# Patient Record
Sex: Female | Born: 1965 | Race: White | Hispanic: No | Marital: Married | Smoking: Former smoker
Health system: Southern US, Community
[De-identification: ages and names within clinical notes are randomized; demographics above are authoritative.]

## PROBLEM LIST (undated history)

## (undated) DIAGNOSIS — I10 Essential (primary) hypertension: Secondary | ICD-10-CM

## (undated) DIAGNOSIS — F32A Depression, unspecified: Secondary | ICD-10-CM

## (undated) DIAGNOSIS — E119 Type 2 diabetes mellitus without complications: Secondary | ICD-10-CM

## (undated) DIAGNOSIS — K219 Gastro-esophageal reflux disease without esophagitis: Secondary | ICD-10-CM

## (undated) DIAGNOSIS — N951 Menopausal and female climacteric states: Secondary | ICD-10-CM

## (undated) DIAGNOSIS — K859 Acute pancreatitis without necrosis or infection, unspecified: Secondary | ICD-10-CM

## (undated) DIAGNOSIS — M1990 Unspecified osteoarthritis, unspecified site: Secondary | ICD-10-CM

## (undated) DIAGNOSIS — F419 Anxiety disorder, unspecified: Secondary | ICD-10-CM

## (undated) DIAGNOSIS — R350 Frequency of micturition: Secondary | ICD-10-CM

## (undated) DIAGNOSIS — N3946 Mixed incontinence: Secondary | ICD-10-CM

## (undated) HISTORY — PX: APPENDECTOMY: SHX54

## (undated) HISTORY — PX: NECK SURGERY: SHX720

## (undated) HISTORY — PX: CHOLECYSTECTOMY: SHX55

## (undated) HISTORY — DX: Anxiety disorder, unspecified: F41.9

## (undated) HISTORY — PX: ABDOMINAL HYSTERECTOMY: SHX81

## (undated) HISTORY — DX: Menopausal and female climacteric states: N95.1

## (undated) HISTORY — DX: Frequency of micturition: R35.0

## (undated) HISTORY — DX: Mixed incontinence: N39.46

## (undated) HISTORY — PX: OTHER SURGICAL HISTORY: SHX169

---

## 1997-11-17 ENCOUNTER — Emergency Department (HOSPITAL_COMMUNITY): Admission: EM | Admit: 1997-11-17 | Discharge: 1997-11-17 | Payer: Self-pay

## 1997-11-20 ENCOUNTER — Encounter: Admission: RE | Admit: 1997-11-20 | Discharge: 1998-02-08 | Payer: Self-pay | Admitting: *Deleted

## 1997-12-15 ENCOUNTER — Encounter: Admission: RE | Admit: 1997-12-15 | Discharge: 1998-01-05 | Payer: Self-pay | Admitting: *Deleted

## 2001-04-16 ENCOUNTER — Emergency Department (HOSPITAL_COMMUNITY): Admission: EM | Admit: 2001-04-16 | Discharge: 2001-04-16 | Payer: Self-pay | Admitting: *Deleted

## 2001-04-16 ENCOUNTER — Encounter: Payer: Self-pay | Admitting: *Deleted

## 2001-04-19 ENCOUNTER — Inpatient Hospital Stay (HOSPITAL_COMMUNITY): Admission: RE | Admit: 2001-04-19 | Discharge: 2001-04-21 | Payer: Self-pay | Admitting: Pediatrics

## 2001-04-19 ENCOUNTER — Encounter: Payer: Self-pay | Admitting: General Surgery

## 2001-04-19 ENCOUNTER — Ambulatory Visit (HOSPITAL_COMMUNITY): Admission: RE | Admit: 2001-04-19 | Discharge: 2001-04-19 | Payer: Self-pay | Admitting: General Surgery

## 2001-05-17 ENCOUNTER — Observation Stay (HOSPITAL_COMMUNITY): Admission: RE | Admit: 2001-05-17 | Discharge: 2001-05-18 | Payer: Self-pay | Admitting: General Surgery

## 2001-07-01 ENCOUNTER — Other Ambulatory Visit: Admission: RE | Admit: 2001-07-01 | Discharge: 2001-07-01 | Payer: Self-pay | Admitting: General Surgery

## 2005-03-23 ENCOUNTER — Emergency Department (HOSPITAL_COMMUNITY): Admission: EM | Admit: 2005-03-23 | Discharge: 2005-03-23 | Payer: Self-pay | Admitting: Emergency Medicine

## 2005-03-24 ENCOUNTER — Ambulatory Visit: Payer: Self-pay | Admitting: Orthopedic Surgery

## 2005-04-07 ENCOUNTER — Ambulatory Visit: Payer: Self-pay | Admitting: Orthopedic Surgery

## 2005-05-08 ENCOUNTER — Ambulatory Visit: Payer: Self-pay | Admitting: Orthopedic Surgery

## 2005-06-11 ENCOUNTER — Ambulatory Visit: Payer: Self-pay | Admitting: Orthopedic Surgery

## 2007-03-12 ENCOUNTER — Ambulatory Visit (HOSPITAL_COMMUNITY): Admission: RE | Admit: 2007-03-12 | Discharge: 2007-03-12 | Payer: Self-pay | Admitting: Family Medicine

## 2007-03-17 ENCOUNTER — Ambulatory Visit (HOSPITAL_COMMUNITY): Admission: RE | Admit: 2007-03-17 | Discharge: 2007-03-17 | Payer: Self-pay | Admitting: Internal Medicine

## 2007-07-13 ENCOUNTER — Encounter (INDEPENDENT_AMBULATORY_CARE_PROVIDER_SITE_OTHER): Payer: Self-pay | Admitting: Interventional Radiology

## 2007-07-13 ENCOUNTER — Encounter: Admission: RE | Admit: 2007-07-13 | Discharge: 2007-07-13 | Payer: Self-pay | Admitting: Endocrinology

## 2007-07-13 ENCOUNTER — Other Ambulatory Visit: Admission: RE | Admit: 2007-07-13 | Discharge: 2007-07-13 | Payer: Self-pay | Admitting: Interventional Radiology

## 2008-05-05 ENCOUNTER — Emergency Department (HOSPITAL_COMMUNITY): Admission: EM | Admit: 2008-05-05 | Discharge: 2008-05-05 | Payer: Self-pay | Admitting: Emergency Medicine

## 2009-06-23 ENCOUNTER — Emergency Department (HOSPITAL_COMMUNITY): Admission: EM | Admit: 2009-06-23 | Discharge: 2009-06-23 | Payer: Self-pay | Admitting: Emergency Medicine

## 2010-04-15 LAB — URINALYSIS, ROUTINE W REFLEX MICROSCOPIC
Glucose, UA: NEGATIVE mg/dL
Protein, ur: NEGATIVE mg/dL
Specific Gravity, Urine: 1.01 (ref 1.005–1.030)
Urobilinogen, UA: 0.2 mg/dL (ref 0.0–1.0)

## 2010-04-15 LAB — CBC
HCT: 37.2 % (ref 36.0–46.0)
Hemoglobin: 13.2 g/dL (ref 12.0–15.0)
MCHC: 35.6 g/dL (ref 30.0–36.0)
MCV: 86 fL (ref 78.0–100.0)
Platelets: 216 10*3/uL (ref 150–400)
RDW: 14 % (ref 11.5–15.5)

## 2010-04-15 LAB — DIFFERENTIAL
Basophils Relative: 0 % (ref 0–1)
Lymphocytes Relative: 16 % (ref 12–46)
Monocytes Relative: 4 % (ref 3–12)
Neutro Abs: 8.5 10*3/uL — ABNORMAL HIGH (ref 1.7–7.7)
Neutrophils Relative %: 78 % — ABNORMAL HIGH (ref 43–77)

## 2010-04-15 LAB — COMPREHENSIVE METABOLIC PANEL
Alkaline Phosphatase: 80 U/L (ref 39–117)
BUN: 14 mg/dL (ref 6–23)
Calcium: 9.7 mg/dL (ref 8.4–10.5)
Creatinine, Ser: 0.99 mg/dL (ref 0.4–1.2)
Glucose, Bld: 139 mg/dL — ABNORMAL HIGH (ref 70–99)
Potassium: 3.8 mEq/L (ref 3.5–5.1)
Total Protein: 7.1 g/dL (ref 6.0–8.3)

## 2010-06-14 NOTE — Discharge Summary (Signed)
[facility]  Patient:    Kelli Thompson, Kelli Thompson Visit Number: [id number] MRN: [id number]          Service Type: OBV Location: 3A A327 01 Attending Physician:  Barbaraann Barthel Dictated by:   Barbaraann Barthel, M.D. Admit Date:  05/17/2001 Discharge Date: 05/18/2001   CC:         [name], M.D.   Discharge Summary  DIAGNOSIS:  Acute appendicitis.  PROCEDURE:  April 19, 2001 appendectomy.  SUMMARY:  This is a 45 year old white female who had a history of abdominal pain. She was seen in the emergency room with pain localized in the epigastrium. She had a history of cholelithiasis. She was presumed to have cholelithiasis; however, on clinical examination, her pain was located in her right lower quadrant. A CT scan revealed the presence of acute appendicitis. She had incidental cholelithiasis which would be taken care of during another hospitalization.  She was taken to surgery on April 21, 2001 at which time an acutely inflamed appendix was encountered. It was nonperforated. An appendectomy was performed unremarkably with the stapling technique.  HOSPITAL COURSE:  The patient did very well postoperatively. Her diet and activity was as tolerated. On the second postoperative day, her abdomen was soft and she was passing flatus, and she had two small BMs. Her wound was clean, she had no leg pain, or dysuria, or shortness of breath.  DISPOSITION:  She was discharged on the second postoperative day on April 21, 2001.  LABORATORY AND ACCESSORY DATA:  The patient had a mucocele of the appendix. No malignant epithelial element was identified.  She had on April 20, 2001, a white count of 6.7 with an H&H of 11.6 and 31.9. Her MET-7 was all grossly within normal limits.  DISCHARGE INSTRUCTIONS:  WORK:  She was excused from work.  DIET:  She is permitted a full liquid and soft diet.  ACTIVITY:  She is told to increase her activity as tolerated. No  sexual activity, no driving, no lifting.  WOUND CARE:  She is told to clean her wound with alcohol 3 times a day.  DISCHARGE MEDICATIONS:  She is given a prescription for Cipro 500 p.o. b.i.d. She is to resume her preoperative blood pressure medications, and take Darvocet-N 100 one tab every 4 hours as needed for pain. She is told to withhold aspirin products for 7-10 days, and take Colace 100 mg p.o. b.i.d. as needed.  FOLLOWUP:  We will follow her perioperatively after which she is to return to the medical physician of her choice as she has no assigned physician. Dictated by:   Barbaraann Barthel, M.D. Attending Physician:  Barbaraann Barthel DD:  06/08/01 TD:  06/09/01 Job: [id number]

## 2010-06-14 NOTE — Op Note (Signed)
[facility]  Patient:    EDMUND, RICK Visit Number: [id number] MRN: [id number]          Service Type: DSU Location: DAY Attending Physician:  Barbaraann Barthel Dictated by:   Barbaraann Barthel, M.D. Proc. Date: 05/17/01 Admit Date:  05/17/2001   CC:         Christin Bach, M.D.  [name], M.D.   Operative Report  SURGEON:  Barbaraann Barthel, M.D.  ASSISTANT:  Christin Bach, M.D.  PREOPERATIVE DIAGNOSIS:  Cholecystitis, cholelithiasis.  POSTOPERATIVE DIAGNOSIS:  Cholecystitis, cholelithiasis.  PROCEDURE:  Laparoscopic cholecystectomy.  INDICATIONS:  This is a 45 year old white female who came to the emergency room over a month ago with abdominal pain.  She was thought at that time to have cholecystitis having had known cholelithiasis in the past.  However, clinically she had right lower quadrant pain that was more pronounced than her right upper quadrant pain and in effect had appendicitis with mucocele noted in her appendix.  She at that time underwent an appendectomy uneventfully and now she has returned for laparoscopic cholecystectomy as he has had on and off symptoms of dyspepsia suggestive of gallbladder disease and is known to have gallstones that on sonography have increased in size.  Her liver function studies and amylase were all within normal limits.  GROSS OPERATIVE FINDINGS:  The patient had a very adherent and scarred gallbladder with two stones within it.  Right upper quadrant otherwise appeared to be normal.  She was noted to have some adhesions where her previous appendectomy took place and she was noted to have a fibroid uterus on general laparoscopic examination.  We had discussed surgery preoperatively, discussing complications, not limited to but including bleeding, infection, damage to organs, damage to bile ducts and perforation of organs and transitory diarrhea.  Informed consent was obtained.  DESCRIPTION  OF PROCEDURE:  The patient was placed in the supine position. After the adequate administration of general anesthesia via endotracheal intubation, her entire abdomen was prepped with Betadine solution and draped in the usual manner.  A periumbilical incision was carried out on the superior aspect of the umbilicus.  The patient was placed in Trendelenburg and the fascia was elevated with a sharp towel clip.  It should be stated that prior to this aseptically inserted was a Foley catheter.  A Veress needle was inserted and confirmed in position with a saline drop test, and then I used the VisiPort technique and placed an 11 mm [employer] cannula within the umbilicus incision.  Then under direct vision, three other cannulas were placed, an 11 mm cannula in the epigastrium and two 5 mm cannulas in the right upper quadrant laterally.  The gallbladder was then grasped and adhesions were tediously taken down.  The cystic duct was clearly identified and triply silver clipped on the side of the common bile duct and singly silver clipped on the side of the gallbladder.  Cystic artery was likewise triply silver clipped and divided.  Then the gallbladder was removed using the hook cautery device.  The gallbladder bed was cauterized with a minimal amount of oozing. I elected to leave a Jackson-Pratt drain within the gallbladder bed and after checking for hemostasis and irrigation, the abdomen was then desufflated and the fascia was anesthetized with 1% Xylocaine without epinephrine and the fascia, where the 11 mm cannulas were placed, was closed with O Polysorb suture.  All skin incisions were closed with a surgical clipping device. Sterile Op-Site dressings were applied.  Prior to closure, all sponge, needle and instrument counts were found to be correct.  Estimated blood loss was minimal.  No drains were placed.  The patient received 1300 cc of crystalloids intraoperatively. Dictated by:   Barbaraann Barthel, M.D. Attending Physician:  Barbaraann Barthel DD:  05/17/01 TD:  05/17/01 Job: [id number]

## 2010-06-14 NOTE — Consult Note (Signed)
[facility]  Patient:    Kelli Thompson, Kelli Thompson Visit Number: [id number] MRN: [id number]          Service Type: MED Location: 3A A304 01 Attending Physician:  Barbaraann Barthel Dictated by:   Barbaraann Barthel, M.D. Proc. Date: 04/19/01 Admit Date:  04/19/2001 Discharge Date: 04/21/2001   CC:         [name], M.D.   Consultation Report  Dear Dr. Ernestina Penna:  I saw Kelli Thompson in my office on April 19, 2001 at which time I did a complete history and physical.  In essence, the patient has been complaining of abdominal pain for the last three days.  She was seen in the emergency room Friday when her pain was primarily epigastric in nature.  On clinical examination the pain was a referred pain and she primarily had pain in the right lower quadrant.  There was some confusion as the patient was known to have gallstones by a sonogram performed in February 2002 and she does, in fact, have cholelithiasis on repeat sonogram done at this admission.  More of importance, however, is the fact that the CT scan shows that she has acute appendicitis which I suspected clinically in my office.  We will have admitted her directly and will begin hydration and antibiotic coverage and plan for laparotomy and appendectomy as soon as possible.  Thank you kindly for referring her my way. Dictated by:   Barbaraann Barthel, M.D. Attending Physician:  Barbaraann Barthel DD:  04/19/01 TD:  04/20/01 Job: [id number]

## 2010-06-14 NOTE — Op Note (Signed)
[facility]  Patient:    Kelli Thompson, Kelli Thompson Visit Number: [id number] MRN: [id number]          Service Type: MED Location: 3A A304 01 Attending Physician:  Barbaraann Barthel Dictated by:   Barbaraann Barthel, M.D. Proc. Date: 04/19/01 Admit Date:  04/19/2001   CC:         [name], M.D.   Operative Report  PREOPERATIVE DIAGNOSIS:  Acute appendicitis.  POSTOPERATIVE DIAGNOSIS:  Acute appendicitis.  PROCEDURE:  Appendectomy.  INDICATIONS:  This is a 45 year old white female who had a history of abdominal pain.  She was seen in the emergency room when pain localized in the epigastrium.  She had a history of cholelithiasis.  She was presumed to have cholelithiasis.  On follow up clinically, she appeared to have right lower quadrant pain to me primarily.  A CT scan revealed the presence of acute appendicitis.  She had incidental cholelithiasis which will be taken care of at another occasion.  Prior to surgery, we had discussed surgery in detail with the patient, discussing complications not limited to but including bleeding, infection, possibility of appendiceal leak, possibility a drain may be placed and the procedure was discussed in detail with both her and her husband and informed consent was obtained.  GROSS OPERATIVE FINDINGS:  The patient had acutely inflamed appendix with areas that appear to be dimples along the grossly inflamed appendix.  This was nonperforated.  There was edema around this area.  No other abnormalities were noted in the right lower quadrant or the terminal ileum.  DESCRIPTION OF PROCEDURE:  The patient was placed in the supine position. After the adequate administration of general anesthesia via endotracheal intubation, the entire abdomen was prepped with Betadine solution and draped in the usual manner.  Prior to this, aseptically a Foley catheter was placed. Low transverse incision was carried out over the area of  maximal tenderness by palpation.  Skin and subcutaneous tissue and the rectus muscle sheath was divided.  The rectus muscle was retracted medially.  The posterior sheath and peritoneum were grasped between two clamps and the abdominal cavity was entered and explored with the above findings.  The mesoappendix was ligated with 3-0 GI silk.  The appendix was delivered into the wound and amputated with a TA-30 stapler device.  I elected to suture the fat to buttress the appendiceal stump.  The appendiceal stump was not inverted.  The right lower quadrant was then irrigated with normal saline solution.  The surgeon and assistant changed gloves and the peritoneum was then closed with a 0 Polysorb sutures and interrupted 0 Polysorb sutures were used to close the fascia.  Subcu was irrigated.  The skin was approximated with stapling device.  Prior to closure, all sponge, needle and instrument counts were found to be correct.  Estimated blood loss minimal.  The patient received two liters of crystalloid intraoperatively.  No drains were placed and there were no complications. Dictated by:   Barbaraann Barthel, M.D. Attending Physician:  Barbaraann Barthel DD:  04/19/01 TD:  04/21/01 Job: [id number]

## 2010-07-10 ENCOUNTER — Other Ambulatory Visit (HOSPITAL_COMMUNITY): Payer: Self-pay | Admitting: "Endocrinology

## 2010-07-10 DIAGNOSIS — E049 Nontoxic goiter, unspecified: Secondary | ICD-10-CM

## 2010-07-12 ENCOUNTER — Ambulatory Visit (HOSPITAL_COMMUNITY)
Admission: RE | Admit: 2010-07-12 | Discharge: 2010-07-12 | Disposition: A | Discharge status: Home / Self Care | Payer: 59 | Source: Ambulatory Visit | Attending: "Endocrinology | Admitting: "Endocrinology

## 2010-07-12 DIAGNOSIS — E049 Nontoxic goiter, unspecified: Secondary | ICD-10-CM

## 2010-07-12 DIAGNOSIS — E042 Nontoxic multinodular goiter: Secondary | ICD-10-CM | POA: Insufficient documentation

## 2011-12-04 ENCOUNTER — Encounter (HOSPITAL_COMMUNITY): Payer: Self-pay

## 2011-12-04 ENCOUNTER — Emergency Department (HOSPITAL_COMMUNITY): Payer: 59

## 2011-12-04 ENCOUNTER — Emergency Department (HOSPITAL_COMMUNITY)
Admission: EM | Admit: 2011-12-04 | Discharge: 2011-12-04 | Disposition: A | Discharge status: Home / Self Care | Payer: 59 | Attending: Emergency Medicine | Admitting: Emergency Medicine

## 2011-12-04 DIAGNOSIS — S82892A Other fracture of left lower leg, initial encounter for closed fracture: Secondary | ICD-10-CM

## 2011-12-04 DIAGNOSIS — Z79899 Other long term (current) drug therapy: Secondary | ICD-10-CM | POA: Insufficient documentation

## 2011-12-04 DIAGNOSIS — Z87891 Personal history of nicotine dependence: Secondary | ICD-10-CM | POA: Insufficient documentation

## 2011-12-04 DIAGNOSIS — IMO0002 Reserved for concepts with insufficient information to code with codable children: Secondary | ICD-10-CM | POA: Insufficient documentation

## 2011-12-04 DIAGNOSIS — I10 Essential (primary) hypertension: Secondary | ICD-10-CM | POA: Insufficient documentation

## 2011-12-04 DIAGNOSIS — Y939 Activity, unspecified: Secondary | ICD-10-CM | POA: Insufficient documentation

## 2011-12-04 DIAGNOSIS — S82899A Other fracture of unspecified lower leg, initial encounter for closed fracture: Secondary | ICD-10-CM | POA: Insufficient documentation

## 2011-12-04 DIAGNOSIS — Y92009 Unspecified place in unspecified non-institutional (private) residence as the place of occurrence of the external cause: Secondary | ICD-10-CM | POA: Insufficient documentation

## 2011-12-04 HISTORY — DX: Essential (primary) hypertension: I10

## 2011-12-04 MED ORDER — NAPROXEN 500 MG PO TABS: 500.0000 mg | ORAL_TABLET | Freq: Two times a day (BID) | ORAL | Status: DC

## 2011-12-04 MED ORDER — HYDROCODONE-ACETAMINOPHEN 5-325 MG PO TABS
2.0000 | ORAL_TABLET | Freq: Once | ORAL | Status: AC
  Administered 2011-12-04: 2 via ORAL
  Filled 2011-12-04: qty 2

## 2011-12-04 MED ORDER — HYDROCODONE-ACETAMINOPHEN 5-325 MG PO TABS: ORAL_TABLET | ORAL | Status: DC

## 2011-12-04 NOTE — ED Notes (Signed)
Rolled my left ankle this morning per pt. Hurts to walk per pt. I have been on it all day at work per pt.

## 2011-12-04 NOTE — ED Provider Notes (Signed)
History     CSN: [id number]  Arrival date & time 12/04/11  2202   First MD Initiated Contact with Patient 12/04/11 2213      Chief Complaint  Patient presents with  . Ankle Pain    (Consider location/radiation/quality/duration/timing/severity/associated sxs/prior treatment) HPI Comments: Patient c/o pain adn swelling to her left ankle that occurred this morning after she stepped in a hole in the yard and "rolled my ankle".  States she went to work and stood up most of the day.  This evening, noticed increased pain and swelling.  She denies numbness, weakness of the leg, hip pain, head injury or LOC.    Patient is a 46 y.o. female presenting with ankle pain. The history is provided by the patient.  Ankle Pain  The incident occurred 12 to 24 hours ago. The incident occurred at home. The injury mechanism was a direct blow and a fall. The pain is present in the left ankle. The quality of the pain is described as aching. The pain is moderate. The pain has been constant since onset. Pertinent negatives include no numbness, no inability to bear weight, no loss of motion, no muscle weakness, no loss of sensation and no tingling. She reports no foreign bodies present. The symptoms are aggravated by bearing weight and palpation. She has tried nothing for the symptoms. The treatment provided no relief.    Past Medical History  Diagnosis Date  . Hypertension     Past Surgical History  Procedure Date  . Cesarean section   . Cholecystectomy   . Appendectomy   . Abdominal hysterectomy     No family history on file.  History  Substance Use Topics  . Smoking status: Former Games developer  . Smokeless tobacco: Not on file  . Alcohol Use: No    OB History    Grav Para Term Preterm Abortions TAB SAB Ect Mult Living                  Review of Systems  Constitutional: Negative for fever and chills.  Respiratory: Negative for chest tightness and shortness of breath.   Genitourinary: Negative  for dysuria and difficulty urinating.  Musculoskeletal: Positive for joint swelling and arthralgias.  Skin: Negative for color change and wound.  Neurological: Negative for dizziness, tingling, weakness and numbness.  All other systems reviewed and are negative.    Allergies  Review of patient's allergies indicates no known allergies.  Home Medications   Current Outpatient Rx  Name  Route  Sig  Dispense  Refill  . LISINOPRIL-HYDROCHLOROTHIAZIDE 20-12.5 MG PO TABS   Oral   Take 1 tablet by mouth 2 (two) times daily.           BP 127/85  Pulse 101  Temp 99.1 F (37.3 C)  Resp 18  Ht 5\' 5"  (1.651 m)  Wt 220 lb (99.791 kg)  BMI 36.61 kg/m2  SpO2 100%  Physical Exam  Nursing note and vitals reviewed. Constitutional: She is oriented to person, place, and time. She appears well-developed and well-nourished. No distress.  HENT:  Head: Normocephalic and atraumatic.  Cardiovascular: Normal rate, regular rhythm, normal heart sounds and intact distal pulses.   Pulmonary/Chest: Effort normal and breath sounds normal.  Musculoskeletal: She exhibits edema and tenderness.       Left ankle: She exhibits swelling. She exhibits normal range of motion, no ecchymosis, no deformity, no laceration and normal pulse. tenderness. Lateral malleolus and medial malleolus tenderness found. No head of 5th  metatarsal and no proximal fibula tenderness found. Achilles tendon normal.       Feet:       Left ankle is ttp at the medial and lateral aspect, mild STS is present.  ROM is preserved.  DP pulse is brisk, distal sensation intact.  Mild bruising also present.  No proximal tenderness  Neurological: She is alert and oriented to person, place, and time. She exhibits normal muscle tone. Coordination normal.  Skin: Skin is warm and dry.    ED Course  Procedures (including critical care time)  Labs Reviewed - No data to display No results found.  Dg Ankle Complete Left  12/04/2011  *RADIOLOGY  REPORT*  Clinical Data: Status post fall; left lateral ankle pain and swelling.  LEFT ANKLE COMPLETE - 3+ VIEW  Comparison: None.  Findings: An osseous fragment at the distal tip of the medial malleolus could reflect an acute fracture; suggest clinical correlation for associated symptoms.  The lateral malleolus appears intact.  A tiny osseous fragment adjacent to the distal tip of the fibula is thought to be degenerative in nature.  Soft tissue swelling is noted overlying the distal fibula.  The ankle mortise appears intact; the interosseous space is within normal limits.  Plantar and posterior calcaneal spurs are incidentally seen.  Visualized joint spaces are preserved.  IMPRESSION: Small osseous fragment at the distal tip of the medial malleolus could reflect an acute fracture; suggest clinical correlation for associated medial symptoms.  The lateral malleolus appears intact. Tiny osseous fragment adjacent to the distal tip of the fibula is thought to be degenerative in nature.   Original Report Authenticated By: Tonia Ghent, M.D.       MDM      ASO splint applied, crutches given.  Pain improved.  Remains NV intact.    Pt agrees to RICE therapy, close f/u with Dr. Romeo Apple.    Prescribed: norco #20 naprosyn  Jeni Duling L. [place], [place] 12/04/11 2334

## 2011-12-05 NOTE — ED Provider Notes (Signed)
Medical screening examination/treatment/procedure(s) were performed by non-physician practitioner and as supervising physician I was immediately available for consultation/collaboration.  Geoffery Lyons, MD 12/05/11 [phone]

## 2011-12-09 ENCOUNTER — Ambulatory Visit (INDEPENDENT_AMBULATORY_CARE_PROVIDER_SITE_OTHER): Payer: 59 | Admitting: Orthopedic Surgery

## 2011-12-09 ENCOUNTER — Encounter: Payer: Self-pay | Admitting: Orthopedic Surgery

## 2011-12-09 VITALS — Ht 65.0 in | Wt 218.0 lb

## 2011-12-09 DIAGNOSIS — S93409A Sprain of unspecified ligament of unspecified ankle, initial encounter: Secondary | ICD-10-CM

## 2011-12-09 NOTE — Patient Instructions (Addendum)
Walking boot weight bearing as tolerated.  OOW note : RTW 22nd   Continue naproxen and hydrocodone

## 2011-12-09 NOTE — Progress Notes (Signed)
Patient ID: Kelli Thompson, female   DOB: 1965/01/31, 46 y.o.   MRN: [id number] Chief Complaint  Patient presents with  . Ankle Pain    Left ankle pain, DOI  12-04-11.    Left ankle sprain  Emergency room visit, x-rays, naproxen 500, hydrocodone 5 mg, crutches.  Mechanism of injury rolled the ankle after her dog got loose.  Date of injury November 7  Toe pain  7/10 pain  Pain comes and goes  History of rolling the ankle  Associated signs and symptoms bruising or tingling and swelling  History of snoring and muscle pain otherwise review of systems negative  Left ankle there tender medial and lateral. Range of motion passively normal. Stability test drawer test grade 1 laxity. Strength normal. Skin normal. Pulses normal.  X-ray bone chips medially no fracture laterally  Impression ankle sprain  Short Cam Walker weightbearing as tolerated out of work until the 21st  Reevaluate the 21st  Continue naproxen and hydrocodone

## 2011-12-18 ENCOUNTER — Encounter: Payer: Self-pay | Admitting: Orthopedic Surgery

## 2011-12-18 ENCOUNTER — Ambulatory Visit (INDEPENDENT_AMBULATORY_CARE_PROVIDER_SITE_OTHER): Payer: 59 | Admitting: Orthopedic Surgery

## 2011-12-18 DIAGNOSIS — S93409A Sprain of unspecified ligament of unspecified ankle, initial encounter: Secondary | ICD-10-CM

## 2011-12-18 MED ORDER — HYDROCODONE-ACETAMINOPHEN 5-325 MG PO TABS: ORAL_TABLET | ORAL | Status: DC

## 2011-12-18 MED ORDER — NAPROXEN 500 MG PO TABS: 500.0000 mg | ORAL_TABLET | Freq: Two times a day (BID) | ORAL | Status: DC

## 2011-12-18 NOTE — Patient Instructions (Signed)
Wear ankle brace x 4 weeks

## 2011-12-18 NOTE — Progress Notes (Signed)
Patient ID: Kelli Thompson, female   DOB: September 13, 1965, 46 y.o.   MRN: [id number] Chief Complaint  Patient presents with  . Follow-up    ankle sprain  left/nov 7 /     1. Ankle sprain  naproxen (NAPROSYN) 500 MG tablet, HYDROcodone-acetaminophen (NORCO/VICODIN) 5-325 MG per tablet   Ros: negative MSK   Physical Exam(6) GENERAL: normal development   CDV: pulses are normal   Skin: normal  Psychiatric: awake, alert and oriented  Neuro: normal sensation  MSK exam of left ankle. The patient ambulated without a limp she can't open her 1 foot with minimal discomfort. She does have some laxity with anterior drawer maneuver. Some pain with eversion and inversion but has normal active range of motion and strength. Assessment: Resolving ankle sprain    Plan: Brace x4 weeks continue medication return to work Monday follow up as needed

## 2013-03-25 ENCOUNTER — Encounter (HOSPITAL_COMMUNITY): Payer: Self-pay | Admitting: Emergency Medicine

## 2013-03-25 ENCOUNTER — Emergency Department (HOSPITAL_COMMUNITY)
Admission: EM | Admit: 2013-03-25 | Discharge: 2013-03-25 | Disposition: A | Discharge status: Home / Self Care | Payer: Managed Care, Other (non HMO) | Attending: Emergency Medicine | Admitting: Emergency Medicine

## 2013-03-25 DIAGNOSIS — Z79899 Other long term (current) drug therapy: Secondary | ICD-10-CM | POA: Insufficient documentation

## 2013-03-25 DIAGNOSIS — R599 Enlarged lymph nodes, unspecified: Secondary | ICD-10-CM | POA: Insufficient documentation

## 2013-03-25 DIAGNOSIS — H6690 Otitis media, unspecified, unspecified ear: Secondary | ICD-10-CM | POA: Insufficient documentation

## 2013-03-25 DIAGNOSIS — H6692 Otitis media, unspecified, left ear: Secondary | ICD-10-CM

## 2013-03-25 DIAGNOSIS — I10 Essential (primary) hypertension: Secondary | ICD-10-CM | POA: Insufficient documentation

## 2013-03-25 DIAGNOSIS — Z87891 Personal history of nicotine dependence: Secondary | ICD-10-CM | POA: Insufficient documentation

## 2013-03-25 MED ORDER — AMOXICILLIN 500 MG PO CAPS: 500.0000 mg | ORAL_CAPSULE | Freq: Three times a day (TID) | ORAL | Status: DC

## 2013-03-25 MED ORDER — ANTIPYRINE-BENZOCAINE 5.4-1.4 % OT SOLN
3.0000 [drp] | Freq: Once | OTIC | Status: AC
  Administered 2013-03-25: 4 [drp] via OTIC
  Filled 2013-03-25: qty 10

## 2013-03-25 NOTE — ED Notes (Signed)
Pain lt ear, onset 3 days ago with swollen nodes behind ear.  No sore throat, , no nasal congestion. Says she used sweet oil in ear without relief

## 2013-03-25 NOTE — Discharge Instructions (Signed)
1. Medications: Amoxicillin, Auralgan, usual home medications 2. Treatment: rest, drink plenty of fluids, complete your course of antibiotics 3. Follow Up: Please followup with your primary doctor for discussion of your diagnoses and further evaluation after today's visit; please also discussed your hypertension.  Otitis Media, Adult Otitis media is redness, soreness, and swelling (inflammation) of the middle ear. Otitis media may be caused by allergies or, most commonly, by infection. Often it occurs as a complication of the common cold. SIGNS AND SYMPTOMS Symptoms of otitis media may include:  Earache.  Fever.  Ringing in your ear.  Headache.  Leakage of fluid from the ear. DIAGNOSIS To diagnose otitis media, your health care provider will examine your ear with an otoscope. This is an instrument that allows your health care provider to see into your ear in order to examine your eardrum. Your health care provider also will ask you questions about your symptoms. TREATMENT  Typically, otitis media resolves on its own within 3 5 days. Your health care provider may prescribe medicine to ease your symptoms of pain. If otitis media does not resolve within 5 days or is recurrent, your health care provider may prescribe antibiotic medicines if he or she suspects that a bacterial infection is the cause. HOME CARE INSTRUCTIONS   Take your medicine as directed until it is gone, even if you feel better after the first few days.  Only take over-the-counter or prescription medicines for pain, discomfort, or fever as directed by your health care provider.  Follow up with your health care provider as directed. SEEK MEDICAL CARE IF:  You have otitis media only in one ear or bleeding from your nose or both.  You notice a lump on your neck.  You are not getting better in 3 5 days.  You feel worse instead of better. SEEK IMMEDIATE MEDICAL CARE IF:   You have pain that is not controlled with  medicine.  You have swelling, redness, or pain around your ear or stiffness in your neck.  You notice that part of your face is paralyzed.  You notice that the bone behind your ear (mastoid) is tender when you touch it. MAKE SURE YOU:   Understand these instructions.  Will watch your condition.  Will get help right away if you are not doing well or get worse. Document Released: 10/19/2003 Document Revised: 11/03/2012 Document Reviewed: 08/10/2012 [facility] Patient Information 2014 [place], [place].

## 2013-03-25 NOTE — ED Provider Notes (Signed)
Medical screening examination/treatment/procedure(s) were performed by non-physician practitioner and as supervising physician I was immediately available for consultation/collaboration.   EKG Interpretation None       Nat Christen, MD 03/25/13 2159

## 2013-03-25 NOTE — ED Provider Notes (Signed)
CSN: [id number]     Arrival date & time 03/25/13  1818 History   First MD Initiated Contact with Patient 03/25/13 1928     Chief Complaint  Patient presents with  . Otalgia     (Consider location/radiation/quality/duration/timing/severity/associated sxs/prior Treatment) Patient is a 48 y.o. female presenting with ear pain. The history is provided by the patient, the spouse and medical records. No language interpreter was used.  Otalgia Associated symptoms: no abdominal pain, no cough, no diarrhea, no fever, no headaches, no rash and no vomiting     Kelli Thompson is a 48 y.o. female  with a hx of HTN presents to the Emergency Department complaining of gradual, persistent, progressively worsening L otalgia onset 2 days ago with associated swelling in the left ear. Patient reports using peroxide and sweet oil to cure the pain which did not help. Nothing makes it better. She denies fever, chills, headache, neck pain, chest pain, shortness of breath, abdominal pain, nausea, vomiting, chest congestion, nasal congestion.  She specifically denies sore throat but reports that this increases the pain in her left ear.    She reports several adjustments to her blood pressure medication in the last few months but she feels as if they are not working. She reports she plans to see her primary care next week for further evaluation of her high blood pressure.  Past Medical History  Diagnosis Date  . Hypertension    Past Surgical History  Procedure Laterality Date  . Cesarean section    . Cholecystectomy    . Appendectomy    . Abdominal hysterectomy     Family History  Problem Relation Age of Onset  . Arthritis    . Diabetes     History  Substance Use Topics  . Smoking status: Former Research scientist (life sciences)  . Smokeless tobacco: Not on file  . Alcohol Use: No   OB History   Grav Para Term Preterm Abortions TAB SAB Ect Mult Living                 Review of Systems  Constitutional: Negative for fever,  diaphoresis, appetite change, fatigue and unexpected weight change.  HENT: Positive for ear pain. Negative for mouth sores.   Respiratory: Negative for cough, chest tightness, shortness of breath and wheezing.   Cardiovascular: Negative for chest pain.  Gastrointestinal: Negative for nausea, vomiting, abdominal pain, diarrhea and constipation.  Musculoskeletal: Negative for back pain and neck stiffness.  Skin: Negative for rash.  Allergic/Immunologic: Negative for immunocompromised state.  Neurological: Negative for syncope, light-headedness and headaches.  Hematological: Positive for adenopathy. Does not bruise/bleed easily.  Psychiatric/Behavioral: Negative for sleep disturbance. The patient is not nervous/anxious.       Allergies  Review of patient's allergies indicates no known allergies.  Home Medications   Current Outpatient Rx  Name  Route  Sig  Dispense  Refill  . lisinopril-hydrochlorothiazide (PRINZIDE,ZESTORETIC) 20-25 MG per tablet   Oral   Take 1 tablet by mouth daily.         Marland Kitchen amoxicillin (AMOXIL) 500 MG capsule   Oral   Take 1 capsule (500 mg total) by mouth 3 (three) times daily.   21 capsule   0    BP 153/97  Pulse 70  Temp(Src) 98.4 F (36.9 C) (Oral)  Resp 18  Ht 5\' 5"  (1.651 m)  Wt 210 lb (95.255 kg)  BMI 34.95 kg/m2  SpO2 100% Physical Exam  Constitutional: She is oriented to person, place, and time.  She appears well-developed and well-nourished. No distress.  HENT:  Head: Normocephalic and atraumatic.  Right Ear: Tympanic membrane, external ear and ear canal normal.  Left Ear: External ear and ear canal normal. Tympanic membrane is injected and bulging.  Nose: Nose normal. No mucosal edema or rhinorrhea. No epistaxis. Right sinus exhibits no maxillary sinus tenderness and no frontal sinus tenderness. Left sinus exhibits no maxillary sinus tenderness and no frontal sinus tenderness.  Mouth/Throat: Uvula is midline, oropharynx is clear and moist  and mucous membranes are normal. Mucous membranes are not pale and not cyanotic. No uvula swelling. No oropharyngeal exudate, posterior oropharyngeal edema, posterior oropharyngeal erythema or tonsillar abscesses.  Left tympanic membrane injected and bulging No erythema, edema or exudate the oropharynx Moist mucous membranes  Eyes: Conjunctivae are normal. Pupils are equal, round, and reactive to light.  Neck: Normal range of motion and full passive range of motion without pain. Neck supple. No spinous process tenderness and no muscular tenderness present. No rigidity. Normal range of motion present. No Brudzinski's sign and no Kernig's sign noted.  Full Range of motion without pain No midline or paraspinal tenderness No nuchal rigidity  Cardiovascular: Normal rate, regular rhythm, normal heart sounds and intact distal pulses.   No murmur heard. Regular rate and rhythm  Pulmonary/Chest: Effort normal and breath sounds normal. No stridor. No respiratory distress.  Clear and equal breath sounds  Abdominal: Soft. Bowel sounds are normal. There is no tenderness.  Soft and nontender abdomen  Musculoskeletal: Normal range of motion.  Lymphadenopathy:       Head (right side): No submental, no submandibular, no tonsillar, no preauricular, no posterior auricular and no occipital adenopathy present.       Head (left side): Tonsillar and posterior auricular adenopathy present. No submental, no submandibular, no preauricular and no occipital adenopathy present.    She has no cervical adenopathy.       Right cervical: No superficial cervical, no deep cervical and no posterior cervical adenopathy present.      Left cervical: No superficial cervical, no deep cervical and no posterior cervical adenopathy present.       Right: No supraclavicular adenopathy present.       Left: No supraclavicular adenopathy present.  Left tonsillar and posterior regular lymphadenopathy - discrete, tender and mobile; no other  head or cervical adenopathy  Neurological: She is alert and oriented to person, place, and time. She exhibits normal muscle tone. Coordination normal.  Skin: Skin is warm and dry. No rash noted. She is not diaphoretic.  Psychiatric: She has a normal mood and affect.    ED Course  Procedures (including critical care time) Labs Review Labs Reviewed - No data to display Imaging Review No results found.  @NOMUSE @  MDM   Final diagnoses:  Acute left otitis media   Saraih W Greenlaw presents with otalgia and exam consistent with acute otitis media. No concern for acute mastoiditis, meningitis.  No antibiotic use in the last month.  Patient discharged home with Amoxicillin.    Patient noted to be hypertensive in the emergency department.  No signs of hypertensive urgency.  Discussed with patient the need for close follow-up and management by their primary care physician.   Advised patient to set up appointment next week for recheck of her blood pressure and her ear.  I have also discussed reasons to return immediately to the ER.  Parent expresses understanding and agrees with plan.  It has been determined that no acute conditions  requiring further emergency intervention are present at this time. The patient/guardian have been advised of the diagnosis and plan. We have discussed signs and symptoms that warrant return to the ED, such as changes or worsening in symptoms.   Vital signs are stable at discharge.   BP 153/97  Pulse 70  Temp(Src) 98.4 F (36.9 C) (Oral)  Resp 18  Ht 5\' 5"  (1.651 m)  Wt 210 lb (95.255 kg)  BMI 34.95 kg/m2  SpO2 100%  Patient/guardian has voiced understanding and agreed to follow-up with the PCP or specialist.        Abigail Butts, PA-C 03/25/13 2008

## 2013-03-25 NOTE — ED Notes (Signed)
Complain of pain in left ear

## 2013-04-15 ENCOUNTER — Emergency Department (HOSPITAL_COMMUNITY)
Admission: EM | Admit: 2013-04-15 | Discharge: 2013-04-16 | Disposition: A | Discharge status: Home / Self Care | Payer: Managed Care, Other (non HMO) | Attending: Emergency Medicine | Admitting: Emergency Medicine

## 2013-04-15 DIAGNOSIS — N390 Urinary tract infection, site not specified: Secondary | ICD-10-CM

## 2013-04-15 DIAGNOSIS — K859 Acute pancreatitis without necrosis or infection, unspecified: Secondary | ICD-10-CM | POA: Insufficient documentation

## 2013-04-15 DIAGNOSIS — Z79899 Other long term (current) drug therapy: Secondary | ICD-10-CM | POA: Insufficient documentation

## 2013-04-15 DIAGNOSIS — I10 Essential (primary) hypertension: Secondary | ICD-10-CM | POA: Insufficient documentation

## 2013-04-15 DIAGNOSIS — M546 Pain in thoracic spine: Secondary | ICD-10-CM | POA: Insufficient documentation

## 2013-04-15 DIAGNOSIS — M549 Dorsalgia, unspecified: Secondary | ICD-10-CM

## 2013-04-15 DIAGNOSIS — Z87891 Personal history of nicotine dependence: Secondary | ICD-10-CM | POA: Insufficient documentation

## 2013-04-15 NOTE — ED Notes (Signed)
PT STATES SHE MOPPED HER SHOP FLOOR ON MONDAY AND HER MID BACK STARTED HURTING ON TUESDAY AND HAS GOTTEN PROGRESSIVELY WORSE. PT STATES THE PAIN IS MAKING HER NAUSEATED.

## 2013-04-16 ENCOUNTER — Encounter (HOSPITAL_COMMUNITY): Payer: Self-pay | Admitting: Emergency Medicine

## 2013-04-16 LAB — URINALYSIS, ROUTINE W REFLEX MICROSCOPIC
BILIRUBIN URINE: NEGATIVE
GLUCOSE, UA: NEGATIVE mg/dL
HGB URINE DIPSTICK: NEGATIVE
Ketones, ur: NEGATIVE mg/dL
Nitrite: NEGATIVE
PH: 6.5 (ref 5.0–8.0)
Protein, ur: NEGATIVE mg/dL
SPECIFIC GRAVITY, URINE: 1.015 (ref 1.005–1.030)
UROBILINOGEN UA: 1 mg/dL (ref 0.0–1.0)

## 2013-04-16 LAB — COMPREHENSIVE METABOLIC PANEL
ALT: 17 U/L (ref 0–35)
AST: 18 U/L (ref 0–37)
Albumin: 3.9 g/dL (ref 3.5–5.2)
Alkaline Phosphatase: 85 U/L (ref 39–117)
BUN: 13 mg/dL (ref 6–23)
CO2: 30 mEq/L (ref 19–32)
CREATININE: 1.04 mg/dL (ref 0.50–1.10)
Calcium: 9.5 mg/dL (ref 8.4–10.5)
Chloride: 100 mEq/L (ref 96–112)
GFR calc Af Amer: 73 mL/min — ABNORMAL LOW (ref >90)
GFR, EST NON AFRICAN AMERICAN: 63 mL/min — AB (ref >90)
Glucose, Bld: 129 mg/dL — ABNORMAL HIGH (ref 70–99)
Potassium: 4.3 mEq/L (ref 3.7–5.3)
Sodium: 140 mEq/L (ref 137–147)
Total Bilirubin: 0.3 mg/dL (ref 0.3–1.2)
Total Protein: 7.2 g/dL (ref 6.0–8.3)

## 2013-04-16 LAB — CBC WITH DIFFERENTIAL/PLATELET
BASOS ABS: 0 10*3/uL (ref 0.0–0.1)
BASOS PCT: 0 % (ref 0–1)
EOS ABS: 0.3 10*3/uL (ref 0.0–0.7)
Eosinophils Relative: 3 % (ref 0–5)
HCT: 38.4 % (ref 36.0–46.0)
Hemoglobin: 13.3 g/dL (ref 12.0–15.0)
Lymphocytes Relative: 18 % (ref 12–46)
Lymphs Abs: 1.8 10*3/uL (ref 0.7–4.0)
MCH: 30.3 pg (ref 26.0–34.0)
MCHC: 34.6 g/dL (ref 30.0–36.0)
MCV: 87.5 fL (ref 78.0–100.0)
Monocytes Absolute: 0.7 10*3/uL (ref 0.1–1.0)
Monocytes Relative: 7 % (ref 3–12)
NEUTROS PCT: 71 % (ref 43–77)
Neutro Abs: 7.3 10*3/uL (ref 1.7–7.7)
Platelets: 208 10*3/uL (ref 150–400)
RBC: 4.39 MIL/uL (ref 3.87–5.11)
RDW: 13.1 % (ref 11.5–15.5)
WBC: 10.2 10*3/uL (ref 4.0–10.5)

## 2013-04-16 LAB — URINE MICROSCOPIC-ADD ON

## 2013-04-16 LAB — LIPASE, BLOOD: LIPASE: 82 U/L — AB (ref 11–59)

## 2013-04-16 MED ORDER — ONDANSETRON 8 MG PO TBDP
8.0000 mg | ORAL_TABLET | Freq: Once | ORAL | Status: AC
  Administered 2013-04-16: 8 mg via ORAL
  Filled 2013-04-16: qty 1

## 2013-04-16 MED ORDER — CEPHALEXIN 500 MG PO CAPS: 500.0000 mg | ORAL_CAPSULE | Freq: Four times a day (QID) | ORAL | Status: DC

## 2013-04-16 MED ORDER — OXYCODONE-ACETAMINOPHEN 5-325 MG PO TABS
2.0000 | ORAL_TABLET | Freq: Once | ORAL | Status: AC
  Administered 2013-04-16: 2 via ORAL
  Filled 2013-04-16: qty 2

## 2013-04-16 MED ORDER — OXYCODONE-ACETAMINOPHEN 5-325 MG PO TABS: 2.0000 | ORAL_TABLET | ORAL | Status: DC | PRN

## 2013-04-16 NOTE — Discharge Instructions (Signed)
°

## 2013-04-16 NOTE — ED Provider Notes (Signed)
CSN: [id number]     Arrival date & time 04/15/13  2342 History  This chart was scribed for Kelli Cable, MD by Elby Beck, ED Scribe. This patient was seen in room APA01/APA01 and the patient's care was started at 12:18 AM.   Chief Complaint  Patient presents with  . Back Pain    Patient is a 48 y.o. female presenting with back pain. The history is provided by the patient. No language interpreter was used.  Back Pain Location:  Thoracic spine Quality:  Unable to specify Radiates to: abdomen. Pain severity:  Moderate Onset quality:  Gradual Duration:  4 days Timing:  Intermittent Progression:  Worsening Chronicity:  New Context: physical stress (mopped a floor 4 days ago, at which time she believed she pulled a muscle in her back)   Context: not falling   Relieved by:  Nothing Worsened by:  Nothing tried Ineffective treatments:  Ibuprofen Associated symptoms: abdominal pain   Associated symptoms: no chest pain, no dysuria, no fever and no weakness     HPI Comments: Kelli Thompson is a 48 y.o. female who presents to the Emergency Department complaining of progressively worsening middle back pain that radiates to her abdomen over the past 4 days. She states that she mopped a floor 4 days ago, and that she believes she pulled a muscle at that time. She denies any falls or any other trauma to have onset her back pain. She reports associated nausea secondary to this pain. She reports an abdominal surgical history of appendectomy, cholecystectomy or hysterectomy. She denies any history of back surgery. She denies any history of GI ulcers. She states that she takes Ibuprofen occasionally- as she has for this pain without relief- but she does not use NSAIDs every day. She denies fever, vomiting, chest pain, SOB, cough, dysuria, urinary frequency, new weakness in her arms or legs,denies blood in stool or any other symptoms.   Past Medical History  Diagnosis Date  . Hypertension     Past Surgical History  Procedure Laterality Date  . Cesarean section    . Cholecystectomy    . Appendectomy    . Abdominal hysterectomy     Family History  Problem Relation Age of Onset  . Arthritis    . Diabetes     History  Substance Use Topics  . Smoking status: Former Research scientist (life sciences)  . Smokeless tobacco: Not on file  . Alcohol Use: No   OB History   Grav Para Term Preterm Abortions TAB SAB Ect Mult Living                 Review of Systems  Constitutional: Negative for fever.  Respiratory: Negative for cough and shortness of breath.   Cardiovascular: Negative for chest pain.  Gastrointestinal: Positive for nausea and abdominal pain. Negative for vomiting and blood in stool.  Genitourinary: Negative for dysuria and frequency.  Musculoskeletal: Positive for back pain.  Neurological: Negative for weakness.  All other systems reviewed and are negative.   Allergies  Review of patient's allergies indicates no known allergies.  Home Medications   Current Outpatient Rx  Name  Route  Sig  Dispense  Refill  . amoxicillin (AMOXIL) 500 MG capsule   Oral   Take 1 capsule (500 mg total) by mouth 3 (three) times daily.   21 capsule   0   . cephALEXin (KEFLEX) 500 MG capsule   Oral   Take 1 capsule (500 mg total) by mouth 4 (four)  times daily.   28 capsule   0   . lisinopril-hydrochlorothiazide (PRINZIDE,ZESTORETIC) 20-25 MG per tablet   Oral   Take 1 tablet by mouth daily.         Marland Kitchen oxyCODONE-acetaminophen (PERCOCET/ROXICET) 5-325 MG per tablet   Oral   Take 2 tablets by mouth every 4 (four) hours as needed for severe pain.   15 tablet   0    Triage Vitals: BP 124/80  Pulse 116  Temp(Src) 98.9 F (37.2 C) (Oral)  Resp 20  Ht 5\' 5"  (1.651 m)  Wt 210 lb (95.255 kg)  BMI 34.95 kg/m2  SpO2 95%  Physical Exam  Nursing note and vitals reviewed. CONSTITUTIONAL: Well developed/well nourished HEAD: Normocephalic/atraumatic EYES: EOMI/PERRL, no icterus ENMT:  Mucous membranes moist NECK: supple no meningeal signs SPINE:entire spine nontender, thoracic paraspinal tenderness CV: S1/S2 noted, no murmurs/rubs/gallops noted LUNGS: Lungs are clear to auscultation bilaterally, no apparent distress ABDOMEN: soft, mild epigastric tenderness, no rebound or guarding GU:no cva tenderness NEURO: Awake/alert, equal motor 5/5 strength noted with the following: hip flexion/knee flexion/extension, foot dorsi/plantar flexion, great toe extension intact bilaterally, no clonus bilaterally, plantar reflex appropriate (toes downgoing), no sensory deficit in any dermatome.  Equal patellar/achilles reflex noted (2+) in bilateral lower extremities. EXTREMITIES: pulses normal, full ROM SKIN: warm, color normal PSYCH: no abnormalities of mood noted   ED Course  Procedures   DIAGNOSTIC STUDIES: Oxygen Saturation is 95% on RA, adequate by my interpretation.    COORDINATION OF CARE: 12:24 AM- discussed plan to obtain diagnostic lab work. Will also order medications. Pt advised of plan for treatment and pt agrees.  2:31 AM Pt feels improved.  She is watching videos on phone BP 124/80  Pulse 74  Temp(Src) 98.9 F (37.2 C) (Oral)  Resp 20  Ht 5\' 5"  (1.651 m)  Wt 210 lb (95.255 kg)  BMI 34.95 kg/m2  SpO2 96% We discussed results - mild pancreatitis but denies ETOH use and s/p cholecystectomy years ago She is clinically nontoxic, not vomiting, I feel this can be managed/evaluated as outpatient   UTI noted.  She reports some recent flank pain, will treat with antibiotic  We discussed strict return precautions at time of discharge  Medications  oxyCODONE-acetaminophen (PERCOCET/ROXICET) 5-325 MG per tablet 2 tablet (2 tablets Oral Given 04/16/13 0032)  ondansetron (ZOFRAN-ODT) disintegrating tablet 8 mg (8 mg Oral Given 04/16/13 0033)   Labs Review Labs Reviewed  URINALYSIS, ROUTINE W REFLEX MICROSCOPIC - Abnormal; Notable for the following:    Leukocytes, UA  LARGE (*)    All other components within normal limits  COMPREHENSIVE METABOLIC PANEL - Abnormal; Notable for the following:    Glucose, Bld 129 (*)    GFR calc non Af Amer 63 (*)    GFR calc Af Amer 73 (*)    All other components within normal limits  LIPASE, BLOOD - Abnormal; Notable for the following:    Lipase 82 (*)    All other components within normal limits  URINE MICROSCOPIC-ADD ON - Abnormal; Notable for the following:    Squamous Epithelial / LPF MANY (*)    Bacteria, UA MANY (*)    All other components within normal limits  CBC WITH DIFFERENTIAL     EKG Interpretation   Date/Time:  Saturday April 16 2013 00:40:21 EDT Ventricular Rate:  86 PR Interval:  152 QRS Duration: 90 QT Interval:  396 QTC Calculation: 473 R Axis:   58 Text Interpretation:  Normal sinus rhythm Normal  ECG No previous ECGs  available Confirmed by Christy Gentles  MD, Marcy Sookdeo ([id number]) on 04/16/2013 12:50:55  AM      MDM   Final diagnoses:  Pancreatitis  UTI (lower urinary tract infection)  Back pain    Nursing notes including past medical history and social history reviewed and considered in documentation Labs/vital reviewed and considered    I personally performed the services described in this documentation, which was scribed in my presence. The recorded information has been reviewed and is accurate.     Kelli Cable, MD 04/16/13 [phone]

## 2013-04-17 ENCOUNTER — Inpatient Hospital Stay (HOSPITAL_COMMUNITY)
Admission: EM | Admit: 2013-04-17 | Discharge: 2013-04-19 | DRG: 439 | Disposition: A | Discharge status: Home / Self Care | Payer: Managed Care, Other (non HMO) | Attending: Internal Medicine | Admitting: Internal Medicine

## 2013-04-17 ENCOUNTER — Encounter (HOSPITAL_COMMUNITY): Payer: Self-pay | Admitting: Emergency Medicine

## 2013-04-17 ENCOUNTER — Emergency Department (HOSPITAL_COMMUNITY): Payer: Managed Care, Other (non HMO)

## 2013-04-17 DIAGNOSIS — Z8249 Family history of ischemic heart disease and other diseases of the circulatory system: Secondary | ICD-10-CM

## 2013-04-17 DIAGNOSIS — Z833 Family history of diabetes mellitus: Secondary | ICD-10-CM

## 2013-04-17 DIAGNOSIS — I10 Essential (primary) hypertension: Secondary | ICD-10-CM | POA: Diagnosis present

## 2013-04-17 DIAGNOSIS — E876 Hypokalemia: Secondary | ICD-10-CM

## 2013-04-17 DIAGNOSIS — M949 Disorder of cartilage, unspecified: Secondary | ICD-10-CM

## 2013-04-17 DIAGNOSIS — E669 Obesity, unspecified: Secondary | ICD-10-CM | POA: Diagnosis present

## 2013-04-17 DIAGNOSIS — N390 Urinary tract infection, site not specified: Secondary | ICD-10-CM | POA: Diagnosis present

## 2013-04-17 DIAGNOSIS — Z87891 Personal history of nicotine dependence: Secondary | ICD-10-CM

## 2013-04-17 DIAGNOSIS — S93409A Sprain of unspecified ligament of unspecified ankle, initial encounter: Secondary | ICD-10-CM

## 2013-04-17 DIAGNOSIS — Z6834 Body mass index (BMI) 34.0-34.9, adult: Secondary | ICD-10-CM

## 2013-04-17 DIAGNOSIS — Z9089 Acquired absence of other organs: Secondary | ICD-10-CM

## 2013-04-17 DIAGNOSIS — K859 Acute pancreatitis without necrosis or infection, unspecified: Principal | ICD-10-CM | POA: Diagnosis present

## 2013-04-17 DIAGNOSIS — M899 Disorder of bone, unspecified: Secondary | ICD-10-CM | POA: Diagnosis present

## 2013-04-17 LAB — URINALYSIS, ROUTINE W REFLEX MICROSCOPIC
GLUCOSE, UA: NEGATIVE mg/dL
Hgb urine dipstick: NEGATIVE
Ketones, ur: NEGATIVE mg/dL
LEUKOCYTES UA: NEGATIVE
Nitrite: NEGATIVE
Protein, ur: NEGATIVE mg/dL
Specific Gravity, Urine: 1.02 (ref 1.005–1.030)
UROBILINOGEN UA: 1 mg/dL (ref 0.0–1.0)
pH: 6 (ref 5.0–8.0)

## 2013-04-17 LAB — CBC WITH DIFFERENTIAL/PLATELET
Basophils Absolute: 0 10*3/uL (ref 0.0–0.1)
Basophils Relative: 0 % (ref 0–1)
EOS ABS: 0.3 10*3/uL (ref 0.0–0.7)
Eosinophils Relative: 2 % (ref 0–5)
HCT: 41.5 % (ref 36.0–46.0)
HEMOGLOBIN: 14.6 g/dL (ref 12.0–15.0)
Lymphocytes Relative: 11 % — ABNORMAL LOW (ref 12–46)
Lymphs Abs: 1.5 10*3/uL (ref 0.7–4.0)
MCH: 30.7 pg (ref 26.0–34.0)
MCHC: 35.2 g/dL (ref 30.0–36.0)
MCV: 87.4 fL (ref 78.0–100.0)
MONOS PCT: 7 % (ref 3–12)
Monocytes Absolute: 0.9 10*3/uL (ref 0.1–1.0)
NEUTROS ABS: 11.1 10*3/uL — AB (ref 1.7–7.7)
NEUTROS PCT: 81 % — AB (ref 43–77)
PLATELETS: 215 10*3/uL (ref 150–400)
RBC: 4.75 MIL/uL (ref 3.87–5.11)
RDW: 13.3 % (ref 11.5–15.5)
WBC: 13.8 10*3/uL — ABNORMAL HIGH (ref 4.0–10.5)

## 2013-04-17 LAB — COMPREHENSIVE METABOLIC PANEL
ALT: 16 U/L (ref 0–35)
AST: 13 U/L (ref 0–37)
Albumin: 4.1 g/dL (ref 3.5–5.2)
Alkaline Phosphatase: 89 U/L (ref 39–117)
BILIRUBIN TOTAL: 0.8 mg/dL (ref 0.3–1.2)
BUN: 14 mg/dL (ref 6–23)
CHLORIDE: 100 meq/L (ref 96–112)
CO2: 29 meq/L (ref 19–32)
CREATININE: 0.94 mg/dL (ref 0.50–1.10)
Calcium: 9.9 mg/dL (ref 8.4–10.5)
GFR, EST AFRICAN AMERICAN: 82 mL/min — AB (ref >90)
GFR, EST NON AFRICAN AMERICAN: 71 mL/min — AB (ref >90)
GLUCOSE: 109 mg/dL — AB (ref 70–99)
Potassium: 3.7 mEq/L (ref 3.7–5.3)
Sodium: 139 mEq/L (ref 137–147)
Total Protein: 8.3 g/dL (ref 6.0–8.3)

## 2013-04-17 LAB — LIPASE, BLOOD: Lipase: 29 U/L (ref 11–59)

## 2013-04-17 MED ORDER — ACETAMINOPHEN 325 MG PO TABS
650.0000 mg | ORAL_TABLET | Freq: Four times a day (QID) | ORAL | Status: DC | PRN
  Administered 2013-04-17 – 2013-04-18 (×2): 650 mg via ORAL
  Filled 2013-04-17 (×2): qty 2

## 2013-04-17 MED ORDER — ONDANSETRON HCL 4 MG PO TABS: 4.0000 mg | ORAL_TABLET | Freq: Four times a day (QID) | ORAL | Status: DC | PRN

## 2013-04-17 MED ORDER — ACETAMINOPHEN 650 MG RE SUPP: 650.0000 mg | Freq: Four times a day (QID) | RECTAL | Status: DC | PRN

## 2013-04-17 MED ORDER — POLYETHYLENE GLYCOL 3350 17 G PO PACK: 17.0000 g | PACK | Freq: Every day | ORAL | Status: DC | PRN

## 2013-04-17 MED ORDER — HYDROMORPHONE HCL PF 1 MG/ML IJ SOLN
1.0000 mg | Freq: Once | INTRAMUSCULAR | Status: AC
  Administered 2013-04-17: 1 mg via INTRAVENOUS
  Filled 2013-04-17: qty 1

## 2013-04-17 MED ORDER — ONDANSETRON HCL 4 MG/2ML IJ SOLN: 4.0000 mg | Freq: Four times a day (QID) | INTRAMUSCULAR | Status: DC | PRN

## 2013-04-17 MED ORDER — HYDROMORPHONE HCL PF 1 MG/ML IJ SOLN
1.0000 mg | Freq: Once | INTRAMUSCULAR | Status: AC
Start: 2013-04-17 — End: 2013-04-17
  Administered 2013-04-17: 1 mg via INTRAVENOUS
  Filled 2013-04-17: qty 1

## 2013-04-17 MED ORDER — MORPHINE SULFATE 2 MG/ML IJ SOLN
2.0000 mg | INTRAMUSCULAR | Status: DC | PRN
  Administered 2013-04-17 – 2013-04-18 (×5): 2 mg via INTRAVENOUS
  Filled 2013-04-17 (×5): qty 1

## 2013-04-17 MED ORDER — SODIUM CHLORIDE 0.9 % IV BOLUS (SEPSIS)
1000.0000 mL | Freq: Once | INTRAVENOUS | Status: AC
  Administered 2013-04-17: 1000 mL via INTRAVENOUS

## 2013-04-17 MED ORDER — IOHEXOL 300 MG/ML  SOLN
100.0000 mL | Freq: Once | INTRAMUSCULAR | Status: AC | PRN
  Administered 2013-04-17: 100 mL via INTRAVENOUS

## 2013-04-17 MED ORDER — ONDANSETRON HCL 4 MG/2ML IJ SOLN
4.0000 mg | Freq: Once | INTRAMUSCULAR | Status: AC
  Administered 2013-04-17: 4 mg via INTRAVENOUS
  Filled 2013-04-17: qty 2

## 2013-04-17 MED ORDER — SODIUM CHLORIDE 0.9 % IV SOLN
INTRAVENOUS | Status: DC
  Administered 2013-04-18 (×2): via INTRAVENOUS

## 2013-04-17 MED ORDER — CEPHALEXIN 500 MG PO CAPS
500.0000 mg | ORAL_CAPSULE | Freq: Two times a day (BID) | ORAL | Status: DC
  Administered 2013-04-17 – 2013-04-19 (×4): 500 mg via ORAL
  Filled 2013-04-17 (×4): qty 1

## 2013-04-17 MED ORDER — BISACODYL 10 MG RE SUPP: 10.0000 mg | Freq: Every day | RECTAL | Status: DC | PRN

## 2013-04-17 MED ORDER — IOHEXOL 300 MG/ML  SOLN
50.0000 mL | Freq: Once | INTRAMUSCULAR | Status: AC | PRN
  Administered 2013-04-17: 50 mL via ORAL

## 2013-04-17 NOTE — ED Notes (Signed)
Pt reports started having pain in lower back Tuesday after mopping on Monday.  Reports came here Friday for eval of back and upper abd pain and says was diagnosed with pancreatitis and UTI.  Pt taking antibiotics and pain medication but says feels worse.  Denies n/v/d.

## 2013-04-17 NOTE — ED Notes (Signed)
Pt c/o mid-to-lower back pain x5 days. Pain began after mopping floor. Pt also reports upper abdominal pain since Friday. Pt was seen on Friday and dx with UTI and pancreatitis. Pt states pain meds "aren't enough" and is here for re-evaluation.

## 2013-04-17 NOTE — Progress Notes (Signed)
Called to get report at 1452.  RN unable to give report at this time.

## 2013-04-17 NOTE — ED Provider Notes (Signed)
CSN: [id number]     Arrival date & time 04/17/13  1041 History  This chart was scribed for Nat Christen, MD by Marcha Dutton, ED Scribe. This patient was seen in room APA11/APA11 and the patient's care was started at 11:35 AM.     Chief Complaint  Patient presents with  . Abdominal Pain  . Back Pain      Patient is a 48 y.o. female presenting with back pain. The history is provided by the patient. No language interpreter was used.  Back Pain  HPI Comments: Kelli Thompson is a 48 y.o. female with a surgical h/o cholecystectomy, appendectomy, and abdominal hysterectomy who presents to the Emergency Department complaining of moderate to severe, gradually worsening abdominal pain in her epigastrium radiating into her back. She states that lying supine increases pain, while pulling her knees toward her chest decreases pain. Pt reports she was seen two days ago at [facility] and was diagnosed with pancreatitis. She reports she did not have a CT scan. He no alcohol. No trauma. Severity is moderate the  Past Medical History  Diagnosis Date  . Hypertension     Past Surgical History  Procedure Laterality Date  . Cesarean section    . Cholecystectomy    . Appendectomy    . Abdominal hysterectomy      Family History  Problem Relation Age of Onset  . Arthritis    . Diabetes      History  Substance Use Topics  . Smoking status: Former Research scientist (life sciences)  . Smokeless tobacco: Not on file  . Alcohol Use: No    OB History   Grav Para Term Preterm Abortions TAB SAB Ect Mult Living                  Review of Systems  Musculoskeletal: Positive for back pain.   A complete 10 system review of systems was obtained and all systems are negative except as noted in the HPI and PMH.    Allergies  Review of patient's allergies indicates no known allergies.  Home Medications   Current Outpatient Rx  Name  Route  Sig  Dispense  Refill  . cephALEXin (KEFLEX) 500 MG capsule   Oral  Take 1 capsule (500 mg total) by mouth 4 (four) times daily.   28 capsule   0   . lisinopril-hydrochlorothiazide (PRINZIDE,ZESTORETIC) 20-25 MG per tablet   Oral   Take 1 tablet by mouth daily.         Marland Kitchen oxyCODONE-acetaminophen (PERCOCET/ROXICET) 5-325 MG per tablet   Oral   Take 2 tablets by mouth every 4 (four) hours as needed for severe pain.   15 tablet   0   . amoxicillin (AMOXIL) 500 MG capsule   Oral   Take 1 capsule (500 mg total) by mouth 3 (three) times daily.   21 capsule   0    Triage Vitals: BP 131/80  Pulse 109  Temp(Src) 99 F (37.2 C) (Oral)  Resp 20  Ht 5\' 5"  (1.651 m)  Wt 210 lb (95.255 kg)  BMI 34.95 kg/m2  SpO2 97%  Physical Exam  Nursing note and vitals reviewed. Constitutional: She is oriented to person, place, and time. She appears well-developed and well-nourished.  HENT:  Head: Normocephalic.  Eyes: EOM are normal.  Abdominal: There is tenderness (Epigastrium radiating to back).  Neurological: She is alert and oriented to person, place, and time. She has normal reflexes.  Skin: Skin is warm and  dry.  Psychiatric: She has a normal mood and affect. Her behavior is normal. Thought content normal.    ED Course  Procedures (including critical care time)  DIAGNOSTIC STUDIES: Oxygen Saturation is 97% on RA, adequate by my interpretation.    COORDINATION OF CARE: 11:39 AM- Will order pain medication and CT of abdomen. Pt advised of plan for treatment and pt agrees.    Labs Review Labs Reviewed  COMPREHENSIVE METABOLIC PANEL - Abnormal; Notable for the following:    Glucose, Bld 109 (*)    GFR calc non Af Amer 71 (*)    GFR calc Af Amer 82 (*)    All other components within normal limits  CBC WITH DIFFERENTIAL - Abnormal; Notable for the following:    WBC 13.8 (*)    Neutrophils Relative % 81 (*)    Neutro Abs 11.1 (*)    Lymphocytes Relative 11 (*)    All other components within normal limits  URINALYSIS, ROUTINE W REFLEX  MICROSCOPIC - Abnormal; Notable for the following:    APPearance CLOUDY (*)    Bilirubin Urine SMALL (*)    All other components within normal limits  LIPASE, BLOOD  URINE MICROSCOPIC-ADD ON   Imaging Review Ct Abdomen Pelvis W Contrast  04/17/2013   CLINICAL DATA:  Abdominal pain  EXAM: CT ABDOMEN AND PELVIS WITH CONTRAST  TECHNIQUE: Multidetector CT imaging of the abdomen and pelvis was performed using the standard protocol following bolus administration of intravenous contrast.  CONTRAST:  64mL OMNIPAQUE IOHEXOL 300 MG/ML SOLN, 152mL OMNIPAQUE IOHEXOL 300 MG/ML SOLN  COMPARISON:  06/23/2009  FINDINGS: There is stranding in an about the pancreatic head and uncinate process. There is also expansion of the pancreatic head. There is no evidence of pancreatic hemorrhage. There is no focal fluid collection. Stranding extends into the retroperitoneum.  Postcholecystectomy.  The liver, spleen, adrenal glands, kidneys are unremarkable.  Bladder is within normal limits.  Post hysterectomy.  No free-fluid.  No abnormal retroperitoneal adenopathy.  Left iliac sclerotic lesion is bigger. Metastatic disease is not excluded.  IMPRESSION: Findings most consistent with acute pancreatitis.  Left iliac sclerotic bone lesion is larger. Differential diagnosis includes metastatic disease or bone infarct.   Electronically Signed   By: Maryclare Bean M.D.   On: 04/17/2013 13:21     EKG Interpretation None      MDM   Final diagnoses:  Pancreatitis    CT scan shows acute pancreatitis. Lipase normal. IV fluids, pain management. Admit to general medicine.  I personally performed the services described in this documentation, which was scribed in my presence. The recorded information has been reviewed and is accurate.   Nat Christen, MD 04/17/13 1421

## 2013-04-17 NOTE — H&P (Signed)
History and Physical  Kelli Thompson [id number]:[id number] DOB: 1965/12/20 DOA: 04/17/2013  Referring physician: Dr. Lindajo Royal in ED PCP: Purvis Kilts, MD   Chief Complaint: Abdominal pain and back.  HPI:  48 year old woman presented to the emergency department with worsening back pain since 3/18. Laboratory studies were unrevealing but CT suggested acute pancreatitis.  Patient with uncomplicated past medical history on lisinopril/hydrochlorothiazide for hypertension. No history of pancreatitis. 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 3/18 she developed some mid back pain with radiation to her abdomen and epigastrium which she initially associated with a muscular strain from vomiting. She was seen in the emergency Department 3/21 and diagnosed with mild acute pancreatitis, UTI and discharged. Since that time her symptoms have worsened with worsening epigastric pain with radiation to her back. No nausea or vomiting but very poor appetite with limited oral intake. Has not had a bowel movement for several days. No specific aggravating or alleviating factors. Denies history of alcohol use.  In the emergency department now to be afebrile stable vital signs. No hypoxia. Complete metabolic panel unremarkable. Lipase is normal. Calcium normal. WBC 13.8. Remainder CBC unremarkable. Repeat urinalysis negative on antibiotics. CT of the abdomen and pelvis demonstrated extensive pancreatic head as well as stranding suggestive of acute pancreatitis. She was treated with Dilaudid and antiemetics.  Review of Systems:  Negative for fever, visual changes, sore throat, rash, new muscle aches, chest pain, SOB, dysuria, bleeding.  Past Medical History  Diagnosis Date  . Hypertension     Past Surgical History  Procedure Laterality Date  . Cesarean section      x2  . Cholecystectomy    . Appendectomy    . Abdominal hysterectomy      Social History:  reports that she has quit smoking. She  does not have any smokeless tobacco history on file. She reports that she does not drink alcohol or use illicit drugs.  No Known Allergies  Family History  Problem Relation Age of Onset  . Arthritis    . Diabetes    . Hypertension Mother   . Hypertension Father      Prior to Admission medications   Medication Sig Start Date End Date Taking? Authorizing Provider  cephALEXin (KEFLEX) 500 MG capsule Take 1 capsule (500 mg total) by mouth 4 (four) times daily. 04/16/13  Yes Sharyon Cable, MD  lisinopril-hydrochlorothiazide (PRINZIDE,ZESTORETIC) 20-25 MG per tablet Take 1 tablet by mouth daily.   Yes Historical Provider, MD  oxyCODONE-acetaminophen (PERCOCET/ROXICET) 5-325 MG per tablet Take 2 tablets by mouth every 4 (four) hours as needed for severe pain. 04/16/13  Yes Sharyon Cable, MD  amoxicillin (AMOXIL) 500 MG capsule Take 1 capsule (500 mg total) by mouth 3 (three) times daily. 03/25/13   Abigail Butts, PA-C   Physical Exam: Filed Vitals:   04/17/13 1052 04/17/13 1444  BP: 131/80 135/75  Pulse: 109 94  Temp: 99 F (37.2 C)   TempSrc: Oral   Resp: 20 20  Height: 5\' 5"  (1.651 m)   Weight: 95.255 kg (210 lb)   SpO2: 97% 96%   General: Examined in the emergency department. Appears calm, moderately uncomfortable, somewhat restless, changing positions. Eyes: PERRL, normal lids, irises ENT: grossly normal hearing, lips & tongue Neck: no LAD, masses or thyromegaly Cardiovascular: RRR, no m/r/g. No LE edema. Respiratory: CTA bilaterally, no w/r/r. Normal respiratory effort. Abdomen: Obese. Soft, nondistended. No gross abnormalities. No evidence of hernia. No right upper quadrant pain.  No suprapubic pain. Localizes pain to the epigastrium. No rebound or guarding. Benign examination. Skin: no rash or induration seen Musculoskeletal: grossly normal tone BUE/BLE moves without difficulty. There is no pain with palpation of the thoracic or lumbar spine and no gross deformity  seen. Psychiatric: grossly normal mood and affect, speech fluent and appropriate Neurologic: grossly non-focal.  Wt Readings from Last 3 Encounters:  04/17/13 95.255 kg (210 lb)  04/15/13 95.255 kg (210 lb)  03/25/13 95.255 kg (210 lb)    Labs on Admission:  Basic Metabolic Panel:  Recent Labs Lab 04/16/13 0101 04/17/13 1155  NA 140 139  K 4.3 3.7  CL 100 100  CO2 30 29  GLUCOSE 129* 109*  BUN 13 14  CREATININE 1.04 0.94  CALCIUM 9.5 9.9    Liver Function Tests:  Recent Labs Lab 04/16/13 0101 04/17/13 1155  AST 18 13  ALT 17 16  ALKPHOS 85 89  BILITOT 0.3 0.8  PROT 7.2 8.3  ALBUMIN 3.9 4.1    Recent Labs Lab 04/16/13 0101 04/17/13 1155  LIPASE 82* 29    CBC:  Recent Labs Lab 04/16/13 0101 04/17/13 1155  WBC 10.2 13.8*  NEUTROABS 7.3 11.1*  HGB 13.3 14.6  HCT 38.4 41.5  MCV 87.5 87.4  PLT 208 215    Radiological Exams on Admission: Ct Abdomen Pelvis W Contrast  04/17/2013   CLINICAL DATA:  Abdominal pain  EXAM: CT ABDOMEN AND PELVIS WITH CONTRAST  TECHNIQUE: Multidetector CT imaging of the abdomen and pelvis was performed using the standard protocol following bolus administration of intravenous contrast.  CONTRAST:  69mL OMNIPAQUE IOHEXOL 300 MG/ML SOLN, 126mL OMNIPAQUE IOHEXOL 300 MG/ML SOLN  COMPARISON:  06/23/2009  FINDINGS: There is stranding in an about the pancreatic head and uncinate process. There is also expansion of the pancreatic head. There is no evidence of pancreatic hemorrhage. There is no focal fluid collection. Stranding extends into the retroperitoneum.  Postcholecystectomy.  The liver, spleen, adrenal glands, kidneys are unremarkable.  Bladder is within normal limits.  Post hysterectomy.  No free-fluid.  No abnormal retroperitoneal adenopathy.  Left iliac sclerotic lesion is bigger. Metastatic disease is not excluded.  IMPRESSION: Findings most consistent with acute pancreatitis.  Left iliac sclerotic bone lesion is larger.  Differential diagnosis includes metastatic disease or bone infarct.   Electronically Signed   By: Maryclare Bean M.D.   On: 04/17/2013 13:21    Active Problems:   Pancreatitis   Assessment/Plan 1. Epigastric abdominal pain with radiation to the back, suspected pancreatitis. 2. Suspected pancreatitis. Although lipase is now normal compared to yesterday, CT suggests acute pancreatitis and this fits with her clinical symptomology. Etiology unclear. No alcohol. Only outpatient medications are lisinopril and hydrochlorothiazide which can cause pancreatitis but are not any medications. CT without other evidence of intra-abdominal pathology. 3. Hypertension. Stable. 4. Possible UTI. Complete Keflex. No culture sent. 5. 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 Differential diagnosis includes metastatic disease or bone infarct. Recommended outpatient followup, bone scan could be considered.   Admit to medical floor. Supportive care, IV fluids, analgesics, antiemetics. Repeat CMP, lipase in the morning. N.p.o., IV fluids. Hold hydrochlorothiazide and lisinopril.  Check lipid panel.  Code Status: full code  DVT prophylaxis: SCDs Family Communication: discussed with husband and children at bedside Disposition Plan/Anticipated LOS: admit, 2-4 days  Time spent: 60 minutes  Murray Hodgkins, MD  [facility] Pager [phone] 04/17/2013, 3:20 PM

## 2013-04-18 DIAGNOSIS — E876 Hypokalemia: Secondary | ICD-10-CM | POA: Diagnosis present

## 2013-04-18 LAB — COMPREHENSIVE METABOLIC PANEL
ALBUMIN: 3.2 g/dL — AB (ref 3.5–5.2)
ALK PHOS: 89 U/L (ref 39–117)
ALT: 14 U/L (ref 0–35)
AST: 13 U/L (ref 0–37)
BILIRUBIN TOTAL: 0.9 mg/dL (ref 0.3–1.2)
BUN: 12 mg/dL (ref 6–23)
CHLORIDE: 104 meq/L (ref 96–112)
CO2: 28 mEq/L (ref 19–32)
Calcium: 9.2 mg/dL (ref 8.4–10.5)
Creatinine, Ser: 0.84 mg/dL (ref 0.50–1.10)
GFR calc Af Amer: >90 mL/min (ref >90)
GFR calc non Af Amer: 81 mL/min — ABNORMAL LOW (ref >90)
Glucose, Bld: 85 mg/dL (ref 70–99)
Potassium: 3.4 mEq/L — ABNORMAL LOW (ref 3.7–5.3)
Sodium: 142 mEq/L (ref 137–147)
Total Protein: 6.9 g/dL (ref 6.0–8.3)

## 2013-04-18 LAB — CBC
HCT: 37.1 % (ref 36.0–46.0)
Hemoglobin: 12.8 g/dL (ref 12.0–15.0)
MCH: 30.3 pg (ref 26.0–34.0)
MCHC: 34.5 g/dL (ref 30.0–36.0)
MCV: 87.7 fL (ref 78.0–100.0)
Platelets: 204 10*3/uL (ref 150–400)
RBC: 4.23 MIL/uL (ref 3.87–5.11)
RDW: 13.1 % (ref 11.5–15.5)
WBC: 8.8 10*3/uL (ref 4.0–10.5)

## 2013-04-18 LAB — LIPID PANEL
Cholesterol: 138 mg/dL (ref 0–200)
HDL: 37 mg/dL — ABNORMAL LOW (ref >39)
LDL CALC: 88 mg/dL (ref 0–99)
Total CHOL/HDL Ratio: 3.7 RATIO
Triglycerides: 64 mg/dL (ref <150)
VLDL: 13 mg/dL (ref 0–40)

## 2013-04-18 LAB — LIPASE, BLOOD: LIPASE: 20 U/L (ref 11–59)

## 2013-04-18 MED ORDER — POTASSIUM CHLORIDE CRYS ER 20 MEQ PO TBCR
40.0000 meq | EXTENDED_RELEASE_TABLET | Freq: Once | ORAL | Status: AC
  Administered 2013-04-18: 40 meq via ORAL
  Filled 2013-04-18: qty 2

## 2013-04-18 MED ORDER — HYDROCODONE-ACETAMINOPHEN 5-325 MG PO TABS
1.0000 | ORAL_TABLET | ORAL | Status: DC | PRN
  Administered 2013-04-18 – 2013-04-19 (×2): 1 via ORAL
  Filled 2013-04-18 (×3): qty 1

## 2013-04-18 NOTE — Progress Notes (Signed)
Patient seen, independently examined and chart reviewed. I agree with exam, assessment and plan discussed with Dyanne Carrel, NP.  Subjective: She feels somewhat better today but still has significant upper-outer abdominal pain. No vomiting. She does report being hungry.  Objective: Afebrile, vital signs stable. No hypoxia. Today she appears calm and comfortable, no longer restless. Cardiovascular regular rate and rhythm. No murmur, rub or gallop. Respiratory clear to auscultation bilaterally. No wheezes, rales or rhonchi. Normal respiratory effort. Abdomen soft, nondistended, no significant epigastric pain. Psychiatric grossly normal mood and affect. Speech fluent and appropriate.  Potassium 3.4, complete metabolic panel otherwise unremarkable, lipase normal. Triglycerides normal. CBC normal with normal WBC.  Clinically she appears much today, treating for presumed pancreatitis based on imaging and clinical symptomology although her lipase has returned to normal. Afebrile, stable hemodynamics, no leukocytosis, no signs or symptoms of acute intra-abdominal pathology or infection. Plan to advance diet once no longer requiring IV pain medication.  Etiology of pancreatitis unclear, triglycerides normal. No history of pancreatitis or alcohol use. Could be secondary to lisinopril or hydrochlorothiazide.  Murray Hodgkins, MD [facility] [phone]

## 2013-04-18 NOTE — Progress Notes (Signed)
[facility] PROGRESS NOTE  Kelli Thompson KTG:[id number] DOB: [birth year] DOA: 04/17/2013 PCP: Purvis Kilts, MD    Summary: 48 year old admitted with acute pancreatitis per CT. No hx of same. No hx ETOH.   Assessment/Plan: Epigastric abdominal pain with radiation to the back, suspected pancreatitis. Very little improvement this am. Will continue supportive care, IV fluids and analgesics.   Suspected pancreatitis. Lipase remains normal. CT suggests acute pancreatitis and this fits with her clinical symptomology. Etiology unclear. No alcohol. Continue to hold lisinopril and hydrochlorothiazide. CT without other evidence of intra-abdominal pathology. Lipid panel with HDL 37 otherwise within the limits of normal. Continue with IV fluids, NPO and analgesics. Monitor closely  Hypertension. Stable.   Possible UTI. Continue  Keflex day #2. She is afebrile and non-toxic. No culture sent.   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 Differential diagnosis includes metastatic disease or bone infarct. Recommended outpatient followup, bone scan could be considered  Hypokalemia: mild. Likely related to NPO status. Will monitor and replete if further downward trend.   Code Status: full Family Communication: husband at bedside Disposition Plan: home when ready   Consultants:  none  Procedures:  none  Antibiotics:  Keflex 04/17/13>>  HPI/Subjective: In bed awake and alert. Reports minimal improvement in abdominal/back pain.   Objective: Filed Vitals:   04/18/13 0534  BP: 131/77  Pulse: 80  Temp: 98.4 F (36.9 C)  Resp: 20    Intake/Output Summary (Last 24 hours) at 04/18/13 0916 Last data filed at 04/18/13 4287  Gross per 24 hour  Intake      0 ml  Output    800 ml  Net   -800 ml   Filed Weights   04/17/13 1052 04/17/13 1500  Weight: 95.255 kg (210 lb) 94.4 kg (208 lb 1.8 oz)    Exam:   General:  Well nourished,  NAD  Cardiovascular: RRR No MGR No LE edema  Respiratory: normal effort BS clear bilaterally no wheeze  Abdomen: obese soft round but not distended. +BS mild tenderness epigastric area  Musculoskeletal: joints without swelling/erythema. Thoracis spine non-tender to palpation.   Data Reviewed: Basic Metabolic Panel:  Recent Labs Lab 04/16/13 0101 04/17/13 1155 04/18/13 0551  NA 140 139 142  K 4.3 3.7 3.4*  CL 100 100 104  CO2 30 29 28   GLUCOSE 129* 109* 85  BUN 13 14 12   CREATININE 1.04 0.94 0.84  CALCIUM 9.5 9.9 9.2   Liver Function Tests:  Recent Labs Lab 04/16/13 0101 04/17/13 1155 04/18/13 0551  AST 18 13 13   ALT 17 16 14   ALKPHOS 85 89 89  BILITOT 0.3 0.8 0.9  PROT 7.2 8.3 6.9  ALBUMIN 3.9 4.1 3.2*    Recent Labs Lab 04/16/13 0101 04/17/13 1155 04/18/13 0551  LIPASE 82* 29 20   No results found for this basename: AMMONIA,  in the last 168 hours CBC:  Recent Labs Lab 04/16/13 0101 04/17/13 1155 04/18/13 0551  WBC 10.2 13.8* 8.8  NEUTROABS 7.3 11.1*  --   HGB 13.3 14.6 12.8  HCT 38.4 41.5 37.1  MCV 87.5 87.4 87.7  PLT 208 215 204   Cardiac Enzymes: No results found for this basename: CKTOTAL, CKMB, CKMBINDEX, TROPONINI,  in the last 168 hours BNP (last 3 results) No results found for this basename: PROBNP,  in the last 8760 hours CBG: No results found for this basename: GLUCAP,  in the last 168 hours  No results found for this or any previous visit (from the past 240 hour(s)).   Studies: Ct Abdomen Pelvis W Contrast  04/17/2013   CLINICAL DATA:  Abdominal pain  EXAM: CT ABDOMEN AND PELVIS WITH CONTRAST  TECHNIQUE: Multidetector CT imaging of the abdomen and pelvis was performed using the standard protocol following bolus administration of intravenous contrast.  CONTRAST:  39mL OMNIPAQUE IOHEXOL 300 MG/ML SOLN, 111mL OMNIPAQUE IOHEXOL 300 MG/ML SOLN  COMPARISON:  06/23/2009  FINDINGS: There is stranding in an about the pancreatic head and  uncinate process. There is also expansion of the pancreatic head. There is no evidence of pancreatic hemorrhage. There is no focal fluid collection. Stranding extends into the retroperitoneum.  Postcholecystectomy.  The liver, spleen, adrenal glands, kidneys are unremarkable.  Bladder is within normal limits.  Post hysterectomy.  No free-fluid.  No abnormal retroperitoneal adenopathy.  Left iliac sclerotic lesion is bigger. Metastatic disease is not excluded.  IMPRESSION: Findings most consistent with acute pancreatitis.  Left iliac sclerotic bone lesion is larger. Differential diagnosis includes metastatic disease or bone infarct.   Electronically Signed   By: Maryclare Bean M.D.   On: 04/17/2013 13:21    Scheduled Meds: . cephALEXin  500 mg Oral Q12H   Continuous Infusions: . sodium chloride 75 mL/hr at 04/18/13 0400    Principal Problem:   Acute pancreatitis Active Problems:   Pancreatitis   HTN (hypertension)   Hypokalemia    Time spent: 35 minutes    [facility] Pager [phone]. If 7PM-7AM, please contact night-coverage at [email or web address], password [facility] 04/18/2013, 9:16 AM  LOS: 1 day

## 2013-04-18 NOTE — Care Management Note (Signed)
UR completed 

## 2013-04-19 DIAGNOSIS — E876 Hypokalemia: Secondary | ICD-10-CM

## 2013-04-19 DIAGNOSIS — I10 Essential (primary) hypertension: Secondary | ICD-10-CM

## 2013-04-19 DIAGNOSIS — K859 Acute pancreatitis without necrosis or infection, unspecified: Principal | ICD-10-CM

## 2013-04-19 LAB — BASIC METABOLIC PANEL
BUN: 17 mg/dL (ref 6–23)
CO2: 27 meq/L (ref 19–32)
Calcium: 9.1 mg/dL (ref 8.4–10.5)
Chloride: 104 mEq/L (ref 96–112)
Creatinine, Ser: 0.81 mg/dL (ref 0.50–1.10)
GFR calc Af Amer: >90 mL/min (ref >90)
GFR calc non Af Amer: 85 mL/min — ABNORMAL LOW (ref >90)
GLUCOSE: 82 mg/dL (ref 70–99)
POTASSIUM: 4.1 meq/L (ref 3.7–5.3)
Sodium: 142 mEq/L (ref 137–147)

## 2013-04-19 LAB — CBC
HCT: 35.2 % — ABNORMAL LOW (ref 36.0–46.0)
HEMOGLOBIN: 12.3 g/dL (ref 12.0–15.0)
MCH: 30.3 pg (ref 26.0–34.0)
MCHC: 34.9 g/dL (ref 30.0–36.0)
MCV: 86.7 fL (ref 78.0–100.0)
Platelets: 190 10*3/uL (ref 150–400)
RBC: 4.06 MIL/uL (ref 3.87–5.11)
RDW: 12.7 % (ref 11.5–15.5)
WBC: 6 10*3/uL (ref 4.0–10.5)

## 2013-04-19 MED ORDER — OXYCODONE-ACETAMINOPHEN 5-325 MG PO TABS: 2.0000 | ORAL_TABLET | ORAL | Status: DC | PRN

## 2013-04-19 MED ORDER — CEPHALEXIN 500 MG PO CAPS: 500.0000 mg | ORAL_CAPSULE | Freq: Two times a day (BID) | ORAL | Status: DC

## 2013-04-19 NOTE — Plan of Care (Signed)
Problem: Food- and Nutrition-Related Knowledge Deficit (NB-1.1) Goal: Nutrition education Formal process to instruct or train a patient/client in a skill or to impart knowledge to help patients/clients voluntarily manage or modify food choices and eating behavior to maintain or improve health.  RD consulted for nutrition education regarding weight loss.  Pt reports good appetite today. She finished 100% of her full liquid breakfast. She reports poor appetite prior to today through last Saturday due to abdominal pain, however, appetite is generally good. She reports she is feeling much better since admission.  When questioned about her weight hx, she reports that "Nothing that I do works". Most recently, she reports that she lost 30 pounds in October 2014, but she has gained about 10 pounds back since then. She reports that this most recent intervention was "Tje Whole 30" diet, which was recommended to her by her PCP at [facility] [name]). She describes this diet as a paleo-style diet and reports that it was too restrictive for her. She reports she would follow this diet again if it helped her lose weight, but cannot follow it for longer than 30 days. Had a long conversation with pt regarding lifelong lifestyle modifications to achieve slow, moderate weight loss and maintenance. Encouraged her to continue to work toward a healthy lifestyle to promote small, moderate weight loss by making small changes to her overall diet and physical activity.   Body mass index is 35.69 kg/(m^2). Pt meets criteria for obesity, class II based on current BMI.  RD provided "Weight Loss Tips" and "Pancreatitis Nutrition Therapy" handouts from the Academy [employer]. Emphasized the importance of serving sizes and provided examples of correct portions of common foods. Discussed importance of controlled and consistent intake throughout the day. Provided examples of ways to balance meals/snacks and  encouraged intake of high-fiber, whole grain complex carbohydrates. Emphasized the importance of hydration with calorie-free beverages and limiting sugar-sweetened beverages. Encouraged pt to discuss physical activity options with physician. Also discussed importance of compliance of low fat diet for pancreatitis and educated pt on specific ways to decrease fat content in her diet by choosing lean meats, low fat dairy, and low fat food preparation methods.Teach back method used.  Pt also requested food diary sheets so that she can being a food diary at home. She reports that she has used apps on her smartphone in the past, but feels she would do better with a hard copy. Sample food diary sheets provided.   Expect fair to good compliance.  Current diet order is Full liquid, patient is consuming approximately 100% of meals at this time. Labs and medications reviewed. No further nutrition interventions warranted at this time. RD contact information provided. If additional nutrition issues arise, please re-consult RD.  Kelli Thompson A. Kelli Thompson, RD, LDN Pager: [phone]

## 2013-04-19 NOTE — Progress Notes (Signed)
Iv removed. Discharge instructions reviewed with patient. Understanding verblized. Ready for discharge home.

## 2013-04-19 NOTE — Discharge Summary (Signed)
Physician Discharge Summary  Kelli Thompson:[id number] DOB: 12-13-1965 DOA: 04/17/2013  PCP: Purvis Kilts, MD  Admit date: 04/17/2013 Discharge date: 04/19/2013  Time spent: 40 minutes  Recommendations for Outpatient Follow-up:  1. Follow up in 7-10 days for evaluation of resolution of presumed pancreatitis. Patient has been instructed to advance diet slowly as tolerated.  Dr. Delanna Ahmadi office will call patient with appointment.  Recommend BP evaluation as lisinopril/HCTZ discontinued due to pancreatitis. BP with fair control at discharge without medication. Recommend urinalysis to ensure resolution of UTI 2. Recommend considering bone scan or PET-CT to follow up ileac bone lesion on CT abdomen/pelvis.  3. Consider outpatient GI evaluation for pancreatitis.  Discharge Diagnoses:  Principal Problem:   Acute pancreatitis Active Problems:   Pancreatitis   HTN (hypertension)   Hypokalemia   Discharge Condition: stable  Diet recommendation: full liquid to be advanced as tolerated  Filed Weights   04/17/13 1052 04/17/13 1500 04/19/13 0542  Weight: 95.255 kg (210 lb) 94.4 kg (208 lb 1.8 oz) 97.297 kg (214 lb 8 oz)    History of present illness:  48 year old woman presented to the emergency department on 04/17/13 with worsening back pain since 3/18. Laboratory studies were unrevealing but CT suggested acute pancreatitis.  Patient with uncomplicated past medical history on lisinopril/hydrochlorothiazide for hypertension. No history of pancreatitis. 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 3/18 she developed some mid back pain with radiation to her abdomen and epigastrium which she initially associated with a muscular strain from vomiting. She was seen in the emergency Department 3/21 and diagnosed with mild acute pancreatitis, UTI and discharged. Her symptoms worsened with worsening epigastric pain with radiation to her back. No nausea or vomiting but very poor  appetite with limited oral intake. Had not had a bowel movement for several days. No specific aggravating or alleviating factors. Denied history of alcohol use.  In the emergency department found to be afebrile with stable vital signs. No hypoxia. Complete metabolic panel unremarkable. Lipase was normal. Calcium normal. WBC 13.8. Remainder CBC unremarkable. Repeat urinalysis negative on antibiotics. CT of the abdomen and pelvis demonstrated expansion of  pancreatic head as well as stranding suggestive of acute pancreatitis.   Hospital Course:  Epigastric abdominal pain with radiation to the back likely related to pancreatitis. Provided with bowel rest and supportive care, IV fluids and analgesics. At discharge much improved. Tolerating full liquid diet. Recommend follow up with PCP 7-10 days for evaluation. Has been instructed to advance diet slowly as tolerated.  Suspected pancreatitis. Lipase remained normal. CT suggested acute pancreatitis and this fits with her clinical symptomology. Etiology unclear. No alcohol. May be related to  lisinopril and hydrochlorothiazide. These medications held during this hospitalization. CT without other evidence of intra-abdominal pathology. Lipid panel with HDL 37 otherwise within the limits of normal.    Hypertension. Stable during this hospitalization. Lisinopril and HCTZ discontinued at discharge due to #2. Recommend close OP follow up of BP for optimal control. Patient educated to lifestyle modifications.   Possible UTI. Provided with Keflex to complete 7 day course. She is afebrile and non-toxic. No culture sent.   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 Differential diagnosis includes metastatic disease or bone infarct. Recommended outpatient followup, bone scan could be considered   Hypokalemia: mild. Resolved at discharge  Procedures:  none  Consultations:  none  Discharge Exam: Filed Vitals:    04/19/13 0542  BP: 141/70  Pulse: 64  Temp: 97.5 F (36.4 C)  Resp: 20    General: calm appears comfortable Cardiovascular: RRR No MGR No LE edema Respiratory: normal effort BS clear bilaterally no wheeze Abdomen: soft +BS throughout. Mild tenderness epigastric area no guarding   Discharge Instructions     Medication List    STOP taking these medications       amoxicillin 500 MG capsule  Commonly known as:  AMOXIL     lisinopril-hydrochlorothiazide 20-25 MG per tablet  Commonly known as:  PRINZIDE,ZESTORETIC      TAKE these medications       cephALEXin 500 MG capsule  Commonly known as:  KEFLEX  Take 1 capsule (500 mg total) by mouth every 12 (twelve) hours.     oxyCODONE-acetaminophen 5-325 MG per tablet  Commonly known as:  PERCOCET/ROXICET  Take 2 tablets by mouth every 4 (four) hours as needed for severe pain.       No Known Allergies     Follow-up Information   Follow up with Purvis Kilts, MD. (office will call for appointment)    Specialty:  Family Medicine   Contact information:   [address] [place] [address] [phone]        The results of significant diagnostics from this hospitalization (including imaging, microbiology, ancillary and laboratory) are listed below for reference.    Significant Diagnostic Studies: Ct Abdomen Pelvis W Contrast  04/17/2013   CLINICAL DATA:  Abdominal pain  EXAM: CT ABDOMEN AND PELVIS WITH CONTRAST  TECHNIQUE: Multidetector CT imaging of the abdomen and pelvis was performed using the standard protocol following bolus administration of intravenous contrast.  CONTRAST:  12mL OMNIPAQUE IOHEXOL 300 MG/ML SOLN, 179mL OMNIPAQUE IOHEXOL 300 MG/ML SOLN  COMPARISON:  06/23/2009  FINDINGS: There is stranding in an about the pancreatic head and uncinate process. There is also expansion of the pancreatic head. There is no evidence of pancreatic hemorrhage. There is no focal fluid  collection. Stranding extends into the retroperitoneum.  Postcholecystectomy.  The liver, spleen, adrenal glands, kidneys are unremarkable.  Bladder is within normal limits.  Post hysterectomy.  No free-fluid.  No abnormal retroperitoneal adenopathy.  Left iliac sclerotic lesion is bigger. Metastatic disease is not excluded.  IMPRESSION: Findings most consistent with acute pancreatitis.  Left iliac sclerotic bone lesion is larger. Differential diagnosis includes metastatic disease or bone infarct.   Electronically Signed   By: Maryclare Bean M.D.   On: 04/17/2013 13:21    Microbiology: No results found for this or any previous visit (from the past 240 hour(s)).   Labs: Basic Metabolic Panel:  Recent Labs Lab 04/16/13 0101 04/17/13 1155 04/18/13 0551 04/19/13 0608  NA 140 139 142 142  K 4.3 3.7 3.4* 4.1  CL 100 100 104 104  CO2 30 29 28 27   GLUCOSE 129* 109* 85 82  BUN 13 14 12 17   CREATININE 1.04 0.94 0.84 0.81  CALCIUM 9.5 9.9 9.2 9.1   Liver Function Tests:  Recent Labs Lab 04/16/13 0101 04/17/13 1155 04/18/13 0551  AST 18 13 13   ALT 17 16 14   ALKPHOS 85 89 89  BILITOT 0.3 0.8 0.9  PROT 7.2 8.3 6.9  ALBUMIN 3.9 4.1 3.2*    Recent Labs Lab 04/16/13 0101 04/17/13 1155 04/18/13 0551  LIPASE 82* 29 20   No results found for this basename: AMMONIA,  in the last 168 hours CBC:  Recent Labs Lab 04/16/13 0101 04/17/13 1155 04/18/13 0551  04/19/13 0608  WBC 10.2 13.8* 8.8 6.0  NEUTROABS 7.3 11.1*  --   --   HGB 13.3 14.6 12.8 12.3  HCT 38.4 41.5 37.1 35.2*  MCV 87.5 87.4 87.7 86.7  PLT 208 215 204 190   Cardiac Enzymes: No results found for this basename: CKTOTAL, CKMB, CKMBINDEX, TROPONINI,  in the last 168 hours BNP: BNP (last 3 results) No results found for this basename: PROBNP,  in the last 8760 hours CBG: No results found for this basename: GLUCAP,  in the last 168 hours     Signed:  Radene Gunning  [facility] 04/19/2013, 12:44  PM  Attending note:  Patient seen and examined. Above note reviewed.  Regarding her pancreatitis, the patient has improved. She is tolerating a solid diet without any significant discomfort. She has not had any vomiting. Volume status appears to be adequate. The patient is ready for discharge home. No clear cause of her pancreatitis was found, since the patient is already status post cholecystectomy and she does not drink alcohol. This could possibly be related to medications, although she may benefit from an outpatient gastroenterology evaluation. She did have concerning findings of a lesion in her left iliac bone. Differential diagnoses included metastatic disease versus bone infarct. This could be further evaluated in the outpatient setting with PET-CT versus bone scan.  Taisha Pennebaker

## 2013-04-28 ENCOUNTER — Other Ambulatory Visit (HOSPITAL_COMMUNITY): Payer: Self-pay | Admitting: Family Medicine

## 2013-04-28 DIAGNOSIS — M999 Biomechanical lesion, unspecified: Secondary | ICD-10-CM

## 2013-05-03 ENCOUNTER — Encounter (HOSPITAL_COMMUNITY): Payer: Self-pay

## 2013-05-03 ENCOUNTER — Encounter (HOSPITAL_COMMUNITY)
Admission: RE | Admit: 2013-05-03 | Discharge: 2013-05-03 | Disposition: A | Discharge status: Home / Self Care | Payer: Managed Care, Other (non HMO) | Source: Ambulatory Visit | Attending: Family Medicine | Admitting: Family Medicine

## 2013-05-03 DIAGNOSIS — M999 Biomechanical lesion, unspecified: Secondary | ICD-10-CM | POA: Insufficient documentation

## 2013-05-03 MED ORDER — TECHNETIUM TC 99M MEDRONATE IV KIT
25.0000 | PACK | Freq: Once | INTRAVENOUS | Status: AC | PRN
  Administered 2013-05-03: 25 via INTRAVENOUS

## 2013-05-12 ENCOUNTER — Encounter: Payer: Self-pay | Admitting: *Deleted

## 2013-06-13 ENCOUNTER — Ambulatory Visit: Payer: Self-pay | Admitting: Gastroenterology

## 2013-10-03 ENCOUNTER — Encounter (HOSPITAL_COMMUNITY): Payer: Self-pay | Admitting: Emergency Medicine

## 2013-10-03 ENCOUNTER — Emergency Department (HOSPITAL_COMMUNITY)
Admission: EM | Admit: 2013-10-03 | Discharge: 2013-10-03 | Disposition: A | Discharge status: Home / Self Care | Payer: Managed Care, Other (non HMO) | Attending: Emergency Medicine | Admitting: Emergency Medicine

## 2013-10-03 DIAGNOSIS — J029 Acute pharyngitis, unspecified: Secondary | ICD-10-CM | POA: Insufficient documentation

## 2013-10-03 DIAGNOSIS — J020 Streptococcal pharyngitis: Secondary | ICD-10-CM

## 2013-10-03 DIAGNOSIS — I10 Essential (primary) hypertension: Secondary | ICD-10-CM | POA: Insufficient documentation

## 2013-10-03 DIAGNOSIS — R000 Tachycardia, unspecified: Secondary | ICD-10-CM | POA: Insufficient documentation

## 2013-10-03 DIAGNOSIS — H6120 Impacted cerumen, unspecified ear: Secondary | ICD-10-CM | POA: Insufficient documentation

## 2013-10-03 DIAGNOSIS — H6123 Impacted cerumen, bilateral: Secondary | ICD-10-CM

## 2013-10-03 DIAGNOSIS — Z87891 Personal history of nicotine dependence: Secondary | ICD-10-CM | POA: Insufficient documentation

## 2013-10-03 DIAGNOSIS — Z8719 Personal history of other diseases of the digestive system: Secondary | ICD-10-CM | POA: Insufficient documentation

## 2013-10-03 HISTORY — DX: Acute pancreatitis without necrosis or infection, unspecified: K85.90

## 2013-10-03 LAB — RAPID STREP SCREEN (MED CTR MEBANE ONLY): Streptococcus, Group A Screen (Direct): POSITIVE — AB

## 2013-10-03 MED ORDER — AMOXICILLIN 500 MG PO CAPS: 500.0000 mg | ORAL_CAPSULE | Freq: Three times a day (TID) | ORAL | Status: DC

## 2013-10-03 MED ORDER — PHENYLEPH-PROMETHAZINE-COD 5-6.25-10 MG/5ML PO SYRP: 5.0000 mL | ORAL_SOLUTION | Freq: Four times a day (QID) | ORAL | Status: DC | PRN

## 2013-10-03 NOTE — ED Notes (Signed)
Pt reports intermittent headache,laryngitis, ear pain,congestion x1 month. Pt reports mild headache,left ear pain and sore throat today. Pt denies any n/v. Airway patent. nad noted.

## 2013-10-03 NOTE — Discharge Instructions (Signed)
Take tylenol and ibuprofen for fever and pain. Do not drive while taking the cough medication with codeine as it will make you sleepy. Follow up with your doctor or return here as needed.

## 2013-10-03 NOTE — ED Notes (Signed)
Patient seen and assessed by ED NP

## 2013-10-03 NOTE — ED Provider Notes (Signed)
CSN: [id number]     Arrival date & time 10/03/13  1504 History   First MD Initiated Contact with Patient 10/03/13 1542   This chart was scribed for non-physician practitioner Debroah Baller, NP working with Dorie Rank, MD by Rosary Lively, ED scribe. This patient was seen in room APFT21/APFT21 and the patient's care was started at 3:45 PM.    Chief Complaint  Patient presents with  . Sore Throat    Patient is a 48 y.o. female presenting with pharyngitis. The history is provided by the patient. No language interpreter was used.  Sore Throat This is a new problem. The current episode started more than 1 week ago. The problem occurs constantly. The problem has been gradually worsening. Associated symptoms include headaches. Nothing relieves the symptoms.   HPI Comments:  Kelli Thompson is a 48 y.o. female who presents to the Emergency Department complaining of a sore throat, onset 1 month ago. Pt reports it began as rhinorrhea, but has gradually progressed. Pt is experiencing associated symptoms of congestion, migraine, fever, discharge from the eyes bilaterally and otalgia. Pt reports that she has had laryngitis, and the migraine was so severe that she began to see black spots, and was unable to lay her head on the pillow.  Pt reports that grandchildren were recently sick. PCP is Dr. Nancy Nordmann.  Past Medical History  Diagnosis Date  . Hypertension   . Pancreatitis    Past Surgical History  Procedure Laterality Date  . Cesarean section      x2  . Cholecystectomy    . Appendectomy    . Abdominal hysterectomy     Family History  Problem Relation Age of Onset  . Arthritis    . Diabetes    . Hypertension Mother   . Hypertension Father    History  Substance Use Topics  . Smoking status: Former Research scientist (life sciences)  . Smokeless tobacco: Not on file  . Alcohol Use: No   OB History   Grav Para Term Preterm Abortions TAB SAB Ect Mult Living                 Review of Systems  Constitutional: Positive  for fever.  HENT: Positive for congestion, ear pain, rhinorrhea, sinus pressure and sore throat.   Respiratory: Positive for cough.   Gastrointestinal: Negative for nausea, vomiting and diarrhea.  Neurological: Positive for headaches.  All other systems negative    Allergies  Review of patient's allergies indicates no known allergies.  Home Medications   Prior to Admission medications   Medication Sig Start Date End Date Taking? Authorizing Provider  cephALEXin (KEFLEX) 500 MG capsule Take 1 capsule (500 mg total) by mouth every 12 (twelve) hours. 04/19/13   Radene Gunning, NP  oxyCODONE-acetaminophen (PERCOCET/ROXICET) 5-325 MG per tablet Take 2 tablets by mouth every 4 (four) hours as needed for severe pain. 04/19/13   Radene Gunning, NP   BP 176/115  Pulse 107  Temp(Src) 98.7 F (37.1 C) (Oral)  Resp 18  Ht 5\' 5"  (1.651 m)  Wt 208 lb (94.348 kg)  BMI 34.61 kg/m2  SpO2 98% Physical Exam  Nursing note and vitals reviewed. Constitutional: She is oriented to person, place, and time. She appears well-developed and well-nourished.  HENT:  Head: Normocephalic and atraumatic.  Mouth/Throat: Uvula is midline and mucous membranes are normal. Oropharyngeal exudate and posterior oropharyngeal erythema present.  Unable to view TMs due to cerumen.   Eyes: EOM are normal.  Neck: Normal range  of motion. Neck supple.  Cardiovascular: Tachycardia present.   Elevated BP  Pulmonary/Chest: Effort normal.  Abdominal: Soft. There is no tenderness.  Musculoskeletal: Normal range of motion.  Neurological: She is alert and oriented to person, place, and time.  Skin: Skin is warm and dry.  Psychiatric: She has a normal mood and affect. Her behavior is normal.    ED Course  Procedures  DIAGNOSTIC STUDIES: Oxygen Saturation is 98% on RA, normal by my interpretation.  COORDINATION OF CARE: 3:51 PM-Discussed treatment plan which includes antibiotics, and cough medicine with pt at bedside and pt  agreed to plan.  Labs Review Labs Reviewed  RAPID STREP SCREEN - Abnormal; Notable for the following:    Streptococcus, Group A Screen (Direct) POSITIVE (*)    All other components within normal limits    MDM   48 y.o. female with sore throat and congestion. Will treat for positive strep and she will follow up with her PCP for her elevated BP. She will return here as needed.  Discussed with the patient and all questioned fully answered. She will return if any problems arise.   Medication List    TAKE these medications       amoxicillin 500 MG capsule  Commonly known as:  AMOXIL  Take 1 capsule (500 mg total) by mouth 3 (three) times daily.     Phenyleph-Promethazine-Cod 5-6.25-10 MG/5ML Syrp  Take 5 mLs by mouth every 6 (six) hours as needed.      ASK your doctor about these medications       cephALEXin 500 MG capsule  Commonly known as:  KEFLEX  Take 1 capsule (500 mg total) by mouth every 12 (twelve) hours.     oxyCODONE-acetaminophen 5-325 MG per tablet  Commonly known as:  PERCOCET/ROXICET  Take 2 tablets by mouth every 4 (four) hours as needed for severe pain.           [facility] Bunnie Pion, NP 10/07/13 1300

## 2013-10-07 NOTE — ED Provider Notes (Signed)
Medical screening examination/treatment/procedure(s) were performed by non-physician practitioner and as supervising physician I was immediately available for consultation/collaboration.    Dorie Rank, MD 10/07/13 [phone]

## 2014-01-05 ENCOUNTER — Other Ambulatory Visit (HOSPITAL_COMMUNITY): Payer: Self-pay | Admitting: Family Medicine

## 2014-01-05 DIAGNOSIS — Z1231 Encounter for screening mammogram for malignant neoplasm of breast: Secondary | ICD-10-CM

## 2014-01-13 ENCOUNTER — Emergency Department (HOSPITAL_COMMUNITY)
Admission: EM | Admit: 2014-01-13 | Discharge: 2014-01-14 | Disposition: A | Discharge status: Home / Self Care | Payer: BC Managed Care – PPO | Attending: Emergency Medicine | Admitting: Emergency Medicine

## 2014-01-13 ENCOUNTER — Encounter (HOSPITAL_COMMUNITY): Payer: Self-pay | Admitting: *Deleted

## 2014-01-13 DIAGNOSIS — Z79899 Other long term (current) drug therapy: Secondary | ICD-10-CM | POA: Diagnosis not present

## 2014-01-13 DIAGNOSIS — Z8719 Personal history of other diseases of the digestive system: Secondary | ICD-10-CM | POA: Diagnosis not present

## 2014-01-13 DIAGNOSIS — R002 Palpitations: Secondary | ICD-10-CM | POA: Diagnosis not present

## 2014-01-13 DIAGNOSIS — I10 Essential (primary) hypertension: Secondary | ICD-10-CM | POA: Diagnosis not present

## 2014-01-13 DIAGNOSIS — Z87891 Personal history of nicotine dependence: Secondary | ICD-10-CM | POA: Insufficient documentation

## 2014-01-13 DIAGNOSIS — R51 Headache: Secondary | ICD-10-CM | POA: Diagnosis not present

## 2014-01-13 DIAGNOSIS — R0602 Shortness of breath: Secondary | ICD-10-CM | POA: Diagnosis not present

## 2014-01-13 DIAGNOSIS — E876 Hypokalemia: Secondary | ICD-10-CM | POA: Insufficient documentation

## 2014-01-13 NOTE — ED Provider Notes (Signed)
CSN: [id number]     Arrival date & time 01/13/14  2334 History  This chart was scribed for Kelli Fuel, MD by Jeanell Sparrow, ED Scribe. This patient was seen in room APA19/APA19 and the patient's care was started at 12:07 AM.   No chief complaint on file.  The history is provided by the patient. No language interpreter was used.   HPI Comments: Kelli Thompson is a 48 y.o. female who presents to the Emergency Department complaining of an episode of constant moderate SOB that started today. She states that she went to lay down for about 10 minutes when she felt her "heart racing". She reports that she could "hear her hear beating fast" and felt it in her neck. She states that the episode lasted about 10-15 minutes with nausea and SOB. She reports that her LLE started tingling and she got a headache. She reports that she has a hx of HTN and pancreatitis. She denies any hx of smoking or alcohol use. She denies any emesis.   PCP- Hilma Favors Past Medical History  Diagnosis Date  . Hypertension   . Pancreatitis    Past Surgical History  Procedure Laterality Date  . Cesarean section      x2  . Cholecystectomy    . Appendectomy    . Abdominal hysterectomy     Family History  Problem Relation Age of Onset  . Arthritis    . Diabetes    . Hypertension Mother   . Hypertension Father    History  Substance Use Topics  . Smoking status: Former Research scientist (life sciences)  . Smokeless tobacco: Not on file  . Alcohol Use: No   OB History    No data available     Review of Systems  Respiratory: Positive for shortness of breath.   Gastrointestinal: Positive for nausea. Negative for vomiting.  Neurological: Positive for headaches.  All other systems reviewed and are negative.   Allergies  Review of patient's allergies indicates no known allergies.  Home Medications   Prior to Admission medications   Medication Sig Start Date End Date Taking? Authorizing Provider  amLODipine (NORVASC) 10 MG tablet Take 10  mg by mouth daily.   Yes Historical Provider, MD  losartan (COZAAR) 100 MG tablet Take 100 mg by mouth daily.   Yes Historical Provider, MD   BP 158/97 mmHg  Pulse 91  Temp(Src) 97.9 F (36.6 C) (Oral)  Resp 20  Ht 5\' 5"  (1.651 m)  Wt 215 lb (97.523 kg)  BMI 35.78 kg/m2  SpO2 98% Physical Exam  Constitutional: She is oriented to person, place, and time. She appears well-developed and well-nourished. No distress.  HENT:  Head: Normocephalic and atraumatic.  Eyes: Pupils are equal, round, and reactive to light.  Neck: Normal range of motion. Neck supple. No JVD present. No tracheal deviation present.  Cardiovascular: Normal rate, regular rhythm and normal heart sounds.   No murmur heard. Pulmonary/Chest: Effort normal and breath sounds normal. No respiratory distress. She has no wheezes. She has no rales.  Abdominal: Soft. Bowel sounds are normal. She exhibits no distension and no mass. There is no tenderness.  Musculoskeletal: Normal range of motion. She exhibits no edema.  Lymphadenopathy:    She has no cervical adenopathy.  Neurological: She is alert and oriented to person, place, and time. She has normal reflexes. No cranial nerve deficit. Coordination normal.  Skin: Skin is warm and dry.  Psychiatric: She has a normal mood and affect. Her behavior is normal.  Thought content normal.  Appears somewhat anxious.  Nursing note and vitals reviewed.   ED Course  Procedures (including critical care time) DIAGNOSTIC STUDIES: Oxygen Saturation is 98% on RA, normal by my interpretation.    COORDINATION OF CARE: 12:11 AM- Pt advised of plan for treatment which includes medication and labs and pt agrees.  Labs Review Results for orders placed or performed during the hospital encounter of 35/57/32  Basic metabolic panel  Result Value Ref Range   Sodium 140 137 - 147 mEq/L   Potassium 3.1 (L) 3.7 - 5.3 mEq/L   Chloride 102 96 - 112 mEq/L   CO2 24 19 - 32 mEq/L   Glucose, Bld 120  (H) 70 - 99 mg/dL   BUN 16 6 - 23 mg/dL   Creatinine, Ser 0.91 0.50 - 1.10 mg/dL   Calcium 9.0 8.4 - 10.5 mg/dL   GFR calc non Af Amer 73 (L) >90 mL/min   GFR calc Af Amer 85 (L) >90 mL/min   Anion gap 14 5 - 15  CBC with Differential  Result Value Ref Range   WBC 7.9 4.0 - 10.5 K/uL   RBC 4.39 3.87 - 5.11 MIL/uL   Hemoglobin 12.9 12.0 - 15.0 g/dL   HCT 37.6 36.0 - 46.0 %   MCV 85.6 78.0 - 100.0 fL   MCH 29.4 26.0 - 34.0 pg   MCHC 34.3 30.0 - 36.0 g/dL   RDW 13.6 11.5 - 15.5 %   Platelets 227 150 - 400 K/uL   Neutrophils Relative % 69 43 - 77 %   Neutro Abs 5.4 1.7 - 7.7 K/uL   Lymphocytes Relative 23 12 - 46 %   Lymphs Abs 1.8 0.7 - 4.0 K/uL   Monocytes Relative 5 3 - 12 %   Monocytes Absolute 0.4 0.1 - 1.0 K/uL   Eosinophils Relative 3 0 - 5 %   Eosinophils Absolute 0.2 0.0 - 0.7 K/uL   Basophils Relative 0 0 - 1 %   Basophils Absolute 0.0 0.0 - 0.1 K/uL  Troponin I  Result Value Ref Range   Troponin I <0.30 <0.30 ng/mL    EKG Interpretation   Date/Time:  Saturday January 14 2014 00:07:48 EST Ventricular Rate:  87 PR Interval:  167 QRS Duration: 108 QT Interval:  414 QTC Calculation: 498 R Axis:   77 Text Interpretation:  Sinus rhythm Borderline prolonged QT interval When  compared with ECG of 04/16/2013, No significant change was found Confirmed  by [facility]  MD, Aneyah Lortz ([id number]) on 01/14/2014 12:16:37 AM      MDM   Final diagnoses:  Palpitations  Hypokalemia    Episode of palpitations which has resolved prior to arriving in the ED. Other symptoms seem likely due to combination of hyperventilation and anxiety. She is still somewhat anxious in the ED. She's given a dose of lorazepam. Workup including ECG and electrolytes shows hypokalemia. This is not likely to have influenced her operative dictations. Episode most likely was paroxysmal supraventricular tachycardia. I've expand this to patient and her spouse including the fact that I do not feel she needs additional  workup at this time. Recommended return to ED if symptoms recur and consider outpatient cardiac monitoring if she has recurrence of symptoms. She is given a dose of K-Dur in the ED and sent home with prescription for same   I personally performed the services described in this documentation, which was scribed in my presence. The recorded information has been reviewed and is accurate.  Kelli Fuel, MD 68/08/81 1031

## 2014-01-13 NOTE — ED Notes (Signed)
Pt c/o hearing her heart beating in her ears. Pt also c/o chest tightness and sob.

## 2014-01-14 LAB — BASIC METABOLIC PANEL
Anion gap: 14 (ref 5–15)
BUN: 16 mg/dL (ref 6–23)
CHLORIDE: 102 meq/L (ref 96–112)
CO2: 24 meq/L (ref 19–32)
CREATININE: 0.91 mg/dL (ref 0.50–1.10)
Calcium: 9 mg/dL (ref 8.4–10.5)
GFR calc Af Amer: 85 mL/min — ABNORMAL LOW (ref >90)
GFR calc non Af Amer: 73 mL/min — ABNORMAL LOW (ref >90)
GLUCOSE: 120 mg/dL — AB (ref 70–99)
Potassium: 3.1 mEq/L — ABNORMAL LOW (ref 3.7–5.3)
Sodium: 140 mEq/L (ref 137–147)

## 2014-01-14 LAB — TROPONIN I: Troponin I: <0.3 ng/mL (ref <0.30)

## 2014-01-14 LAB — CBC WITH DIFFERENTIAL/PLATELET
BASOS PCT: 0 % (ref 0–1)
Basophils Absolute: 0 10*3/uL (ref 0.0–0.1)
EOS ABS: 0.2 10*3/uL (ref 0.0–0.7)
Eosinophils Relative: 3 % (ref 0–5)
HEMATOCRIT: 37.6 % (ref 36.0–46.0)
HEMOGLOBIN: 12.9 g/dL (ref 12.0–15.0)
Lymphocytes Relative: 23 % (ref 12–46)
Lymphs Abs: 1.8 10*3/uL (ref 0.7–4.0)
MCH: 29.4 pg (ref 26.0–34.0)
MCHC: 34.3 g/dL (ref 30.0–36.0)
MCV: 85.6 fL (ref 78.0–100.0)
MONOS PCT: 5 % (ref 3–12)
Monocytes Absolute: 0.4 10*3/uL (ref 0.1–1.0)
Neutro Abs: 5.4 10*3/uL (ref 1.7–7.7)
Neutrophils Relative %: 69 % (ref 43–77)
Platelets: 227 10*3/uL (ref 150–400)
RBC: 4.39 MIL/uL (ref 3.87–5.11)
RDW: 13.6 % (ref 11.5–15.5)
WBC: 7.9 10*3/uL (ref 4.0–10.5)

## 2014-01-14 MED ORDER — POTASSIUM CHLORIDE CRYS ER 20 MEQ PO TBCR
40.0000 meq | EXTENDED_RELEASE_TABLET | Freq: Once | ORAL | Status: AC
  Administered 2014-01-14: 40 meq via ORAL
  Filled 2014-01-14: qty 2

## 2014-01-14 MED ORDER — POTASSIUM CHLORIDE CRYS ER 20 MEQ PO TBCR: 20.0000 meq | EXTENDED_RELEASE_TABLET | Freq: Two times a day (BID) | ORAL | Status: DC

## 2014-01-14 MED ORDER — LORAZEPAM 2 MG/ML IJ SOLN
1.0000 mg | Freq: Once | INTRAMUSCULAR | Status: AC
  Administered 2014-01-14: 1 mg via INTRAVENOUS
  Filled 2014-01-14: qty 1

## 2014-01-14 NOTE — Discharge Instructions (Signed)
Palpitations A palpitation is the feeling that your heartbeat is irregular or is faster than normal. It may feel like your heart is fluttering or skipping a beat. Palpitations are usually not a serious problem. However, in some cases, you may need further medical evaluation. CAUSES  Palpitations can be caused by:  Smoking.  Caffeine or other stimulants, such as diet pills or energy drinks.  Alcohol.  Stress and anxiety.  Strenuous physical activity.  Fatigue.  Certain medicines.  Heart disease, especially if you have a history of irregular heart rhythms (arrhythmias), such as atrial fibrillation, atrial flutter, or supraventricular tachycardia.  An improperly working pacemaker or defibrillator. DIAGNOSIS  To find the cause of your palpitations, your health care provider will take your medical history and perform a physical exam. Your health care provider may also have you take a test called an ambulatory electrocardiogram (ECG). An ECG records your heartbeat patterns over a 24-hour period. You may also have other tests, such as:  Transthoracic echocardiogram (TTE). During echocardiography, sound waves are used to evaluate how blood flows through your heart.  Transesophageal echocardiogram (TEE).  Cardiac monitoring. This allows your health care provider to monitor your heart rate and rhythm in real time.  Holter monitor. This is a portable device that records your heartbeat and can help diagnose heart arrhythmias. It allows your health care provider to track your heart activity for several days, if needed.  Stress tests by exercise or by giving medicine that makes the heart beat faster. TREATMENT  Treatment of palpitations depends on the cause of your symptoms and can vary greatly. Most cases of palpitations do not require any treatment other than time, relaxation, and monitoring your symptoms. Other causes, such as atrial fibrillation, atrial flutter, or supraventricular  tachycardia, usually require further treatment. HOME CARE INSTRUCTIONS   Avoid:  Caffeinated coffee, tea, soft drinks, diet pills, and energy drinks.  Chocolate.  Alcohol.  Stop smoking if you smoke.  Reduce your stress and anxiety. Things that can help you relax include:  A method of controlling things in your body, such as your heartbeats, with your mind (biofeedback).  Yoga.  Meditation.  Physical activity such as swimming, jogging, or walking.  Get plenty of rest and sleep. SEEK MEDICAL CARE IF:   You continue to have a fast or irregular heartbeat beyond 24 hours.  Your palpitations occur more often. SEEK IMMEDIATE MEDICAL CARE IF:  You have chest pain or shortness of breath.  You have a severe headache.  You feel dizzy or you faint. MAKE SURE YOU:  Understand these instructions.  Will watch your condition.  Will get help right away if you are not doing well or get worse. Document Released: 01/11/2000 Document Revised: 01/18/2013 Document Reviewed: 03/14/2011 [facility] Patient Information 2015 [place], [place]. This information is not intended to replace advice given to you by your health care provider. Make sure you discuss any questions you have with your health care provider.  Potassium Salts tablets, extended-release tablets or capsules What is this medicine? POTASSIUM (poe TASS i um) is a natural salt that is important for the heart, muscles, and nerves. It is found in many foods and is normally supplied by a well balanced diet. This medicine is used to treat low potassium. This medicine may be used for other purposes; ask your health care provider or pharmacist if you have questions. COMMON BRAND NAME(S): ED-K+10, Glu-K, K-10, K-8, K-Dur, K-Tab, Kaon-CL, Klor-Con, Klor-Con M10, Klor-Con M15, Klor-Con M20, Klotrix, Micro-K, Micro-K Extencaps, Slow-K  What should I tell my health care provider before I take this medicine? They need to know if you have any of  these conditions: -dehydration -diabetes -irregular heartbeat -kidney disease -stomach ulcers or other stomach problems -an unusual or allergic reaction to potassium salts, other medicines, foods, dyes, or preservatives -pregnant or trying to get pregnant -breast-feeding How should I use this medicine? Take this medicine by mouth with a full glass of water. Follow the directions on the prescription label. Take with food. Do not suck on, crush, or chew this medicine. If you have difficulty swallowing, ask the pharmacist how to take. Take your medicine at regular intervals. Do not take it more often than directed. Do not stop taking except on your doctor's advice. Talk to your pediatrician regarding the use of this medicine in children. Special care may be needed. Overdosage: If you think you have taken too much of this medicine contact a poison control center or emergency room at once. NOTE: This medicine is only for you. Do not share this medicine with others. What if I miss a dose? If you miss a dose, take it as soon as you can. If it is almost time for your next dose, take only that dose. Do not take double or extra doses. What may interact with this medicine? Do not take this medicine with any of the following medications: -eplerenone -sodium polystyrene sulfonate This medicine may also interact with the following medications: -medicines for blood pressure or heart disease like lisinopril, losartan, quinapril, valsartan -medicines for cold or allergies -medicines for inflammation like ibuprofen, indomethacin -medicines for Parkinson's disease -medicines for the stomach like metoclopramide, dicyclomine, glycopyrrolate -some diuretics This list may not describe all possible interactions. Give your health care provider a list of all the medicines, herbs, non-prescription drugs, or dietary supplements you use. Also tell them if you smoke, drink alcohol, or use illegal drugs. Some items may  interact with your medicine. What should I watch for while using this medicine? Visit your doctor or health care professional for regular check ups. You will need lab work done regularly. You may need to be on a special diet while taking this medicine. Ask your doctor. What side effects may I notice from receiving this medicine? Side effects that you should report to your doctor or health care professional as soon as possible: -allergic reactions like skin rash, itching or hives, swelling of the face, lips, or tongue -black, tarry stools -heartburn -irregular heartbeat -numbness or tingling in hands or feet -pain when swallowing -unusually weak or tired Side effects that usually do not require medical attention (report to your doctor or health care professional if they continue or are bothersome): -diarrhea -nausea -stomach gas -vomiting This list may not describe all possible side effects. Call your doctor for medical advice about side effects. You may report side effects to FDA at 1-800-FDA-1088. Where should I keep my medicine? Keep out of the reach of children. Store at room temperature between 15 and 30 degrees C (59 and 86 degrees F ). Keep bottle closed tightly to protect this medicine from light and moisture. Throw away any unused medicine after the expiration date. NOTE: This sheet is a summary. It may not cover all possible information. If you have questions about this medicine, talk to your doctor, pharmacist, or health care provider.  2015, [employer]. (2007-03-31 11:17:31)

## 2014-01-16 ENCOUNTER — Encounter: Payer: Self-pay | Admitting: *Deleted

## 2014-01-16 ENCOUNTER — Ambulatory Visit (HOSPITAL_COMMUNITY)
Admission: RE | Admit: 2014-01-16 | Discharge: 2014-01-16 | Disposition: A | Discharge status: Home / Self Care | Payer: BC Managed Care – PPO | Source: Ambulatory Visit | Attending: Family Medicine | Admitting: Family Medicine

## 2014-01-16 DIAGNOSIS — Z1231 Encounter for screening mammogram for malignant neoplasm of breast: Secondary | ICD-10-CM | POA: Diagnosis not present

## 2014-01-30 ENCOUNTER — Encounter: Payer: Self-pay | Admitting: Adult Health

## 2014-01-30 ENCOUNTER — Ambulatory Visit (INDEPENDENT_AMBULATORY_CARE_PROVIDER_SITE_OTHER): Payer: BLUE CROSS/BLUE SHIELD | Admitting: Adult Health

## 2014-01-30 VITALS — BP 130/84 | HR 76 | Ht 65.0 in | Wt 212.5 lb

## 2014-01-30 DIAGNOSIS — Z01419 Encounter for gynecological examination (general) (routine) without abnormal findings: Secondary | ICD-10-CM

## 2014-01-30 DIAGNOSIS — Z1212 Encounter for screening for malignant neoplasm of rectum: Secondary | ICD-10-CM

## 2014-01-30 DIAGNOSIS — R350 Frequency of micturition: Secondary | ICD-10-CM

## 2014-01-30 DIAGNOSIS — N3946 Mixed incontinence: Secondary | ICD-10-CM

## 2014-01-30 HISTORY — DX: Frequency of micturition: R35.0

## 2014-01-30 HISTORY — DX: Mixed incontinence: N39.46

## 2014-01-30 LAB — HEMOCCULT GUIAC POC 1CARD (OFFICE): Fecal Occult Blood, POC: NEGATIVE

## 2014-01-30 MED ORDER — MIRABEGRON ER 25 MG PO TB24: 25.0000 mg | ORAL_TABLET | Freq: Every day | ORAL | Status: DC

## 2014-01-30 NOTE — Patient Instructions (Signed)
Overactive Bladder The bladder has two functions that are totally opposite of the other. One is to relax and stretch out so it can store urine (fills like a balloon), and the other is to contract and squeeze down so that it can empty the urine that it has stored. Proper functioning of the bladder is a complex mixing of these two functions. The filling and emptying of the bladder can be influenced by:  The bladder.  The spinal cord.  The brain.  The nerves going to the bladder.  Other organs that are closely related to the bladder such as prostate in males and the vagina in females. As your bladder fills with urine, nerve signals are sent from the bladder to the brain to tell you that you may need to urinate. Normal urination requires that the bladder squeeze down with sufficient strength to empty the bladder, but this also requires that the bladder squeeze down sufficiently long to finish the job. In addition the sphincter muscles, which normally keep you from leaking urine, must also relax so that the urine can pass. Coordination between the bladder muscle squeezing down and the sphincter muscles relaxing is required to make everything happen normally. With an overactive bladder sometimes the muscles of the bladder contract unexpectedly and involuntarily and this causes an urgent need to urinate. The normal response is to try to hold urine in by contracting the sphincter muscles. Sometimes the bladder contracts so strongly that the sphincter muscles cannot stop the urine from passing out and incontinence occurs. This kind of incontinence is called urge incontinence. Having an overactive bladder can be embarrassing and awkward. It can keep you from living life the way you want to. Many people think it is just something you have to put up with as you grow older or have certain health conditions. In fact, there are treatments that can help make your life easier and more pleasant. CAUSES  Many things  can cause an overactive bladder. Possibilities include:  Urinary tract infection or infection of nearby tissues such as the prostate.  Prostate enlargement.  In women, multiple pregnancies or surgery on the uterus or urethra.  Bladder stones, inflammation, or tumors.  Caffeine.  Alcohol.  Medications. For example, diuretics (drugs that help the body get rid of extra fluid) increase urine production. Some other medicines must be taken with lots of fluids.  Muscle or nerve weakness. This might be the result of a spinal cord injury, a stroke, multiple sclerosis, or Parkinson disease.  Diabetes can cause a high urine volume which fills the bladder so quickly that the normal urge to urinate is triggered very strongly. SYMPTOMS   Loss of bladder control. You feel the need to urinate and cannot make your body wait.  Sudden, strong urges to urinate.  Urinating 8 or more times a day.  Waking up to urinate two or more times a night. DIAGNOSIS  To decide if you have overactive bladder, your health care provider will probably:  Ask about symptoms you have noticed.  Ask about your overall health. This will include questions about any medications you are taking.  Do a physical examination. This will help determine if there are obvious blockages or other problems.  Order some tests. These might include:  A blood test to check for diabetes or other health issues that could be contributing to the problem.  Urine testing. This could measure the flow of urine and the pressure on the bladder.  A test of your neurological   system (the brain, spinal cord, and nerves). This is the system that senses the need to urinate. Some of these tests are called flow tests, bladder pressure tests, and electrical measurements of the sphincter muscle.  A bladder test to check whether it is emptying completely when you urinate.  Cystoscopy. This test uses a thin tube with a tiny camera on it. It offers a  look inside your urethra and bladder to see if there are problems.  Imaging tests. You might be given a contrast dye and then asked to urinate. X-rays are taken to see how your bladder is working. TREATMENT  An overactive bladder can be treated in many ways. The treatment will depend on the cause. Whether you have a mild or severe case also makes a difference. Often, treatment can be given in your health care provider's office or clinic. Be sure to discuss the different options with your caregiver. They include:  Behavioral treatments. These do not involve medication or surgery:  Bladder training. For this, you would follow a schedule to urinate at regular intervals. This helps you learn to control the urge to urinate. At first, you might be asked to wait a few minutes after feeling the urge. In time, you should be able to schedule bathroom visits an hour or more apart.  Kegel exercises. These exercises strengthen the pelvic floor muscles, which support the bladder. Toning these muscles can help control urination even if the bladder muscles are overactive. A specialist will teach you how to do these exercises correctly. They will require daily practice.  Weight loss. If you are obese or overweight, losing weight might stop your bladder from being overactive. Talk to your health care provider about how many pounds you should lose. Also ask if there is a specific program or method that would work best for you.  Diet change. This might be suggested if constipation is making your overactive bladder worse. Your health care provider or a nutritionist can explain ways to change what you eat to ease constipation. Other people might need to take in less caffeine or alcohol. Sometimes drinking fewer fluids is needed, too.  Protection. This is not an actual treatment. But, you could wear special pads to take care of any leakage while you wait for other treatments to take effect. This will help you avoid  embarrassment.  Physical treatments.  Electrical stimulation. Electrodes will send gentle pulses to the nerves or muscles that help control the bladder. The goal is to strengthen them. Sometimes this is done with the electrodes outside the body. Or, they might be placed inside the body (implanted). This treatment can take several months to have an effect.  Medications. These are usually used along with other treatments. Several medicines are available. Some are injected into the muscles involved in urination. Others come in pill form. Medications sometimes prescribed include:  Anticholinergics. These drugs block the signals that the nerves deliver to the bladder. This keeps it from releasing urine at the wrong time. Researchers think the drugs might help in other ways, too.  Imipramine. This is an antidepressant. But, it relaxes bladder muscles.  Botox. This is still experimental. Some people believe that injecting it into the bladder muscles will relax them so they work more normally. It has also been injected into the sphincter muscle when the sphincter muscle does not open properly. This is a temporary fix, however. Also, it might make matters worse, especially in older people.  Surgery.  A device might be implanted   to help manage your nerves. It works on the nerves that signal when you need to urinate.  Surgery is sometimes needed with electrical stimulation. If the electrodes are implanted, this is done through surgery.  Sometimes repairs need to be made through surgery. For example, the size of the bladder can be changed. This is usually done in severe cases only. HOME CARE INSTRUCTIONS   Take any medications your health care provider prescribed or suggested. Follow the directions carefully.  Practice any lifestyle changes that are recommended. These might include:  Drinking less fluid or drinking at different times of the day. If you need to urinate often during the night, for  example, you may need to stop drinking fluids early in the evening.  Cutting down on caffeine or alcohol. They can both make an overactive bladder worse. Caffeine is found in coffee, tea, and sodas.  Doing Kegel exercises to strengthen muscles.  Losing weight, if that is recommended.  Eating a healthy and balanced diet. This will help you avoid constipation.  Keep a journal or a log. You might be asked to record how much you drink and when, and also when you feel the need to urinate.  Learn how to care for implants or other devices, such as pessaries. SEEK MEDICAL CARE IF:   Your overactive bladder gets worse.  You feel increased pain or irritation when you urinate.  You notice blood in your urine.  You have questions about any medications or devices that your health care provider recommended.  You notice blood, pus, or swelling at the site of any test or treatment procedure.  You have an oral temperature above 102F (38.9C). SEEK IMMEDIATE MEDICAL CARE IF:  You have an oral temperature above 102F (38.9C), not controlled by medicine. Document Released: 11/09/2008 Document Revised: 05/30/2013 Document Reviewed: 11/09/2008 [facility] Patient Information 2015 [place], [place]. This information is not intended to replace advice given to you by your health care provider. Make sure you discuss any questions you have with your health care provider. Urinary Incontinence Urinary incontinence is the involuntary loss of urine from your bladder. CAUSES  There are many causes of urinary incontinence. They include:  Medicines.  Infections.  Prostatic enlargement, leading to overflow of urine from your bladder.  Surgery.  Neurological diseases.  Emotional factors. SIGNS AND SYMPTOMS Urinary Incontinence can be divided into four types:  Urge incontinence. Urge incontinence is the involuntary loss of urine before you have the opportunity to go to the bathroom. There is a sudden urge  to void but not enough time to reach a bathroom.  Stress incontinence. Stress incontinence is the sudden loss of urine with any activity that forces urine to pass. It is commonly caused by anatomical changes to the pelvis and sphincter areas of your body.  Overflow incontinence. Overflow incontinence is the loss of urine from an obstructed opening to your bladder. This results in a backup of urine and a resultant buildup of pressure within the bladder. When the pressure within the bladder exceeds the closing pressure of the sphincter, the urine overflows, which causes incontinence, similar to water overflowing a dam.  Total incontinence. Total incontinence is the loss of urine as a result of the inability to store urine within your bladder. DIAGNOSIS  Evaluating the cause of incontinence may require:  A thorough and complete medical and obstetric history.  A complete physical exam.  Laboratory tests such as a urine culture and sensitivities. When additional tests are indicated, they can include:  An ultrasound exam.  Kidney and bladder X-rays.  Cystoscopy. This is an exam of the bladder using a narrow scope.  Urodynamic testing to test the nerve function to the bladder and sphincter areas. TREATMENT  Treatment for urinary incontinence depends on the cause:  For urge incontinence caused by a bacterial infection, antibiotics will be prescribed. If the urge incontinence is related to medicines you take, your health care provider may have you change the medicine.  For stress incontinence, surgery to re-establish anatomical support to the bladder or sphincter, or both, will often correct the condition.  For overflow incontinence caused by an enlarged prostate, an operation to open the channel through the enlarged prostate will allow the flow of urine out of the bladder. In women with fibroids, a hysterectomy may be recommended.  For total incontinence, surgery on your urinary sphincter  may help. An artificial urinary sphincter (an inflatable cuff placed around the urethra) may be required. In women who have developed a hole-like passage between their bladder and vagina (vesicovaginal fistula), surgery to close the fistula often is required. HOME CARE INSTRUCTIONS  Normal daily hygiene and the use of pads or adult diapers that are changed regularly will help prevent odors and skin damage.  Avoid caffeine. It can overstimulate your bladder.  Use the bathroom regularly. Try about every 2-3 hours to go to the bathroom, even if you do not feel the need to do so. Take time to empty your bladder completely. After urinating, wait a minute. Then try to urinate again.  For causes involving nerve dysfunction, keep a log of the medicines you take and a journal of the times you go to the bathroom. SEEK MEDICAL CARE IF:  You experience worsening of pain instead of improvement in pain after your procedure.  Your incontinence becomes worse instead of better. SEE IMMEDIATE MEDICAL CARE IF:  You experience fever or shaking chills.  You are unable to pass your urine.  You have redness spreading into your groin or down into your thighs. MAKE SURE YOU:   Understand these instructions.   Will watch your condition.  Will get help right away if you are not doing well or get worse. Document Released: 02/21/2004 Document Revised: 11/03/2012 Document Reviewed: 06/22/2012 [facility] Patient Information 2015 [place], [place]. This information is not intended to replace advice given to you by your health care provider. Make sure you discuss any questions you have with your health care provider. Return in  4 weeks for follow up Physical in 1 year

## 2014-01-30 NOTE — Progress Notes (Signed)
Patient ID: Kelli Thompson, female   DOB: Jun 01, 1965, 49 y.o.   MRN: [id number] History of Present Illness:  Kelli Thompson is a 49 year old white female, married,new to this practice, in for gyn exam and complains of urinary frequency and mixed UI.Had pancreatitis in spring.  Current Medications, Allergies, Past Medical History, Past Surgical History, Family History and Social History were reviewed in [employer] record.     Review of Systems: Patient denies any headaches, blurred vision, shortness of breath, chest pain, abdominal pain, problems with bowel movements,  or intercourse. Has swelling of ankles at times and is moody per her husband, occasional hot flash, wakes up at night to void 1-6 times. No dysuria.    Physical Exam:BP 130/84 mmHg  Pulse 76  Ht 5\' 5"  (1.651 m)  Wt 212 lb 8 oz (96.389 kg)  BMI 35.36 kg/m2 General:  Well developed, well nourished, no acute distress Skin:  Warm and dry Neck:  Midline trachea, enlarged thyroid R>L, had biopsy in past was goiter Lungs; Clear to auscultation bilaterally Breast:  No dominant palpable mass, retraction, or nipple discharge,had normal mammogram in December  Cardiovascular: Regular rate and rhythm Abdomen:  Soft, non tender, no hepatosplenomegaly Pelvic:  External genitalia is normal in appearance, no lesions.  The vagina has decrease rugae and is pale, fair moisture.The cervix and uterus are absent.  No  adnexal masses or tenderness noted. Rectal: Good sphincter tone, no polyps, or hemorrhoids felt.  Hemoccult negative. 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

## 2014-02-28 ENCOUNTER — Encounter: Payer: Self-pay | Admitting: Adult Health

## 2014-02-28 ENCOUNTER — Ambulatory Visit (INDEPENDENT_AMBULATORY_CARE_PROVIDER_SITE_OTHER): Payer: BLUE CROSS/BLUE SHIELD | Admitting: Adult Health

## 2014-02-28 VITALS — BP 122/82 | Ht 65.0 in | Wt 215.0 lb

## 2014-02-28 DIAGNOSIS — R350 Frequency of micturition: Secondary | ICD-10-CM

## 2014-02-28 DIAGNOSIS — N951 Menopausal and female climacteric states: Secondary | ICD-10-CM

## 2014-02-28 DIAGNOSIS — N3946 Mixed incontinence: Secondary | ICD-10-CM

## 2014-02-28 HISTORY — DX: Menopausal and female climacteric states: N95.1

## 2014-02-28 NOTE — Patient Instructions (Signed)
Menopause Menopause is the normal time of life when menstrual periods stop completely. Menopause is complete when you have missed 12 consecutive menstrual periods. It usually occurs between the ages of 48 years and 55 years. Very rarely does a woman develop menopause before the age of 40 years. At menopause, your ovaries stop producing the female hormones estrogen and progesterone. This can cause undesirable symptoms and also affect your health. Sometimes the symptoms may occur 4-5 years before the menopause begins. There is no relationship between menopause and:  Oral contraceptives.  Number of children you had.  Race.  The age your menstrual periods started (menarche). Heavy smokers and very thin women may develop menopause earlier in life. CAUSES  The ovaries stop producing the female hormones estrogen and progesterone.  Other causes include:  Surgery to remove both ovaries.  The ovaries stop functioning for no known reason.  Tumors of the pituitary gland in the brain.  Medical disease that affects the ovaries and hormone production.  Radiation treatment to the abdomen or pelvis.  Chemotherapy that affects the ovaries. SYMPTOMS   Hot flashes.  Night sweats.  Decrease in sex drive.  Vaginal dryness and thinning of the vagina causing painful intercourse.  Dryness of the skin and developing wrinkles.  Headaches.  Tiredness.  Irritability.  Memory problems.  Weight gain.  Bladder infections.  Hair growth of the face and chest.  Infertility. More serious symptoms include:  Loss of bone (osteoporosis) causing breaks (fractures).  Depression.  Hardening and narrowing of the arteries (atherosclerosis) causing heart attacks and strokes. DIAGNOSIS   When the menstrual periods have stopped for 12 straight months.  Physical exam.  Hormone studies of the blood. TREATMENT  There are many treatment choices and nearly as many questions about them. The  decisions to treat or not to treat menopausal changes is an individual choice made with your health care provider. Your health care provider can discuss the treatments with you. Together, you can decide which treatment will work best for you. Your treatment choices may include:   Hormone therapy (estrogen and progesterone).  Non-hormonal medicines.  Treating the individual symptoms with medicine (for example antidepressants for depression).  Herbal medicines that may help specific symptoms.  Counseling by a psychiatrist or psychologist.  Group therapy.  Lifestyle changes including:  Eating healthy.  Regular exercise.  Limiting caffeine and alcohol.  Stress management and meditation.  No treatment. HOME CARE INSTRUCTIONS   Take the medicine your health care provider gives you as directed.  Get plenty of sleep and rest.  Exercise regularly.  Eat a diet that contains calcium (good for the bones) and soy products (acts like estrogen hormone).  Avoid alcoholic beverages.  Do not smoke.  If you have hot flashes, dress in layers.  Take supplements, calcium, and vitamin D to strengthen bones.  You can use over-the-counter lubricants or moisturizers for vaginal dryness.  Group therapy is sometimes very helpful.  Acupuncture may be helpful in some cases. SEEK MEDICAL CARE IF:   You are not sure you are in menopause.  You are having menopausal symptoms and need advice and treatment.  You are still having menstrual periods after age 55 years.  You have pain with intercourse.  Menopause is complete (no menstrual period for 12 months) and you develop vaginal bleeding.  You need a referral to a specialist (gynecologist, psychiatrist, or psychologist) for treatment. SEEK IMMEDIATE MEDICAL CARE IF:   You have severe depression.  You have excessive vaginal bleeding.    You fell and think you have a broken bone.  You have pain when you urinate.  You develop leg or  chest pain.  You have a fast pounding heart beat (palpitations).  You have severe headaches.  You develop vision problems.  You feel a lump in your breast.  You have abdominal pain or severe indigestion. Document Released: 04/05/2003 Document Revised: 09/15/2012 Document Reviewed: 08/12/2012 [facility] Patient Information 2015 [place], [place]. This information is not intended to replace advice given to you by your health care provider. Make sure you discuss any questions you have with your health care provider. Perimenopause Perimenopause is the time when your body begins to move into the menopause (no menstrual period for 12 straight months). It is a natural process. Perimenopause can begin 2-8 years before the menopause and usually lasts for 1 year after the menopause. During this time, your ovaries may or may not produce an egg. The ovaries vary in their production of estrogen and progesterone hormones each month. This can cause irregular menstrual periods, difficulty getting pregnant, vaginal bleeding between periods, and uncomfortable symptoms. CAUSES  Irregular production of the ovarian hormones, estrogen and progesterone, and not ovulating every month.  Other causes include:  Tumor of the pituitary gland in the brain.  Medical disease that affects the ovaries.  Radiation treatment.  Chemotherapy.  Unknown causes.  Heavy smoking and excessive alcohol intake can bring on perimenopause sooner. SIGNS AND SYMPTOMS   Hot flashes.  Night sweats.  Irregular menstrual periods.  Decreased sex drive.  Vaginal dryness.  Headaches.  Mood swings.  Depression.  Memory problems.  Irritability.  Tiredness.  Weight gain.  Trouble getting pregnant.  The beginning of losing bone cells (osteoporosis).  The beginning of hardening of the arteries (atherosclerosis). DIAGNOSIS  Your health care provider will make a diagnosis by analyzing your age, menstrual history, and  symptoms. He or she will do a physical exam and note any changes in your body, especially your female organs. Female hormone tests may or may not be helpful depending on the amount of female hormones you produce and when you produce them. However, other hormone tests may be helpful to rule out other problems. TREATMENT  In some cases, no treatment is needed. The decision on whether treatment is necessary during the perimenopause should be made by you and your health care provider based on how the symptoms are affecting you and your lifestyle. Various treatments are available, such as:  Treating individual symptoms with a specific medicine for that symptom.  Herbal medicines that can help specific symptoms.  Counseling.  Group therapy. HOME CARE INSTRUCTIONS   Keep track of your menstrual periods (when they occur, how heavy they are, how long between periods, and how long they last) as well as your symptoms and when they started.  Only take over-the-counter or prescription medicines as directed by your health care provider.  Sleep and rest.  Exercise.  Eat a diet that contains calcium (good for your bones) and soy (acts like the estrogen hormone).  Do not smoke.  Avoid alcoholic beverages.  Take vitamin supplements as recommended by your health care provider. Taking vitamin E may help in certain cases.  Take calcium and vitamin D supplements to help prevent bone loss.  Group therapy is sometimes helpful.  Acupuncture may help in some cases. SEEK MEDICAL CARE IF:   You have questions about any symptoms you are having.  You need a referral to a specialist (gynecologist, psychiatrist, or psychologist). SEEK IMMEDIATE  MEDICAL CARE IF:   You have vaginal bleeding.  Your period lasts longer than 8 days.  Your periods are recurring sooner than 21 days.  You have bleeding after intercourse.  You have severe depression.  You have pain when you urinate.  You have severe  headaches.  You have vision problems. Document Released: 02/21/2004 Document Revised: 11/03/2012 Document Reviewed: 08/12/2012 [facility] Patient Information 2015 [place], [place]. This information is not intended to replace advice given to you by your health care provider. Make sure you discuss any questions you have with your health care provider. Continue myrbetriq  Follow up prn

## 2014-02-28 NOTE — Progress Notes (Addendum)
Subjective:     Patient ID: Kelli Thompson, female   DOB: 03/08/1965, 49 y.o.   MRN: [id number]  HPI Kelli Thompson is a 49 year old white female back in follow up of starting myrbetriq.Has had good results, with less frequency of urination, esp at night.She is complaining of being moody.  Review of Systems See HPI Reviewed past medical,surgical, social and family history. Reviewed medications and allergies.     Objective:   Physical Exam BP 122/82 mmHg  Ht 5\' 5"  (1.651 m)  Wt 215 lb (97.523 kg)  BMI 35.78 kg/m2   Reviewed labs she brought with her from 01/05/14. Discussed that she is better has decreased being up 5-6 x at night to 1-2, but now moody and has had weird dreams and chest pressure afterwards, saw Dr Hilma Favors today and is being referred for cardio consult.Discussed could try estrogen therapy or SSRI or she could increase exercise and take time for self to see how she does, husband seems to be the target of her moods.  Assessment:     Urinary frequency Mixed UI Peri menopause    Plan:     Continue myrbetriq Review handout on peri menopause and menopause, call if wants meds Follow up prn

## 2014-03-02 ENCOUNTER — Encounter (HOSPITAL_COMMUNITY): Payer: Self-pay | Admitting: Emergency Medicine

## 2014-03-02 ENCOUNTER — Emergency Department (HOSPITAL_COMMUNITY)
Admission: EM | Admit: 2014-03-02 | Discharge: 2014-03-02 | Disposition: A | Discharge status: Home / Self Care | Payer: BLUE CROSS/BLUE SHIELD | Attending: Emergency Medicine | Admitting: Emergency Medicine

## 2014-03-02 DIAGNOSIS — Z8742 Personal history of other diseases of the female genital tract: Secondary | ICD-10-CM | POA: Diagnosis not present

## 2014-03-02 DIAGNOSIS — Z8719 Personal history of other diseases of the digestive system: Secondary | ICD-10-CM | POA: Diagnosis not present

## 2014-03-02 DIAGNOSIS — Z79899 Other long term (current) drug therapy: Secondary | ICD-10-CM | POA: Diagnosis not present

## 2014-03-02 DIAGNOSIS — I10 Essential (primary) hypertension: Secondary | ICD-10-CM | POA: Insufficient documentation

## 2014-03-02 DIAGNOSIS — R0602 Shortness of breath: Secondary | ICD-10-CM | POA: Insufficient documentation

## 2014-03-02 DIAGNOSIS — Z8659 Personal history of other mental and behavioral disorders: Secondary | ICD-10-CM | POA: Insufficient documentation

## 2014-03-02 DIAGNOSIS — R55 Syncope and collapse: Secondary | ICD-10-CM

## 2014-03-02 DIAGNOSIS — R61 Generalized hyperhidrosis: Secondary | ICD-10-CM | POA: Insufficient documentation

## 2014-03-02 DIAGNOSIS — R110 Nausea: Secondary | ICD-10-CM | POA: Diagnosis not present

## 2014-03-02 DIAGNOSIS — Z87891 Personal history of nicotine dependence: Secondary | ICD-10-CM | POA: Diagnosis not present

## 2014-03-02 DIAGNOSIS — E876 Hypokalemia: Secondary | ICD-10-CM | POA: Diagnosis not present

## 2014-03-02 DIAGNOSIS — R002 Palpitations: Secondary | ICD-10-CM | POA: Diagnosis not present

## 2014-03-02 DIAGNOSIS — R5383 Other fatigue: Secondary | ICD-10-CM | POA: Diagnosis not present

## 2014-03-02 LAB — I-STAT CHEM 8, ED
BUN: 15 mg/dL (ref 6–23)
Calcium, Ion: 1.16 mmol/L (ref 1.12–1.23)
Chloride: 103 mmol/L (ref 96–112)
Creatinine, Ser: 1.1 mg/dL (ref 0.50–1.10)
Glucose, Bld: 151 mg/dL — ABNORMAL HIGH (ref 70–99)
HCT: 40 % (ref 36.0–46.0)
Hemoglobin: 13.6 g/dL (ref 12.0–15.0)
Potassium: 2.9 mmol/L — ABNORMAL LOW (ref 3.5–5.1)
SODIUM: 142 mmol/L (ref 135–145)
TCO2: 22 mmol/L (ref 0–100)

## 2014-03-02 MED ORDER — POTASSIUM CHLORIDE CRYS ER 20 MEQ PO TBCR: 20.0000 meq | EXTENDED_RELEASE_TABLET | Freq: Every day | ORAL | Status: DC

## 2014-03-02 MED ORDER — POTASSIUM CHLORIDE CRYS ER 20 MEQ PO TBCR
40.0000 meq | EXTENDED_RELEASE_TABLET | Freq: Once | ORAL | Status: AC
  Administered 2014-03-02: 40 meq via ORAL
  Filled 2014-03-02: qty 2

## 2014-03-02 NOTE — ED Notes (Signed)
Pt was having heart palpitations and stood up and fell backwards per husband. Pt did have loc.

## 2014-03-02 NOTE — ED Provider Notes (Signed)
CSN: [id number]     Arrival date & time 03/02/14  0001 History  This chart was scribed for Sharyon Cable, MD by Edison Simon, ED Scribe. This patient was seen in room APA05/APA05 and the patient's care was started at 12:22 AM.    Chief Complaint  Patient presents with  . Loss of Consciousness   Patient is a 49 y.o. female presenting with syncope. The history is provided by the patient. No language interpreter was used.  Loss of Consciousness Associated symptoms: diaphoresis, nausea, palpitations and shortness of breath   Associated symptoms: no chest pain, no fever, no headaches, no seizures and no vomiting     HPI Comments: Kelli Thompson is a 49 y.o. female who presents to the Emergency Department complaining of LOC just PTA. She states it was preceded by feeling her heart beating in her mouth (she has false teeth) and tingling in her lower extremities. She states she was getting ready to come the ED for these symptoms, which she has had before. She states she then stood up, felt SOB, diaphoretic, and felt like "everything slowed down" and had LOC, falling backwards. She denies any injury from falling. Husband states it seemed like she might be shaking, but is unsure because of the way she fell, and states she was roused immediately. She reports fatigue now. She notes nausea earlier this evening. She denies ever passing out before. She reports history of HTN for which she takes medications; she denies other medical problems. She denies DM, MI, CVA, or blood clots. She reports history of C-section cholecystectomy, hysterectomy, and appendectomy. She denies FHx of sudden death at young age. She denies fever, vomiting, diarrhea, chest pain, back pain,  lower abdominal pain, or dysuria.  Past Medical History  Diagnosis Date  . Hypertension   . Pancreatitis   . Anxiety   . Urinary frequency 01/30/2014  . Urinary incontinence, mixed 01/30/2014  . Peri-menopause 02/28/2014   Past Surgical History   Procedure Laterality Date  . Cesarean section      x2  . Cholecystectomy    . Appendectomy    . Abdominal hysterectomy     Family History  Problem Relation Age of Onset  . Hypertension Mother   . Diabetes Mother     borderline  . Hypertension Father   . Asthma Father   . Stroke Father   . Diabetes Maternal Grandmother   . Congestive Heart Failure Maternal Grandmother   . Arthritis Maternal Grandmother   . Parkinson's disease Maternal Grandfather   . Cancer Paternal Grandmother   . Alcohol abuse Paternal Grandfather   . Other Son     stomach issues   History  Substance Use Topics  . Smoking status: Former Smoker    Types: Cigarettes  . Smokeless tobacco: Never Used  . Alcohol Use: No   OB History    Gravida Para Term Preterm AB TAB SAB Ectopic Multiple Living   3 3        3      Review of Systems  Constitutional: Positive for diaphoresis and fatigue. Negative for fever.  Respiratory: Positive for shortness of breath.   Cardiovascular: Positive for palpitations and syncope. Negative for chest pain.  Gastrointestinal: Positive for nausea. Negative for vomiting, abdominal pain and diarrhea.  Genitourinary: Negative for dysuria.  Musculoskeletal: Negative for back pain.  Neurological: Positive for syncope. Negative for seizures and headaches.  All other systems reviewed and are negative.     Allergies  Review  of patient's allergies indicates no known allergies.  Home Medications   Prior to Admission medications   Medication Sig Start Date End Date Taking? Authorizing Provider  amLODipine (NORVASC) 10 MG tablet Take 10 mg by mouth daily.    Historical Provider, MD  losartan (COZAAR) 100 MG tablet Take 100 mg by mouth daily.    Historical Provider, MD  potassium chloride SA (K-DUR,KLOR-CON) 20 MEQ tablet Take 1 tablet (20 mEq total) by mouth daily. 03/02/14   Sharyon Cable, MD   BP 143/95 mmHg  Pulse 86  Temp(Src) 98.4 F (36.9 C)  Resp 17  Ht 5\' 5"  (1.651  m)  Wt 212 lb (96.163 kg)  BMI 35.28 kg/m2  SpO2 99% Physical Exam  Nursing note and vitals reviewed.  CONSTITUTIONAL: Well developed/well nourished HEAD: Normocephalic/atraumatic EYES: EOMI/PERRL ENMT: Mucous membranes moist NECK: supple no meningeal signs SPINE/BACK:entire spine nontender CV: S1/S2 noted, no murmurs/rubs/gallops noted LUNGS: Lungs are clear to auscultation bilaterally, no apparent distress ABDOMEN: soft, nontender, no rebound or guarding, bowel sounds noted throughout abdomen GU:no cva tenderness NEURO: Pt is awake/alert/appropriate, moves all extremitiesx4.  No facial droop.   EXTREMITIES: pulses normal/equal, full ROM SKIN: warm, color normal PSYCH: no abnormalities of mood noted, alert and oriented to situation  ED Course  Procedures   DIAGNOSTIC STUDIES: Oxygen Saturation is 99% on room air, normal by my interpretation.    COORDINATION OF CARE: 12:30 AM Discussed treatment plan with patient at beside, the patient agrees with the plan and has no further questions at this time.   Pt feels improved She is well appearing She is ambulatory without difficulty She has no complaints at this time I adviser her stop the Myrbetriq as this may be playing a role She also has recurrent hypoKalemia, will supplement EKG is unchanged She is very low risk for emergent cardiac dysrhythmia (lack of family history, EKG unchanged, no signs of WPW/Brugada) Also, very low suspicion for acute Pe (no tachycardia/hypoxia) Advised need for close cardiology evaluation which may include echo/holter monitor She agrees We discussed strict return precautions  Labs Review Labs Reviewed  I-STAT CHEM 8, ED - Abnormal; Notable for the following:    Potassium 2.9 (*)    Glucose, Bld 151 (*)    All other components within normal limits    EKG Interpretation   Date/Time:  Thursday March 02 2014 00:19:28 EST Ventricular Rate:  83 PR Interval:  169 QRS Duration: 94 QT  Interval:  402 QTC Calculation: 472 R Axis:   58 Text Interpretation:  Sinus rhythm Consider left atrial enlargement No  significant change since last tracing Confirmed by Christy Gentles  MD, Elenore Rota  [id number]) on 03/02/2014 12:48:22 AM       MDM   Final diagnoses:  Syncope, unspecified syncope type  Hypokalemia   Nursing notes including past medical history and social history reviewed and considered in documentation Labs/vital reviewed myself and considered during evaluation    I personally performed the services described in this documentation, which was scribed in my presence. The recorded information has been reviewed and is accurate.      Sharyon Cable, MD 03/02/14 [phone]

## 2014-03-02 NOTE — ED Notes (Signed)
MD at bedside. 

## 2014-03-02 NOTE — Discharge Instructions (Signed)

## 2014-03-04 ENCOUNTER — Emergency Department (HOSPITAL_COMMUNITY)
Admission: EM | Admit: 2014-03-04 | Discharge: 2014-03-04 | Disposition: A | Discharge status: Home / Self Care | Payer: BLUE CROSS/BLUE SHIELD | Attending: Emergency Medicine | Admitting: Emergency Medicine

## 2014-03-04 ENCOUNTER — Encounter (HOSPITAL_COMMUNITY): Payer: Self-pay | Admitting: Emergency Medicine

## 2014-03-04 DIAGNOSIS — R109 Unspecified abdominal pain: Secondary | ICD-10-CM | POA: Insufficient documentation

## 2014-03-04 DIAGNOSIS — R110 Nausea: Secondary | ICD-10-CM | POA: Insufficient documentation

## 2014-03-04 DIAGNOSIS — R002 Palpitations: Secondary | ICD-10-CM | POA: Diagnosis not present

## 2014-03-04 DIAGNOSIS — Z8742 Personal history of other diseases of the female genital tract: Secondary | ICD-10-CM | POA: Diagnosis not present

## 2014-03-04 DIAGNOSIS — Z8719 Personal history of other diseases of the digestive system: Secondary | ICD-10-CM | POA: Insufficient documentation

## 2014-03-04 DIAGNOSIS — Z87891 Personal history of nicotine dependence: Secondary | ICD-10-CM | POA: Insufficient documentation

## 2014-03-04 DIAGNOSIS — I10 Essential (primary) hypertension: Secondary | ICD-10-CM | POA: Diagnosis not present

## 2014-03-04 DIAGNOSIS — E876 Hypokalemia: Secondary | ICD-10-CM | POA: Diagnosis not present

## 2014-03-04 DIAGNOSIS — Z79899 Other long term (current) drug therapy: Secondary | ICD-10-CM | POA: Diagnosis not present

## 2014-03-04 DIAGNOSIS — R0602 Shortness of breath: Secondary | ICD-10-CM | POA: Diagnosis not present

## 2014-03-04 DIAGNOSIS — Z8659 Personal history of other mental and behavioral disorders: Secondary | ICD-10-CM | POA: Diagnosis not present

## 2014-03-04 LAB — CBC WITH DIFFERENTIAL/PLATELET
Basophils Absolute: 0 10*3/uL (ref 0.0–0.1)
Basophils Relative: 0 % (ref 0–1)
Eosinophils Absolute: 0.2 10*3/uL (ref 0.0–0.7)
Eosinophils Relative: 3 % (ref 0–5)
HCT: 38.9 % (ref 36.0–46.0)
HEMOGLOBIN: 13.2 g/dL (ref 12.0–15.0)
Lymphocytes Relative: 23 % (ref 12–46)
Lymphs Abs: 1.8 10*3/uL (ref 0.7–4.0)
MCH: 29.5 pg (ref 26.0–34.0)
MCHC: 33.9 g/dL (ref 30.0–36.0)
MCV: 86.8 fL (ref 78.0–100.0)
MONO ABS: 0.4 10*3/uL (ref 0.1–1.0)
Monocytes Relative: 5 % (ref 3–12)
Neutro Abs: 5.5 10*3/uL (ref 1.7–7.7)
Neutrophils Relative %: 69 % (ref 43–77)
Platelets: 223 10*3/uL (ref 150–400)
RBC: 4.48 MIL/uL (ref 3.87–5.11)
RDW: 14 % (ref 11.5–15.5)
WBC: 7.9 10*3/uL (ref 4.0–10.5)

## 2014-03-04 LAB — BASIC METABOLIC PANEL
Anion gap: 2 — ABNORMAL LOW (ref 5–15)
BUN: 14 mg/dL (ref 6–23)
CO2: 28 mmol/L (ref 19–32)
CREATININE: 0.93 mg/dL (ref 0.50–1.10)
Calcium: 8.7 mg/dL (ref 8.4–10.5)
Chloride: 109 mmol/L (ref 96–112)
GFR calc Af Amer: 83 mL/min — ABNORMAL LOW (ref >90)
GFR calc non Af Amer: 72 mL/min — ABNORMAL LOW (ref >90)
Glucose, Bld: 134 mg/dL — ABNORMAL HIGH (ref 70–99)
POTASSIUM: 3.2 mmol/L — AB (ref 3.5–5.1)
SODIUM: 139 mmol/L (ref 135–145)

## 2014-03-04 LAB — TROPONIN I: Troponin I: <0.03 ng/mL (ref <0.031)

## 2014-03-04 MED ORDER — POTASSIUM CHLORIDE CRYS ER 20 MEQ PO TBCR
40.0000 meq | EXTENDED_RELEASE_TABLET | Freq: Once | ORAL | Status: AC
  Administered 2014-03-04: 40 meq via ORAL
  Filled 2014-03-04: qty 2

## 2014-03-04 NOTE — ED Notes (Signed)
Patient reports palpitations earlier tonight. States she also had an episode of tingling down left arm. States she also had nausea and vomiting. States recently seen for similar episodes and told her potassium was low. States forgot to get prescription for potassium tablets filled.

## 2014-03-04 NOTE — ED Provider Notes (Signed)
CSN: [id number]     Arrival date & time 03/04/14  0007 History  This chart was scribed for Kelli Fuel, MD by Chester Holstein, ED Scribe. This patient was seen in room APA11/APA11 and the patient's care was started at 12:37 AM.    Chief Complaint  Patient presents with  . Palpitations    Patient is a 49 y.o. female presenting with palpitations. The history is provided by the patient and the spouse. No language interpreter was used.  Palpitations Associated symptoms: diaphoresis, nausea and shortness of breath   Associated symptoms: no vomiting    HPI Comments: Kelli Thompson is a 49 y.o. female with h/o HTN and anxiety who presents to the Emergency Department complaining of palpitations with onset this evening for 15 minutes. Pt notes she was laying down at onset. She states she had abdominal discomfort before she went to sleep.  Pt notes associated diaphoresis, SOB, and nausea.  Pt has been seen in past including 03/04/14 for similar symptoms. Pt notes change in hypertension medication at this time. Pt has been told her potassium is low, however she has not gotten her potassium tablets refilled. Husband notes symptoms often occur following food intake after 8 PM. Pt notes caffeine use, but denies smoking or EtOH use. Pt denies light-headedness, syncope, and vomiting. Pt has an appointment for a stress test in [place] next week.   Past Medical History  Diagnosis Date  . Hypertension   . Pancreatitis   . Anxiety   . Urinary frequency 01/30/2014  . Urinary incontinence, mixed 01/30/2014  . Peri-menopause 02/28/2014   Past Surgical History  Procedure Laterality Date  . Cesarean section      x2  . Cholecystectomy    . Appendectomy    . Abdominal hysterectomy     Family History  Problem Relation Age of Onset  . Hypertension Mother   . Diabetes Mother     borderline  . Hypertension Father   . Asthma Father   . Stroke Father   . Diabetes Maternal Grandmother   . Congestive Heart Failure  Maternal Grandmother   . Arthritis Maternal Grandmother   . Parkinson's disease Maternal Grandfather   . Cancer Paternal Grandmother   . Alcohol abuse Paternal Grandfather   . Other Son     stomach issues   History  Substance Use Topics  . Smoking status: Former Smoker    Types: Cigarettes  . Smokeless tobacco: Never Used  . Alcohol Use: No   OB History    Gravida Para Term Preterm AB TAB SAB Ectopic Multiple Living   3 3        3      Review of Systems  Constitutional: Positive for diaphoresis.  Respiratory: Positive for shortness of breath.   Cardiovascular: Positive for palpitations.  Gastrointestinal: Positive for nausea and abdominal pain (mild discomfort). Negative for vomiting.  Neurological: Negative for syncope and light-headedness.      Allergies  Review of patient's allergies indicates no known allergies.  Home Medications   Prior to Admission medications   Medication Sig Start Date End Date Taking? Authorizing Provider  amLODipine (NORVASC) 10 MG tablet Take 10 mg by mouth daily.    Historical Provider, MD  losartan (COZAAR) 100 MG tablet Take 100 mg by mouth daily.    Historical Provider, MD  potassium chloride SA (K-DUR,KLOR-CON) 20 MEQ tablet Take 1 tablet (20 mEq total) by mouth daily. 03/02/14   Sharyon Cable, MD   BP 135/96 mmHg  Pulse 83  Temp(Src) 98.2 F (36.8 C) (Oral)  Resp 20  Ht 5\' 5"  (1.651 m)  Wt 212 lb (96.163 kg)  BMI 35.28 kg/m2  SpO2 99% Physical Exam  Constitutional: She is oriented to person, place, and time. She appears well-developed and well-nourished. No distress.  HENT:  Head: Normocephalic and atraumatic.  Eyes: EOM are normal. Pupils are equal, round, and reactive to light.  Neck: Normal range of motion. Neck supple. No JVD present.  Cardiovascular: Normal rate, regular rhythm and normal heart sounds.   No murmur heard. Pulmonary/Chest: Effort normal and breath sounds normal. She has no wheezes. She has no rales. She  exhibits no tenderness.  Abdominal: Soft. Bowel sounds are normal. She exhibits no distension and no mass. There is no tenderness.  Musculoskeletal: Normal range of motion. She exhibits no edema.  Lymphadenopathy:    She has no cervical adenopathy.  Neurological: She is alert and oriented to person, place, and time. No cranial nerve deficit. Coordination normal.  Skin: Skin is warm and dry. No rash noted.  Psychiatric: She has a normal mood and affect. Her behavior is normal. Judgment and thought content normal.  Nursing note and vitals reviewed.   ED Course  Procedures (including critical care time) DIAGNOSTIC STUDIES: Oxygen Saturation is 99% on room air, normal by my interpretation.    COORDINATION OF CARE: 12:45 AM Discussed treatment plan with patient at beside, the patient agrees with the plan and has no further questions at this time.   Labs Review Results for orders placed or performed during the hospital encounter of 21/19/41  Basic metabolic panel  Result Value Ref Range   Sodium 139 135 - 145 mmol/L   Potassium 3.2 (L) 3.5 - 5.1 mmol/L   Chloride 109 96 - 112 mmol/L   CO2 28 19 - 32 mmol/L   Glucose, Bld 134 (H) 70 - 99 mg/dL   BUN 14 6 - 23 mg/dL   Creatinine, Ser 0.93 0.50 - 1.10 mg/dL   Calcium 8.7 8.4 - 10.5 mg/dL   GFR calc non Af Amer 72 (L) >90 mL/min   GFR calc Af Amer 83 (L) >90 mL/min   Anion gap 2 (L) 5 - 15  Troponin I  Result Value Ref Range   Troponin I <0.03 <0.031 ng/mL  CBC with Differential  Result Value Ref Range   WBC 7.9 4.0 - 10.5 K/uL   RBC 4.48 3.87 - 5.11 MIL/uL   Hemoglobin 13.2 12.0 - 15.0 g/dL   HCT 38.9 36.0 - 46.0 %   MCV 86.8 78.0 - 100.0 fL   MCH 29.5 26.0 - 34.0 pg   MCHC 33.9 30.0 - 36.0 g/dL   RDW 14.0 11.5 - 15.5 %   Platelets 223 150 - 400 K/uL   Neutrophils Relative % 69 43 - 77 %   Neutro Abs 5.5 1.7 - 7.7 K/uL   Lymphocytes Relative 23 12 - 46 %   Lymphs Abs 1.8 0.7 - 4.0 K/uL   Monocytes Relative 5 3 - 12 %    Monocytes Absolute 0.4 0.1 - 1.0 K/uL   Eosinophils Relative 3 0 - 5 %   Eosinophils Absolute 0.2 0.0 - 0.7 K/uL   Basophils Relative 0 0 - 1 %   Basophils Absolute 0.0 0.0 - 0.1 K/uL     EKG Interpretation   Date/Time:  Saturday March 04 2014 00:28:18 EST Ventricular Rate:  86 PR Interval:  164 QRS Duration: 99 QT Interval:  402  QTC Calculation: 481 R Axis:   60 Text Interpretation:  Sinus rhythm Left atrial enlargement Baseline wander  in lead(s) I III aVL When compared with ECG of 03/02/2014, No significant  change was found Confirmed by [facility]  MD, Janasia Coverdale ([id number]) on 03/04/2014  12:34:25 AM      MDM   Final diagnoses:  Palpitations  Hypokalemia    Episode of palpitations in pattern that is suggestive of paroxysmal supraventricular tachycardia. Old records are reviewed and she was in the ED 2 days ago with a similar episode which was associated with syncope. Also had a visit in December with similar presentation. She was hypokalemic 2 days ago but apparently has not had her potassium prescription filled. Lateral lites are rechecked and she is still hypokalemic. She is given a dose of potassium in the ED. She is scheduled for a stress test next week. However, I've explained her that I feel either a Holter monitor or event monitor are what she needs to diagnose her condition. She is instructed to have her potassium prescription filled in the morning and to take it as directed.  I personally performed the services described in this documentation, which was scribed in my presence. The recorded information has been reviewed and is accurate.      Kelli Fuel, MD 94/07/68 [id number]

## 2014-03-04 NOTE — Discharge Instructions (Signed)
Make sure to fill your prescription for Palpitations A palpitation is the feeling that your heartbeat is irregular or is faster than normal. It may feel like your heart is fluttering or skipping a beat. Palpitations are usually not a serious problem. However, in some cases, you may need further medical evaluation. CAUSES  Palpitations can be caused by:  Smoking.  Caffeine or other stimulants, such as diet pills or energy drinks.  Alcohol.  Stress and anxiety.  Strenuous physical activity.  Fatigue.  Certain medicines.  Heart disease, especially if you have a history of irregular heart rhythms (arrhythmias), such as atrial fibrillation, atrial flutter, or supraventricular tachycardia.  An improperly working pacemaker or defibrillator. DIAGNOSIS  To find the cause of your palpitations, your health care provider will take your medical history and perform a physical exam. Your health care provider may also have you take a test called an ambulatory electrocardiogram (ECG). An ECG records your heartbeat patterns over a 24-hour period. You may also have other tests, such as:  Transthoracic echocardiogram (TTE). During echocardiography, sound waves are used to evaluate how blood flows through your heart.  Transesophageal echocardiogram (TEE).  Cardiac monitoring. This allows your health care provider to monitor your heart rate and rhythm in real time.  Holter monitor. This is a portable device that records your heartbeat and can help diagnose heart arrhythmias. It allows your health care provider to track your heart activity for several days, if needed.  Stress tests by exercise or by giving medicine that makes the heart beat faster. TREATMENT  Treatment of palpitations depends on the cause of your symptoms and can vary greatly. Most cases of palpitations do not require any treatment other than time, relaxation, and monitoring your symptoms. Other causes, such as atrial fibrillation,  atrial flutter, or supraventricular tachycardia, usually require further treatment. HOME CARE INSTRUCTIONS   Avoid:  Caffeinated coffee, tea, soft drinks, diet pills, and energy drinks.  Chocolate.  Alcohol.  Stop smoking if you smoke.  Reduce your stress and anxiety. Things that can help you relax include:  A method of controlling things in your body, such as your heartbeats, with your mind (biofeedback).  Yoga.  Meditation.  Physical activity such as swimming, jogging, or walking.  Get plenty of rest and sleep. SEEK MEDICAL CARE IF:   You continue to have a fast or irregular heartbeat beyond 24 hours.  Your palpitations occur more often. SEEK IMMEDIATE MEDICAL CARE IF:  You have chest pain or shortness of breath.  You have a severe headache.  You feel dizzy or you faint. MAKE SURE YOU:  Understand these instructions.  Will watch your condition.  Will get help right away if you are not doing well or get worse. Document Released: 01/11/2000 Document Revised: 01/18/2013 Document Reviewed: 03/14/2011 [facility] Patient Information 2015 [place], [place]. This information is not intended to replace advice given to you by your health care provider. Make sure you discuss any questions you have with your health care provider. assium and to take it as prescribed. Thought with your PCP and/or cardiologist about arranging for either a Holter monitor or an event monitor to try to determine what the cause of your palpitations is.  Hypokalemia Hypokalemia means that the amount of potassium in the blood is lower than normal.Potassium is a chemical, called an electrolyte, that helps regulate the amount of fluid in the body. It also stimulates muscle contraction and helps nerves function properly.Most of the body's potassium is inside of cells, and only  a very small amount is in the blood. Because the amount in the blood is so small, minor changes can be  life-threatening. CAUSES  Antibiotics.  Diarrhea or vomiting.  Using laxatives too much, which can cause diarrhea.  Chronic kidney disease.  Water pills (diuretics).  Eating disorders (bulimia).  Low magnesium level.  Sweating a lot. SIGNS AND SYMPTOMS  Weakness.  Constipation.  Fatigue.  Muscle cramps.  Mental confusion.  Skipped heartbeats or irregular heartbeat (palpitations).  Tingling or numbness. DIAGNOSIS  Your health care provider can diagnose hypokalemia with blood tests. In addition to checking your potassium level, your health care provider may also check other lab tests. TREATMENT Hypokalemia can be treated with potassium supplements taken by mouth or adjustments in your current medicines. If your potassium level is very low, you may need to get potassium through a vein (IV) and be monitored in the hospital. A diet high in potassium is also helpful. Foods high in potassium are:  Nuts, such as peanuts and pistachios.  Seeds, such as sunflower seeds and pumpkin seeds.  Peas, lentils, and lima beans.  Whole grain and bran cereals and breads.  Fresh fruit and vegetables, such as apricots, avocado, bananas, cantaloupe, kiwi, oranges, tomatoes, asparagus, and potatoes.  Orange and tomato juices.  Red meats.  Fruit yogurt. HOME CARE INSTRUCTIONS  Take all medicines as prescribed by your health care provider.  Maintain a healthy diet by including nutritious food, such as fruits, vegetables, nuts, whole grains, and lean meats.  If you are taking a laxative, be sure to follow the directions on the label. SEEK MEDICAL CARE IF:  Your weakness gets worse.  You feel your heart pounding or racing.  You are vomiting or having diarrhea.  You are diabetic and having trouble keeping your blood glucose in the normal range. SEEK IMMEDIATE MEDICAL CARE IF:  You have chest pain, shortness of breath, or dizziness.  You are vomiting or having diarrhea for  more than 2 days.  You faint. MAKE SURE YOU:   Understand these instructions.  Will watch your condition.  Will get help right away if you are not doing well or get worse. Document Released: 01/13/2005 Document Revised: 11/03/2012 Document Reviewed: 07/16/2012 [facility] Patient Information 2015 [place], [place]. This information is not intended to replace advice given to you by your health care provider. Make sure you discuss any questions you have with your health care provider.

## 2014-04-22 ENCOUNTER — Encounter (HOSPITAL_COMMUNITY): Payer: Self-pay | Admitting: *Deleted

## 2014-04-22 ENCOUNTER — Emergency Department (HOSPITAL_COMMUNITY)
Admission: EM | Admit: 2014-04-22 | Discharge: 2014-04-23 | Disposition: A | Discharge status: Home / Self Care | Payer: BLUE CROSS/BLUE SHIELD | Attending: Emergency Medicine | Admitting: Emergency Medicine

## 2014-04-22 DIAGNOSIS — R110 Nausea: Secondary | ICD-10-CM | POA: Insufficient documentation

## 2014-04-22 DIAGNOSIS — R000 Tachycardia, unspecified: Secondary | ICD-10-CM | POA: Insufficient documentation

## 2014-04-22 DIAGNOSIS — R61 Generalized hyperhidrosis: Secondary | ICD-10-CM | POA: Diagnosis not present

## 2014-04-22 DIAGNOSIS — Z8719 Personal history of other diseases of the digestive system: Secondary | ICD-10-CM | POA: Diagnosis not present

## 2014-04-22 DIAGNOSIS — F419 Anxiety disorder, unspecified: Secondary | ICD-10-CM | POA: Insufficient documentation

## 2014-04-22 DIAGNOSIS — Z87891 Personal history of nicotine dependence: Secondary | ICD-10-CM | POA: Diagnosis not present

## 2014-04-22 DIAGNOSIS — Z9889 Other specified postprocedural states: Secondary | ICD-10-CM | POA: Insufficient documentation

## 2014-04-22 DIAGNOSIS — I10 Essential (primary) hypertension: Secondary | ICD-10-CM | POA: Insufficient documentation

## 2014-04-22 DIAGNOSIS — Z8742 Personal history of other diseases of the female genital tract: Secondary | ICD-10-CM | POA: Insufficient documentation

## 2014-04-22 DIAGNOSIS — R55 Syncope and collapse: Secondary | ICD-10-CM | POA: Diagnosis present

## 2014-04-22 DIAGNOSIS — Z79899 Other long term (current) drug therapy: Secondary | ICD-10-CM | POA: Diagnosis not present

## 2014-04-22 DIAGNOSIS — R002 Palpitations: Secondary | ICD-10-CM | POA: Insufficient documentation

## 2014-04-22 NOTE — ED Notes (Addendum)
Pt states she had an episode of heart palpitations and passed out. Pt states she felt diaphoretic and tingly in her arms. Unknown loc. Pt does c/o head and neck pain. This is the 3rd episode of palpitations and the 2nd time she passed out.

## 2014-04-22 NOTE — ED Provider Notes (Signed)
CSN: [id number]     Arrival date & time 04/22/14  2201 History  This chart was scribed for [name], MD by Jeanell Sparrow, ED Scribe. This patient was seen in room APA09/APA09 and the patient's care was started at 11:07 PM.    Chief Complaint  Patient presents with  . Near Syncope   The history is provided by the patient. No language interpreter was used.   HPI Comments: Kelli Thompson is a 49 y.o. female who presents to the Emergency Department complaining of an episode of near-syncope that occurred today. She states that she had some nausea, heart palpitations, diaphoresis, and a tingling sensation in her BUE before she passed out. She reports that she was out for a few seconds. She states that she had some bowel incontinence with her episode of near-syncope. She reports that her ankles have been swelling. She reports that she has a prior hx of near-syncope and palpitations. She denies any chest pain, SOB, or vomiting.   Past Medical History  Diagnosis Date  . Hypertension   . Pancreatitis   . Anxiety   . Urinary frequency 01/30/2014  . Urinary incontinence, mixed 01/30/2014  . Peri-menopause 02/28/2014   Past Surgical History  Procedure Laterality Date  . Cesarean section      x2  . Cholecystectomy    . Appendectomy    . Abdominal hysterectomy     Family History  Problem Relation Age of Onset  . Hypertension Mother   . Diabetes Mother     borderline  . Hypertension Father   . Asthma Father   . Stroke Father   . Diabetes Maternal Grandmother   . Congestive Heart Failure Maternal Grandmother   . Arthritis Maternal Grandmother   . Parkinson's disease Maternal Grandfather   . Cancer Paternal Grandmother   . Alcohol abuse Paternal Grandfather   . Other Son     stomach issues   History  Substance Use Topics  . Smoking status: Former Smoker    Types: Cigarettes  . Smokeless tobacco: Never Used  . Alcohol Use: No   OB History    Gravida Para Term Preterm AB TAB SAB  Ectopic Multiple Living   3 3        3      Review of Systems  Constitutional: Positive for diaphoresis.  Respiratory: Negative for shortness of breath.   Cardiovascular: Positive for palpitations and near-syncope. Negative for chest pain.  Gastrointestinal: Positive for nausea. Negative for vomiting.  Musculoskeletal: Positive for joint swelling.  Neurological: Positive for syncope.  All other systems reviewed and are negative.   Allergies  Review of patient's allergies indicates no known allergies.  Home Medications   Prior to Admission medications   Medication Sig Start Date End Date Taking? Authorizing Provider  amLODipine (NORVASC) 10 MG tablet Take 10 mg by mouth daily.    Historical Provider, MD  losartan (COZAAR) 100 MG tablet Take 100 mg by mouth daily.    Historical Provider, MD  potassium chloride SA (K-DUR,KLOR-CON) 20 MEQ tablet Take 1 tablet (20 mEq total) by mouth daily. 03/02/14   Ripley Fraise, MD   BP 141/101 mmHg  Pulse 110  Temp(Src) 98.8 F (37.1 C) (Oral)  Resp 18  Ht 5\' 5"  (1.651 m)  Wt 218 lb (98.884 kg)  BMI 36.28 kg/m2  SpO2 100% Physical Exam  Constitutional: She is oriented to person, place, and time. She appears well-developed and well-nourished. No distress.  HENT:  Head: Normocephalic and atraumatic.  Eyes: EOM are normal. Pupils are equal, round, and reactive to light.  Neck: Normal range of motion. Neck supple. No JVD present.  Cardiovascular: Regular rhythm.  Exam reveals no gallop and no friction rub.   No murmur heard. Tachycardic. No carotid bruits. +1 pedal edema.   Pulmonary/Chest: Effort normal and breath sounds normal. She has no wheezes. She has no rales. She exhibits no tenderness.  Abdominal: Soft. Bowel sounds are normal. She exhibits no distension and no mass. There is no tenderness.  Musculoskeletal: Normal range of motion. She exhibits no edema.  Lymphadenopathy:    She has no cervical adenopathy.  Neurological: She is  alert and oriented to person, place, and time. No cranial nerve deficit. She exhibits normal muscle tone. Coordination normal.  Skin: Skin is warm and dry. No rash noted.  Psychiatric: She has a normal mood and affect. Her behavior is normal. Judgment and thought content normal.  Nursing note and vitals reviewed.   ED Course  Procedures (including critical care time) DIAGNOSTIC STUDIES: Oxygen Saturation is 100% on RA, normal by my interpretation.    COORDINATION OF CARE: 11:11 PM- Pt advised of plan for treatment which includes labs and pt agrees.  Labs Review Results for orders placed or performed during the hospital encounter of 04/22/14  CBC with Differential  Result Value Ref Range   WBC 12.4 (H) 4.0 - 10.5 K/uL   RBC 4.53 3.87 - 5.11 MIL/uL   Hemoglobin 13.3 12.0 - 15.0 g/dL   HCT 39.5 36.0 - 46.0 %   MCV 87.2 78.0 - 100.0 fL   MCH 29.4 26.0 - 34.0 pg   MCHC 33.7 30.0 - 36.0 g/dL   RDW 13.4 11.5 - 15.5 %   Platelets 211 150 - 400 K/uL   Neutrophils Relative % 79 (H) 43 - 77 %   Neutro Abs 9.8 (H) 1.7 - 7.7 K/uL   Lymphocytes Relative 14 12 - 46 %   Lymphs Abs 1.7 0.7 - 4.0 K/uL   Monocytes Relative 6 3 - 12 %   Monocytes Absolute 0.8 0.1 - 1.0 K/uL   Eosinophils Relative 1 0 - 5 %   Eosinophils Absolute 0.2 0.0 - 0.7 K/uL   Basophils Relative 0 0 - 1 %   Basophils Absolute 0.0 0.0 - 0.1 K/uL  Basic metabolic panel  Result Value Ref Range   Sodium 142 135 - 145 mmol/L   Potassium 3.1 (L) 3.5 - 5.1 mmol/L   Chloride 111 96 - 112 mmol/L   CO2 21 19 - 32 mmol/L   Glucose, Bld 151 (H) 70 - 99 mg/dL   BUN 23 6 - 23 mg/dL   Creatinine, Ser 1.03 0.50 - 1.10 mg/dL   Calcium 8.7 8.4 - 10.5 mg/dL   GFR calc non Af Amer 63 (L) >90 mL/min   GFR calc Af Amer 73 (L) >90 mL/min   Anion gap 10 5 - 15  Troponin I  Result Value Ref Range   Troponin I <0.03 <0.031 ng/mL  CK  Result Value Ref Range   Total CK 76 7 - 177 U/L  Lactic acid, plasma  Result Value Ref Range    Lactic Acid, Venous 1.3 0.5 - 2.0 mmol/L    EKG Interpretation   Date/Time:  Saturday April 22 2014 22:37:30 EDT Ventricular Rate:  112 PR Interval:  175 QRS Duration: 90 QT Interval:  322 QTC Calculation: 439 R Axis:   64 Text Interpretation:  Sinus tachycardia Otherwise within normal  limits  When compared with ECG of 03/04/2014, No significant change was found  Confirmed by [facility]  MD, Angelie Kram ([id number]) on 04/22/2014 11:06:42 PM      MDM   Final diagnoses:  Syncope, unspecified syncope type  Palpitations    Syncope with palpitations. Fecal incontinence is worrisome for possible seizure, but rest of episode does not sound like seizure. Old records reviewed and she had been evaluated for syncope last month and a separate ED visit for palpitations. Syncope does appear to be related to arrhythmia both today and previous ED visit. Screening labs will be obtained.  Laboratory workup is unremarkable including normal CK and normal lactic acid. Therefore, I doubt she had a seizure. She is discharged with instructions to follow-up with PCP. She may need to repeat her event monitor to find out what her rhythm disturbances during episodes of syncope.  I personally performed the services described in this documentation, which was scribed in my presence. The recorded information has been reviewed and is accurate.      [name], MD [id number]

## 2014-04-23 LAB — BASIC METABOLIC PANEL
Anion gap: 10 (ref 5–15)
BUN: 23 mg/dL (ref 6–23)
CHLORIDE: 111 mmol/L (ref 96–112)
CO2: 21 mmol/L (ref 19–32)
Calcium: 8.7 mg/dL (ref 8.4–10.5)
Creatinine, Ser: 1.03 mg/dL (ref 0.50–1.10)
GFR calc non Af Amer: 63 mL/min — ABNORMAL LOW (ref >90)
GFR, EST AFRICAN AMERICAN: 73 mL/min — AB (ref >90)
GLUCOSE: 151 mg/dL — AB (ref 70–99)
Potassium: 3.1 mmol/L — ABNORMAL LOW (ref 3.5–5.1)
SODIUM: 142 mmol/L (ref 135–145)

## 2014-04-23 LAB — CBC WITH DIFFERENTIAL/PLATELET
Basophils Absolute: 0 10*3/uL (ref 0.0–0.1)
Basophils Relative: 0 % (ref 0–1)
EOS PCT: 1 % (ref 0–5)
Eosinophils Absolute: 0.2 10*3/uL (ref 0.0–0.7)
HEMATOCRIT: 39.5 % (ref 36.0–46.0)
Hemoglobin: 13.3 g/dL (ref 12.0–15.0)
LYMPHS PCT: 14 % (ref 12–46)
Lymphs Abs: 1.7 10*3/uL (ref 0.7–4.0)
MCH: 29.4 pg (ref 26.0–34.0)
MCHC: 33.7 g/dL (ref 30.0–36.0)
MCV: 87.2 fL (ref 78.0–100.0)
MONOS PCT: 6 % (ref 3–12)
Monocytes Absolute: 0.8 10*3/uL (ref 0.1–1.0)
NEUTROS PCT: 79 % — AB (ref 43–77)
Neutro Abs: 9.8 10*3/uL — ABNORMAL HIGH (ref 1.7–7.7)
PLATELETS: 211 10*3/uL (ref 150–400)
RBC: 4.53 MIL/uL (ref 3.87–5.11)
RDW: 13.4 % (ref 11.5–15.5)
WBC: 12.4 10*3/uL — ABNORMAL HIGH (ref 4.0–10.5)

## 2014-04-23 LAB — TROPONIN I: Troponin I: <0.03 ng/mL (ref <0.031)

## 2014-04-23 LAB — CK: CK TOTAL: 76 U/L (ref 7–177)

## 2014-04-23 LAB — LACTIC ACID, PLASMA: Lactic Acid, Venous: 1.3 mmol/L (ref 0.5–2.0)

## 2014-04-23 NOTE — ED Notes (Signed)
Pt laid back in the bed sleeping/snoring.

## 2014-04-23 NOTE — Discharge Instructions (Signed)
Talk with your cardiologist about whether you should have the event monitor repeated.  Syncope Syncope is a medical term for fainting or passing out. This means you lose consciousness and drop to the ground. People are generally unconscious for less than 5 minutes. You may have some muscle twitches for up to 15 seconds before waking up and returning to normal. Syncope occurs more often in older adults, but it can happen to anyone. While most causes of syncope are not dangerous, syncope can be a sign of a serious medical problem. It is important to seek medical care.  CAUSES  Syncope is caused by a sudden drop in blood flow to the brain. The specific cause is often not determined. Factors that can bring on syncope include:  Taking medicines that lower blood pressure.  Sudden changes in posture, such as standing up quickly.  Taking more medicine than prescribed.  Standing in one place for too long.  Seizure disorders.  Dehydration and excessive exposure to heat.  Low blood sugar (hypoglycemia).  Straining to have a bowel movement.  Heart disease, irregular heartbeat, or other circulatory problems.  Fear, emotional distress, seeing blood, or severe pain. SYMPTOMS  Right before fainting, you may:  Feel dizzy or light-headed.  Feel nauseous.  See all white or all black in your field of vision.  Have cold, clammy skin. DIAGNOSIS  Your health care provider will ask about your symptoms, perform a physical exam, and perform an electrocardiogram (ECG) to record the electrical activity of your heart. Your health care provider may also perform other heart or blood tests to determine the cause of your syncope which may include:  Transthoracic echocardiogram (TTE). During echocardiography, sound waves are used to evaluate how blood flows through your heart.  Transesophageal echocardiogram (TEE).  Cardiac monitoring. This allows your health care provider to monitor your heart rate and  rhythm in real time.  Holter monitor. This is a portable device that records your heartbeat and can help diagnose heart arrhythmias. It allows your health care provider to track your heart activity for several days, if needed.  Stress tests by exercise or by giving medicine that makes the heart beat faster. TREATMENT  In most cases, no treatment is needed. Depending on the cause of your syncope, your health care provider may recommend changing or stopping some of your medicines. HOME CARE INSTRUCTIONS  Have someone stay with you until you feel stable.  Do not drive, use machinery, or play sports until your health care provider says it is okay.  Keep all follow-up appointments as directed by your health care provider.  Lie down right away if you start feeling like you might faint. Breathe deeply and steadily. Wait until all the symptoms have passed.  Drink enough fluids to keep your urine clear or pale yellow.  If you are taking blood pressure or heart medicine, get up slowly and take several minutes to sit and then stand. This can reduce dizziness. SEEK IMMEDIATE MEDICAL CARE IF:   You have a severe headache.  You have unusual pain in the chest, abdomen, or back.  You are bleeding from your mouth or rectum, or you have black or tarry stool.  You have an irregular or very fast heartbeat.  You have pain with breathing.  You have repeated fainting or seizure-like jerking during an episode.  You faint when sitting or lying down.  You have confusion.  You have trouble walking.  You have severe weakness.  You have vision problems.  If you fainted, call your local emergency services (911 in [place] Do not drive yourself to the hospital.  MAKE SURE YOU:  Understand these instructions.  Will watch your condition.  Will get help right away if you are not doing well or get worse. Document Released: 01/13/2005 Document Revised: 01/18/2013 Document Reviewed: 03/14/2011 [facility]  Patient Information 2015 [place], [place]. This information is not intended to replace advice given to you by your health care provider. Make sure you discuss any questions you have with your health care provider.  Palpitations A palpitation is the feeling that your heartbeat is irregular or is faster than normal. It may feel like your heart is fluttering or skipping a beat. Palpitations are usually not a serious problem. However, in some cases, you may need further medical evaluation. CAUSES  Palpitations can be caused by:  Smoking.  Caffeine or other stimulants, such as diet pills or energy drinks.  Alcohol.  Stress and anxiety.  Strenuous physical activity.  Fatigue.  Certain medicines.  Heart disease, especially if you have a history of irregular heart rhythms (arrhythmias), such as atrial fibrillation, atrial flutter, or supraventricular tachycardia.  An improperly working pacemaker or defibrillator. DIAGNOSIS  To find the cause of your palpitations, your health care provider will take your medical history and perform a physical exam. Your health care provider may also have you take a test called an ambulatory electrocardiogram (ECG). An ECG records your heartbeat patterns over a 24-hour period. You may also have other tests, such as:  Transthoracic echocardiogram (TTE). During echocardiography, sound waves are used to evaluate how blood flows through your heart.  Transesophageal echocardiogram (TEE).  Cardiac monitoring. This allows your health care provider to monitor your heart rate and rhythm in real time.  Holter monitor. This is a portable device that records your heartbeat and can help diagnose heart arrhythmias. It allows your health care provider to track your heart activity for several days, if needed.  Stress tests by exercise or by giving medicine that makes the heart beat faster. TREATMENT  Treatment of palpitations depends on the cause of your symptoms and can  vary greatly. Most cases of palpitations do not require any treatment other than time, relaxation, and monitoring your symptoms. Other causes, such as atrial fibrillation, atrial flutter, or supraventricular tachycardia, usually require further treatment. HOME CARE INSTRUCTIONS   Avoid:  Caffeinated coffee, tea, soft drinks, diet pills, and energy drinks.  Chocolate.  Alcohol.  Stop smoking if you smoke.  Reduce your stress and anxiety. Things that can help you relax include:  A method of controlling things in your body, such as your heartbeats, with your mind (biofeedback).  Yoga.  Meditation.  Physical activity such as swimming, jogging, or walking.  Get plenty of rest and sleep. SEEK MEDICAL CARE IF:   You continue to have a fast or irregular heartbeat beyond 24 hours.  Your palpitations occur more often. SEEK IMMEDIATE MEDICAL CARE IF:  You have chest pain or shortness of breath.  You have a severe headache.  You feel dizzy or you faint. MAKE SURE YOU:  Understand these instructions.  Will watch your condition.  Will get help right away if you are not doing well or get worse. Document Released: 01/11/2000 Document Revised: 01/18/2013 Document Reviewed: 03/14/2011 [facility] Patient Information 2015 [place], [place]. This information is not intended to replace advice given to you by your health care provider. Make sure you discuss any questions you have with your health care provider.

## 2014-04-23 NOTE — ED Notes (Signed)
Patient asking about the status of her husband who is also being seen in the Department. Let patient know that I can disclose his information without his consent.

## 2014-08-06 ENCOUNTER — Emergency Department (HOSPITAL_COMMUNITY): Payer: BLUE CROSS/BLUE SHIELD

## 2014-08-06 ENCOUNTER — Encounter (HOSPITAL_COMMUNITY): Payer: Self-pay | Admitting: Emergency Medicine

## 2014-08-06 ENCOUNTER — Emergency Department (HOSPITAL_COMMUNITY)
Admission: EM | Admit: 2014-08-06 | Discharge: 2014-08-06 | Disposition: A | Discharge status: Home / Self Care | Payer: BLUE CROSS/BLUE SHIELD | Attending: Emergency Medicine | Admitting: Emergency Medicine

## 2014-08-06 DIAGNOSIS — S97112A Crushing injury of left great toe, initial encounter: Secondary | ICD-10-CM | POA: Diagnosis present

## 2014-08-06 DIAGNOSIS — Z87448 Personal history of other diseases of urinary system: Secondary | ICD-10-CM | POA: Diagnosis not present

## 2014-08-06 DIAGNOSIS — Z87891 Personal history of nicotine dependence: Secondary | ICD-10-CM | POA: Diagnosis not present

## 2014-08-06 DIAGNOSIS — Y9389 Activity, other specified: Secondary | ICD-10-CM | POA: Diagnosis not present

## 2014-08-06 DIAGNOSIS — Y998 Other external cause status: Secondary | ICD-10-CM | POA: Diagnosis not present

## 2014-08-06 DIAGNOSIS — I10 Essential (primary) hypertension: Secondary | ICD-10-CM | POA: Insufficient documentation

## 2014-08-06 DIAGNOSIS — Z8659 Personal history of other mental and behavioral disorders: Secondary | ICD-10-CM | POA: Insufficient documentation

## 2014-08-06 DIAGNOSIS — Z8742 Personal history of other diseases of the female genital tract: Secondary | ICD-10-CM | POA: Diagnosis not present

## 2014-08-06 DIAGNOSIS — S91119A Laceration without foreign body of unspecified toe without damage to nail, initial encounter: Secondary | ICD-10-CM

## 2014-08-06 DIAGNOSIS — Z8719 Personal history of other diseases of the digestive system: Secondary | ICD-10-CM | POA: Insufficient documentation

## 2014-08-06 DIAGNOSIS — Y9289 Other specified places as the place of occurrence of the external cause: Secondary | ICD-10-CM | POA: Diagnosis not present

## 2014-08-06 DIAGNOSIS — W208XXA Other cause of strike by thrown, projected or falling object, initial encounter: Secondary | ICD-10-CM | POA: Insufficient documentation

## 2014-08-06 DIAGNOSIS — Z23 Encounter for immunization: Secondary | ICD-10-CM | POA: Insufficient documentation

## 2014-08-06 DIAGNOSIS — S91112A Laceration without foreign body of left great toe without damage to nail, initial encounter: Secondary | ICD-10-CM | POA: Diagnosis not present

## 2014-08-06 MED ORDER — TETANUS-DIPHTH-ACELL PERTUSSIS 5-2.5-18.5 LF-MCG/0.5 IM SUSP
0.5000 mL | Freq: Once | INTRAMUSCULAR | Status: AC
  Administered 2014-08-06: 0.5 mL via INTRAMUSCULAR
  Filled 2014-08-06: qty 0.5

## 2014-08-06 MED ORDER — TRAMADOL HCL 50 MG PO TABS: 50.0000 mg | ORAL_TABLET | Freq: Four times a day (QID) | ORAL | Status: DC | PRN

## 2014-08-06 MED ORDER — BACITRACIN ZINC 500 UNIT/GM EX OINT
TOPICAL_OINTMENT | CUTANEOUS | Status: AC
  Administered 2014-08-06: 1 via TOPICAL
  Filled 2014-08-06: qty 0.9

## 2014-08-06 MED ORDER — HYDROCODONE-ACETAMINOPHEN 5-325 MG PO TABS
1.0000 | ORAL_TABLET | Freq: Once | ORAL | Status: AC
  Administered 2014-08-06: 1 via ORAL
  Filled 2014-08-06: qty 1

## 2014-08-06 MED ORDER — LIDOCAINE HCL (PF) 2 % IJ SOLN
10.0000 mL | Freq: Once | INTRAMUSCULAR | Status: AC
  Administered 2014-08-06: 10 mL via INTRADERMAL
  Filled 2014-08-06: qty 10

## 2014-08-06 MED ORDER — BACITRACIN 500 UNIT/GM EX OINT
1.0000 "application " | TOPICAL_OINTMENT | Freq: Two times a day (BID) | CUTANEOUS | Status: DC
  Administered 2014-08-06: 1 via TOPICAL
  Filled 2014-08-06 (×4): qty 0.9

## 2014-08-06 MED ORDER — CEPHALEXIN 500 MG PO CAPS: 500.0000 mg | ORAL_CAPSULE | Freq: Four times a day (QID) | ORAL | Status: DC

## 2014-08-06 NOTE — ED Notes (Signed)
Patient c/o left great to pain after injury. Per patient wooden L shaped post landed on toe. Patient reports large amount of bleeding. Patient's toe wrapped with washcloth. No active bleeding noted at this toe.

## 2014-08-06 NOTE — Discharge Instructions (Signed)

## 2014-08-06 NOTE — ED Provider Notes (Signed)
CSN: [id number]     Arrival date & time 08/06/14  1644 History   This chart was scribed for Kem Parkinson, PA-C working with Ezequiel Essex, MD by Mercy Moore, ED Scribe. This patient was seen in room Room/bed info not found and the patient's care was started at 5:31 PM.   Chief Complaint  Patient presents with  . Toe Pain   The history is provided by the patient. No language interpreter was used.   HPI Comments: Kelli Thompson is a 49 y.o. female who presents to the Emergency Department with crush injury to left great toe incurred today, just one hour ago. Patient reports dropping metal post on her toe when wearing open toed shoes. Patient reports profuse bleeding immediately following the incident. Patient's husband applied a washcloth to her wound in an attempt to stop/controll the bleeding. Patient presents with linear laceration just proximal to her nail bed. Patient locates throbbing pain to this site only, denying involvement of her remaining foot, ankle or knee. Patient endorses maintained movement and sensation to affected toe. She denies blood thinners, numbness of the foot or toes, and swelling.  Pain worse with weight bearing.  Patient denies additional injuries.   Past Medical History  Diagnosis Date  . Hypertension   . Pancreatitis   . Anxiety   . Urinary frequency 01/30/2014  . Urinary incontinence, mixed 01/30/2014  . Peri-menopause 02/28/2014   Past Surgical History  Procedure Laterality Date  . Cesarean section      x2  . Cholecystectomy    . Appendectomy    . Abdominal hysterectomy     Family History  Problem Relation Age of Onset  . Hypertension Mother   . Diabetes Mother     borderline  . Hypertension Father   . Asthma Father   . Stroke Father   . Diabetes Maternal Grandmother   . Congestive Heart Failure Maternal Grandmother   . Arthritis Maternal Grandmother   . Parkinson's disease Maternal Grandfather   . Cancer Paternal Grandmother   . Alcohol abuse  Paternal Grandfather   . Other Son     stomach issues   History  Substance Use Topics  . Smoking status: Former Smoker -- 0.50 packs/day for 30 years    Types: Cigarettes    Quit date: 03/27/2013  . Smokeless tobacco: Never Used  . Alcohol Use: No   OB History    Gravida Para Term Preterm AB TAB SAB Ectopic Multiple Living   3 3 3       3      Review of Systems  Constitutional: Negative for fever and chills.  Genitourinary: Negative for dysuria and difficulty urinating.  Musculoskeletal: Positive for arthralgias. Negative for joint swelling.  Skin: Positive for wound. Negative for color change.  Neurological: Negative for weakness and numbness.  All other systems reviewed and are negative.   Allergies  Review of patient's allergies indicates no known allergies.  Home Medications   Prior to Admission medications   Medication Sig Start Date End Date Taking? Authorizing Provider  amLODipine (NORVASC) 10 MG tablet Take 10 mg by mouth daily.    Historical Provider, MD  losartan (COZAAR) 100 MG tablet Take 100 mg by mouth daily.    Historical Provider, MD  potassium chloride SA (K-DUR,KLOR-CON) 20 MEQ tablet Take 1 tablet (20 mEq total) by mouth daily. 03/02/14   Ripley Fraise, MD   Triage Vitals: BP 155/95 mmHg  Pulse 84  Temp(Src) 99 F (37.2 C) (Oral)  Resp 18  Ht 5\' 5"  (1.651 m)  Wt 220 lb (99.791 kg)  BMI 36.61 kg/m2  SpO2 99% Physical Exam  Constitutional: She is oriented to person, place, and time. She appears well-developed and well-nourished. No distress.  HENT:  Head: Normocephalic and atraumatic.  Eyes: EOM are normal.  Neck: Neck supple. No tracheal deviation present.  Cardiovascular: Normal rate.   Pulmonary/Chest: Effort normal. No respiratory distress.  Musculoskeletal: Normal range of motion.  Left great toe: flap type laceration to the left great toe just proximal to the nail. Bleeding controlled. Nail appears to be intact.   Neurological: She is  alert and oriented to person, place, and time.  DP pulse and sensation intact.   Skin: Skin is warm and dry.  Psychiatric: She has a normal mood and affect. Her behavior is normal.  Nursing note and vitals reviewed.   ED Course  Procedures (including critical care time)  COORDINATION OF CARE: 5:43 PM- Discussed treatment plan with patient at bedside and patient agreed to plan.   Labs Review Labs Reviewed - No data to display  Imaging Review Dg Toe Great Left  08/06/2014   CLINICAL DATA:  Crush injury to the great toe by a would not post. Hemorrhage. Laceration.  EXAM: LEFT GREAT TOE  COMPARISON:  None.  FINDINGS: There is no evidence of fracture or dislocation. There is no evidence of arthropathy or other focal bone abnormality. Soft tissues are unremarkable.  IMPRESSION: Normal   Electronically Signed   By: Nelson Chimes M.D.   On: 08/06/2014 18:07     EKG Interpretation None      LACERATION REPAIR Performed by: Rhyder Bratz L. Authorized by: Hale Bogus Consent: Verbal consent obtained. Risks and benefits: risks, benefits and alternatives were discussed Consent given by: patient Patient identity confirmed: provided demographic data Prepped and Draped in normal sterile fashion Wound explored  Laceration Location: left great toe Laceration Length: 2 cm  No Foreign Bodies seen or palpated  Anesthesia: digital block  anesthetic: lidocaine 2 % w/o epinephrine  Anesthetic total: 2 ml  Irrigation method: syringe Amount of cleaning: standard  Skin closure: 4-0 ethilon Number of sutures: 5  Technique: simple interrupted  Patient tolerance: Patient tolerated the procedure well with no immediate complications.  MDM   Final diagnoses:  Laceration of toe, initial encounter   Td updated.     Flap type laceration to the left great toe just proximal to the nail.  NV intact.  Bleeding controlled.  Pt agrees to proper wound care, dressing and sutures out in 10  days.  Return precautions also given  I personally performed the services described in this documentation, which was scribed in my presence. The recorded information has been reviewed and is accurate.    Kem Parkinson, PA-C 08/08/14 [place], MD 08/08/14 [id number]

## 2015-03-28 ENCOUNTER — Ambulatory Visit: Payer: BLUE CROSS/BLUE SHIELD | Admitting: Family Medicine

## 2015-03-28 ENCOUNTER — Encounter: Payer: Self-pay | Admitting: Family Medicine

## 2015-03-28 VITALS — BP 172/112 | HR 92 | Temp 98.5°F | Ht 65.0 in | Wt 226.4 lb

## 2015-03-28 DIAGNOSIS — E876 Hypokalemia: Secondary | ICD-10-CM

## 2015-03-28 DIAGNOSIS — I10 Essential (primary) hypertension: Secondary | ICD-10-CM

## 2015-03-28 DIAGNOSIS — S46911A Strain of unspecified muscle, fascia and tendon at shoulder and upper arm level, right arm, initial encounter: Secondary | ICD-10-CM

## 2015-03-28 DIAGNOSIS — R600 Localized edema: Secondary | ICD-10-CM

## 2015-03-28 MED ORDER — MELOXICAM 15 MG PO TABS: 15.0000 mg | ORAL_TABLET | Freq: Every day | ORAL | Status: DC

## 2015-03-28 MED ORDER — AMLODIPINE BESYLATE 10 MG PO TABS: 10.0000 mg | ORAL_TABLET | Freq: Every day | ORAL | Status: DC

## 2015-03-28 MED ORDER — FUROSEMIDE 40 MG PO TABS: 40.0000 mg | ORAL_TABLET | ORAL | Status: DC

## 2015-03-28 MED ORDER — LOSARTAN POTASSIUM 100 MG PO TABS: 100.0000 mg | ORAL_TABLET | Freq: Every day | ORAL | Status: DC

## 2015-03-28 MED ORDER — POTASSIUM CHLORIDE CRYS ER 20 MEQ PO TBCR: 20.0000 meq | EXTENDED_RELEASE_TABLET | Freq: Every day | ORAL | Status: DC

## 2015-03-28 MED ORDER — PREDNISONE 10 MG PO TABS: ORAL_TABLET | ORAL | Status: DC

## 2015-03-28 NOTE — Patient Instructions (Signed)
For each: Hold 10 seconds, repeat 3 times Run through all of them twice a day.  Generic Shoulder Exercises EXERCISES  RANGE OF MOTION (ROM) AND STRETCHING EXERCISES These exercises may help you when beginning to rehabilitate your injury. Your symptoms may resolve with or without further involvement from your physician, physical therapist or athletic trainer. While completing these exercises, remember:   Restoring tissue flexibility helps normal motion to return to the joints. This allows healthier, less painful movement and activity.  An effective stretch should be held for at least 30 seconds.  A stretch should never be painful. You should only feel a gentle lengthening or release in the stretched tissue. ROM - Pendulum  Bend at the waist so that your right / left arm falls away from your body. Support yourself with your opposite hand on a solid surface, such as a table or a countertop.  Your right / left arm should be perpendicular to the ground. If it is not perpendicular, you need to lean over farther. Relax the muscles in your right / left arm and shoulder as much as possible.  Gently sway your hips and trunk so they move your right / left arm without any use of your right / left shoulder muscles.  Progress your movements so that your right / left arm moves side to side, then forward and backward, and finally, both clockwise and counterclockwise.  Complete __________ repetitions in each direction. Many people use this exercise to relieve discomfort in their shoulder as well as to gain range of motion. Repeat __________ times. Complete this exercise __________ times per day. STRETCH - Flexion, Standing  Stand with good posture. With an underhand grip on your right / left hand and an overhand grip on the opposite hand, grasp a broomstick or cane so that your hands are a little more than shoulder-width apart.  Keeping your right / left elbow straight and shoulder muscles relaxed, push  the stick with your opposite hand to raise your right / left arm in front of your body and then overhead. Raise your arm until you feel a stretch in your right / left shoulder, but before you have increased shoulder pain.  Try to avoid shrugging your right / left shoulder as your arm rises by keeping your shoulder blade tucked down and toward your mid-back spine. Hold __________ seconds.  Slowly return to the starting position. Repeat __________ times. Complete this exercise __________ times per day. STRETCH - Internal Rotation  Place your right / left hand behind your back, palm-up.  Throw a towel or belt over your opposite shoulder. Grasp the towel/belt with your right / left hand.  While keeping an upright posture, gently pull up on the towel/belt until you feel a stretch in the front of your right / left shoulder.  Avoid shrugging your right / left shoulder as your arm rises by keeping your shoulder blade tucked down and toward your mid-back spine.  Hold __________. Release the stretch by lowering your opposite hand. Repeat __________ times. Complete this exercise __________ times per day. STRETCH - External Rotation and Abduction  Stagger your stance through a doorframe. It does not matter which foot is forward.  As instructed by your physician, physical therapist or athletic trainer, place your hands:  And forearms above your head and on the door frame.  And forearms at head-height and on the door frame.  At elbow-height and on the door frame.  Keeping your head and chest upright and your stomach  muscles tight to prevent over-extending your low-back, slowly shift your weight onto your front foot until you feel a stretch across your chest and/or in the front of your shoulders.  Hold __________ seconds. Shift your weight to your back foot to release the stretch. Repeat __________ times. Complete this stretch __________ times per day.  STRENGTHENING EXERCISES  These exercises  may help you when beginning to rehabilitate your injury. They may resolve your symptoms with or without further involvement from your physician, physical therapist or athletic trainer. While completing these exercises, remember:   Muscles can gain both the endurance and the strength needed for everyday activities through controlled exercises.  Complete these exercises as instructed by your physician, physical therapist or athletic trainer. Progress the resistance and repetitions only as guided.  You may experience muscle soreness or fatigue, but the pain or discomfort you are trying to eliminate should never worsen during these exercises. If this pain does worsen, stop and make certain you are following the directions exactly. If the pain is still present after adjustments, discontinue the exercise until you can discuss the trouble with your clinician.  If advised by your physician, during your recovery, avoid activity or exercises which involve actions that place your right / left hand or elbow above your head or behind your back or head. These positions stress the tissues which are trying to heal. STRENGTH - Scapular Depression and Adduction  With good posture, sit on a firm chair. Supported your arms in front of you with pillows, arm rests or a table top. Have your elbows in line with the sides of your body.  Gently draw your shoulder blades down and toward your mid-back spine. Gradually increase the tension without tensing the muscles along the top of your shoulders and the back of your neck.  Hold for __________ seconds. Slowly release the tension and relax your muscles completely before completing the next repetition.  After you have practiced this exercise, remove the arm support and complete it in standing as well as sitting. Repeat __________ times. Complete this exercise __________ times per day.  STRENGTH - External Rotators  Secure a rubber exercise band/tubing to a fixed object so  that it is at the same height as your right / left elbow when you are standing or sitting on a firm surface.  Stand or sit so that the secured exercise band/tubing is at your side that is not injured.  Bend your elbow 90 degrees. Place a folded towel or small pillow under your right / left arm so that your elbow is a few inches away from your side.  Keeping the tension on the exercise band/tubing, pull it away from your body, as if pivoting on your elbow. Be sure to keep your body steady so that the movement is only coming from your shoulder rotating.  Hold __________ seconds. Release the tension in a controlled manner as you return to the starting position. Repeat __________ times. Complete this exercise __________ times per day.  STRENGTH - Supraspinatus  Stand or sit with good posture. Grasp a __________ weight or an exercise band/tubing so that your hand is "thumbs-up," like when you shake hands.  Slowly lift your right / left hand from your thigh into the air, traveling about 30 degrees from straight out at your side. Lift your hand to shoulder height or as far as you can without increasing any shoulder pain. Initially, many people do not lift their hands above shoulder height.  Avoid shrugging your  right / left shoulder as your arm rises by keeping your shoulder blade tucked down and toward your mid-back spine.  Hold for __________ seconds. Control the descent of your hand as you slowly return to your starting position. Repeat __________ times. Complete this exercise __________ times per day.  STRENGTH - Shoulder Extensors  Secure a rubber exercise band/tubing so that it is at the height of your shoulders when you are either standing or sitting on a firm arm-less chair.  With a thumbs-up grip, grasp an end of the band/tubing in each hand. Straighten your elbows and lift your hands straight in front of you at shoulder height. Step back away from the secured end of band/tubing until it  becomes tense.  Squeezing your shoulder blades together, pull your hands down to the sides of your thighs. Do not allow your hands to go behind you.  Hold for __________ seconds. Slowly ease the tension on the band/tubing as you reverse the directions and return to the starting position. Repeat __________ times. Complete this exercise __________ times per day.  STRENGTH - Scapular Retractors  Secure a rubber exercise band/tubing so that it is at the height of your shoulders when you are either standing or sitting on a firm arm-less chair.  With a palm-down grip, grasp an end of the band/tubing in each hand. Straighten your elbows and lift your hands straight in front of you at shoulder height. Step back away from the secured end of band/tubing until it becomes tense.  Squeezing your shoulder blades together, draw your elbows back as you bend them. Keep your upper arm lifted away from your body throughout the exercise.  Hold __________ seconds. Slowly ease the tension on the band/tubing as you reverse the directions and return to the starting position. Repeat __________ times. Complete this exercise __________ times per day. STRENGTH - Scapular Depressors  Find a sturdy chair without wheels, such as a from a dining room table.  Keeping your feet on the floor, lift your bottom from the seat and lock your elbows.  Keeping your elbows straight, allow gravity to pull your body weight down. Your shoulders will rise toward your ears.  Raise your body against gravity by drawing your shoulder blades down your back, shortening the distance between your shoulders and ears. Although your feet should always maintain contact with the floor, your feet should progressively support less body weight as you get stronger.  Hold __________ seconds. In a controlled and slow manner, lower your body weight to begin the next repetition. Repeat __________ times. Complete this exercise __________ times per day.      This information is not intended to replace advice given to you by your health care provider. Make sure you discuss any questions you have with your health care provider.   Document Released: 11/27/2004 Document Revised: 02/03/2014 Document Reviewed: 04/27/2008 Elsevier Interactive Patient Education [employer].

## 2015-03-28 NOTE — Progress Notes (Signed)
 Subjective:  Patient ID: Kelli Thompson, female    DOB: 11/18/1965  Age: 49 y.o. MRN: [id number]  CC: Establish Care   HPI Kelli Thompson presents for 5 mos of right arm pain. Transferring here because previous M.D. Told her ankles swollen due to her age and weight. Pain worse with shoulder motion. Had heart monitor for syncope 15 months ago. Came back normal.Felt dyspneic & heart raced. Better since started potassium - OTC. 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   History Kelli Thompson has a past medical history of Hypertension; Pancreatitis; Anxiety; Urinary frequency (01/30/2014); Urinary incontinence, mixed (01/30/2014); and Peri-menopause (02/28/2014).   She has past surgical history that includes Cesarean section; Cholecystectomy; Appendectomy; and Abdominal hysterectomy.   Her family history includes Alcohol abuse in her paternal grandfather; Arthritis in her maternal grandmother; Asthma in her father; Cancer in her paternal grandmother; Congestive Heart Failure in her maternal grandmother; Diabetes in her maternal grandmother and mother; Hypertension in her father and mother; Other in her son; Parkinson's disease in her maternal grandfather; Stroke in her father.She reports that she quit smoking about 2 years ago. Her smoking use included Cigarettes. She has a 15 pack-year smoking history. She has never used smokeless tobacco. She reports that she does not drink alcohol or use illicit drugs.  Current Outpatient Prescriptions on File Prior to Visit  Medication Sig Dispense Refill  . Fish Oil-Cholecalciferol (FISH OIL + D3 PO) Take 1 capsule by mouth daily.    . Multiple Vitamin (MULTIVITAMIN WITH MINERALS) TABS tablet Take 1 tablet by mouth daily.    . [DISCONTINUED] mirabegron ER (MYRBETRIQ) 25 MG TB24 tablet Take 1 tablet (25 mg total) by mouth daily. 30 tablet 11   No current facility-administered medications on file prior to visit.    ROS Review of Systems  Constitutional: Negative  for fever, activity change and appetite change.  HENT: Negative for congestion, rhinorrhea and sore throat.   Eyes: Negative for visual disturbance.  Respiratory: Negative for cough and shortness of breath.   Cardiovascular: Negative for chest pain and palpitations.  Gastrointestinal: Negative for nausea, abdominal pain and diarrhea.  Genitourinary: Negative for dysuria.  Musculoskeletal: Negative for myalgias and arthralgias.    Objective:  BP 172/112 mmHg  Pulse 92  Temp(Src) 98.5 F (36.9 C) (Oral)  Ht 5' 5" (1.651 m)  Wt 226 lb 6.4 oz (102.694 kg)  BMI 37.67 kg/m2  SpO2 98%  Physical Exam  Constitutional: She is oriented to person, place, and time. She appears well-developed and well-nourished. No distress.  HENT:  Head: Normocephalic and atraumatic.  Right Ear: External ear normal.  Left Ear: External ear normal.  Nose: Nose normal.  Mouth/Throat: Oropharynx is clear and moist.  Eyes: Conjunctivae and EOM are normal. Pupils are equal, round, and reactive to light.  Neck: Normal range of motion. Neck supple. No thyromegaly present.  Cardiovascular: Normal rate, regular rhythm and normal heart sounds.   No murmur heard. Pulmonary/Chest: Effort normal and breath sounds normal. No respiratory distress. She has no wheezes. She has no rales.  Abdominal: Soft. Bowel sounds are normal. She exhibits no distension. There is no tenderness.  Lymphadenopathy:    She has no cervical adenopathy.  Neurological: She is alert and oriented to person, place, and time. She has normal reflexes.  Skin: Skin is warm and dry.  Psychiatric: She has a normal mood and affect. Her behavior is normal. Judgment and thought content normal.    Assessment & Plan:     Kelli Thompson was seen today for establish care.  Diagnoses and all orders for this visit:  Localized edema -     CMP14+EGFR  Essential hypertension  Hypokalemia  Shoulder strain, right, initial encounter  Other orders -      predniSONE (DELTASONE) 10 MG tablet; For shoulder: Take 5 daily for 3 days followed by 4,3,2 and 1 for 3 days each. -     meloxicam (MOBIC) 15 MG tablet; Take 1 tablet (15 mg total) by mouth daily. For joint and muscle pain. Start after finishing prednisone -     furosemide (LASIX) 40 MG tablet; Take 1 tablet (40 mg total) by mouth every morning. To reduce swelling -     potassium chloride SA (K-DUR,KLOR-CON) 20 MEQ tablet; Take 1 tablet (20 mEq total) by mouth daily. For potassium -     amLODipine (NORVASC) 10 MG tablet; Take 1 tablet (10 mg total) by mouth daily. For blood pressure -     losartan (COZAAR) 100 MG tablet; Take 1 tablet (100 mg total) by mouth daily. For blood pressure  I have discontinued Ms. Joerger's traMADol and cephALEXin. I have also changed her furosemide, amLODipine, and losartan. Additionally, I am having her start on predniSONE, meloxicam, and potassium chloride SA. Lastly, I am having her maintain her multivitamin with minerals and Fish Oil-Cholecalciferol (FISH OIL + D3 PO).  Meds ordered this encounter  Medications  . predniSONE (DELTASONE) 10 MG tablet    Sig: For shoulder: Take 5 daily for 3 days followed by 4,3,2 and 1 for 3 days each.    Dispense:  45 tablet    Refill:  0  . meloxicam (MOBIC) 15 MG tablet    Sig: Take 1 tablet (15 mg total) by mouth daily. For joint and muscle pain. Start after finishing prednisone    Dispense:  30 tablet    Refill:  5  . furosemide (LASIX) 40 MG tablet    Sig: Take 1 tablet (40 mg total) by mouth every morning. To reduce swelling    Dispense:  30 tablet    Refill:  2  . potassium chloride SA (K-DUR,KLOR-CON) 20 MEQ tablet    Sig: Take 1 tablet (20 mEq total) by mouth daily. For potassium    Dispense:  30 tablet    Refill:  3  . amLODipine (NORVASC) 10 MG tablet    Sig: Take 1 tablet (10 mg total) by mouth daily. For blood pressure    Dispense:  30 tablet    Refill:  2  . losartan (COZAAR) 100 MG tablet    Sig: Take 1  tablet (100 mg total) by mouth daily. For blood pressure    Dispense:  30 tablet    Refill:  5   Shoulder exercises printed on AVS  Follow-up: Return in about 1 month (around 04/28/2015).  Warren Stacks, M.D.  

## 2015-03-29 LAB — CMP14+EGFR
A/G RATIO: 1.8 (ref 1.1–2.5)
ALBUMIN: 4.4 g/dL (ref 3.5–5.5)
ALT: 20 IU/L (ref 0–32)
AST: 14 IU/L (ref 0–40)
Alkaline Phosphatase: 96 IU/L (ref 39–117)
BILIRUBIN TOTAL: 0.3 mg/dL (ref 0.0–1.2)
BUN/Creatinine Ratio: 17 (ref 9–23)
BUN: 15 mg/dL (ref 6–24)
CHLORIDE: 102 mmol/L (ref 96–106)
CO2: 24 mmol/L (ref 18–29)
Calcium: 9.5 mg/dL (ref 8.7–10.2)
Creatinine, Ser: 0.9 mg/dL (ref 0.57–1.00)
GFR calc non Af Amer: 75 mL/min/{1.73_m2} (ref >59)
GFR, EST AFRICAN AMERICAN: 87 mL/min/{1.73_m2} (ref >59)
Globulin, Total: 2.5 g/dL (ref 1.5–4.5)
Glucose: 95 mg/dL (ref 65–99)
POTASSIUM: 3.9 mmol/L (ref 3.5–5.2)
Sodium: 141 mmol/L (ref 134–144)
TOTAL PROTEIN: 6.9 g/dL (ref 6.0–8.5)

## 2015-03-30 ENCOUNTER — Telehealth: Payer: Self-pay | Admitting: *Deleted

## 2015-03-30 NOTE — Telephone Encounter (Signed)
Pt notified of results

## 2015-03-30 NOTE — Telephone Encounter (Signed)
-----   Message from Claretta Fraise, MD sent at 03/30/2015 10:42 AM EST ----- Hello Serenitee,    Your lab result is normal.Some minor variations that are not significant are commonly marked abnormal, but do not represent any medical problem for you.  Best regards, Claretta Fraise, M.D.

## 2015-04-30 ENCOUNTER — Encounter: Payer: Self-pay | Admitting: Family Medicine

## 2015-04-30 ENCOUNTER — Ambulatory Visit (INDEPENDENT_AMBULATORY_CARE_PROVIDER_SITE_OTHER): Payer: Self-pay | Admitting: Family Medicine

## 2015-04-30 VITALS — BP 148/96 | HR 81 | Temp 98.1°F | Ht 65.0 in | Wt 228.6 lb

## 2015-04-30 DIAGNOSIS — R600 Localized edema: Secondary | ICD-10-CM | POA: Insufficient documentation

## 2015-04-30 DIAGNOSIS — E876 Hypokalemia: Secondary | ICD-10-CM

## 2015-04-30 DIAGNOSIS — I10 Essential (primary) hypertension: Secondary | ICD-10-CM

## 2015-04-30 MED ORDER — VALSARTAN 320 MG PO TABS: 320.0000 mg | ORAL_TABLET | Freq: Every day | ORAL | Status: DC

## 2015-04-30 NOTE — Patient Instructions (Signed)
Kegel Exercises  The goal of Kegel exercises is to isolate and exercise your pelvic floor muscles. These muscles act as a hammock that supports the rectum, vagina, small intestine, and uterus. As the muscles weaken, the hammock sags and these organs are displaced from their normal positions. Kegel exercises can strengthen your pelvic floor muscles and help you to improve bladder and bowel control, improve sexual response, and help reduce many problems and some discomfort during pregnancy. Kegel exercises can be done anywhere and at any time.  HOW TO PERFORM KEGEL EXERCISES  1. Locate your pelvic floor muscles. To do this, squeeze (contract) the muscles that you use when you try to stop the flow of urine. You will feel a tightness in the vaginal area (women) and a tight lift in the rectal area (men and women).  2. When you begin, contract your pelvic muscles tight for 2-5 seconds, then relax them for 2-5 seconds. This is one set. Do 4-5 sets with a short pause in between.  3. Contract your pelvic muscles for 8-10 seconds, then relax them for 8-10 seconds. Do 4-5 sets. If you cannot contract your pelvic muscles for 8-10 seconds, try 5-7 seconds and work your way up to 8-10 seconds. Your goal is 4-5 sets of 10 contractions each day.  Keep your stomach, buttocks, and legs relaxed during the exercises. Perform sets of both short and long contractions. Vary your positions. Perform these contractions 3-4 times per day. Perform sets while you are:    · Lying in bed in the morning.  · Standing at lunch.  · Sitting in the late afternoon.  · Lying in bed at night.   You should do 40-50 contractions per day. Do not perform more Kegel exercises per day than recommended. Overexercising can cause muscle fatigue. Continue these exercises for for at least 15-20 weeks or as directed by your caregiver.     This information is not intended to replace advice given to you by your health care provider. Make sure you discuss any questions  you have with your health care provider.     Document Released: 12/31/2011 Document Revised: 02/03/2014 Document Reviewed: 12/31/2011  Elsevier Interactive Patient Education ©2016 Elsevier Inc.
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
## 2015-05-01 LAB — CMP14+EGFR
A/G RATIO: 1.8 (ref 1.2–2.2)
ALK PHOS: 103 IU/L (ref 39–117)
ALT: 27 IU/L (ref 0–32)
AST: 15 IU/L (ref 0–40)
Albumin: 4.7 g/dL (ref 3.5–5.5)
BUN/Creatinine Ratio: 16 (ref 9–23)
BUN: 14 mg/dL (ref 6–24)
Bilirubin Total: 0.3 mg/dL (ref 0.0–1.2)
CHLORIDE: 104 mmol/L (ref 96–106)
CO2: 22 mmol/L (ref 18–29)
Calcium: 9.4 mg/dL (ref 8.7–10.2)
Creatinine, Ser: 0.9 mg/dL (ref 0.57–1.00)
GFR calc non Af Amer: 75 mL/min/{1.73_m2} (ref >59)
GFR, EST AFRICAN AMERICAN: 87 mL/min/{1.73_m2} (ref >59)
GLUCOSE: 90 mg/dL (ref 65–99)
Globulin, Total: 2.6 g/dL (ref 1.5–4.5)
POTASSIUM: 4.3 mmol/L (ref 3.5–5.2)
Sodium: 145 mmol/L — ABNORMAL HIGH (ref 134–144)
Total Protein: 7.3 g/dL (ref 6.0–8.5)

## 2015-05-01 NOTE — Progress Notes (Signed)
Patient aware.

## 2015-06-28 ENCOUNTER — Other Ambulatory Visit: Payer: Self-pay | Admitting: Family Medicine

## 2015-07-26 ENCOUNTER — Other Ambulatory Visit: Payer: Self-pay | Admitting: Family Medicine

## 2015-08-01 ENCOUNTER — Encounter (HOSPITAL_COMMUNITY): Payer: Self-pay | Admitting: Emergency Medicine

## 2015-08-01 ENCOUNTER — Emergency Department (HOSPITAL_COMMUNITY)
Admission: EM | Admit: 2015-08-01 | Discharge: 2015-08-02 | Disposition: A | Discharge status: Home / Self Care | Payer: BLUE CROSS/BLUE SHIELD | Attending: Emergency Medicine | Admitting: Emergency Medicine

## 2015-08-01 DIAGNOSIS — Z87891 Personal history of nicotine dependence: Secondary | ICD-10-CM | POA: Insufficient documentation

## 2015-08-01 DIAGNOSIS — H15001 Unspecified scleritis, right eye: Secondary | ICD-10-CM | POA: Insufficient documentation

## 2015-08-01 DIAGNOSIS — I10 Essential (primary) hypertension: Secondary | ICD-10-CM | POA: Insufficient documentation

## 2015-08-01 DIAGNOSIS — H02843 Edema of right eye, unspecified eyelid: Secondary | ICD-10-CM

## 2015-08-01 DIAGNOSIS — T7840XA Allergy, unspecified, initial encounter: Secondary | ICD-10-CM

## 2015-08-01 MED ORDER — SODIUM CHLORIDE 0.9 % IV SOLN
INTRAVENOUS | Status: DC
  Administered 2015-08-01: 23:00:00 via INTRAVENOUS

## 2015-08-01 MED ORDER — DIPHENHYDRAMINE HCL 50 MG/ML IJ SOLN
25.0000 mg | Freq: Once | INTRAMUSCULAR | Status: AC
  Administered 2015-08-01: 25 mg via INTRAVENOUS
  Filled 2015-08-01: qty 1

## 2015-08-01 MED ORDER — FAMOTIDINE IN NACL 20-0.9 MG/50ML-% IV SOLN
20.0000 mg | Freq: Once | INTRAVENOUS | Status: AC
  Administered 2015-08-01: 20 mg via INTRAVENOUS
  Filled 2015-08-01: qty 50

## 2015-08-01 MED ORDER — METHYLPREDNISOLONE SODIUM SUCC 125 MG IJ SOLR
125.0000 mg | Freq: Once | INTRAMUSCULAR | Status: AC
  Administered 2015-08-01: 125 mg via INTRAVENOUS
  Filled 2015-08-01: qty 2

## 2015-08-01 NOTE — ED Provider Notes (Signed)
CSN: [id number]     Arrival date & time 08/01/15  2248 History  By signing my name below, I, Higinio Plan, attest that this documentation has been prepared under the direction and in the presence of Rolland Porter, MD at 23:03 PM. Electronically Signed: Higinio Plan, Scribe. 08/01/2015. 11:36 PM.   Chief Complaint  Patient presents with  . Allergic Reaction   The history is provided by the patient. No language interpreter was used.   HPI Comments: Kelli Thompson is a 50 y.o. female who presents to the Emergency Department complaining of pruritic, right eye swelling that began ~30 minutes PTA. Pt notes she was watching tv at the time of onset. She notes she felt as if her eye was irritated or had something in it; she states it was itching and watering. Pt reports her eye is not currently pruritic in the ED; however, she states her eye still feels irritated now. She denies any visual disturbance. She denies swelling or irritation of her left eye. Pt reports she has experienced similar symptoms before in which it felt like "there was a water pocket on her eye" ~1 year ago. Pt reports she ate a roast beef and cheddar sandwhich, shake, and a potato cake at [employer] for dinner; she denies eaten anything else since she returned home. Specifically she denies any exposure to sea food. She also denies taking any new medicine, difficulty swallowing, difficulty breathing, and being outside recently.  PCP Dr. Livia Snellen at [place]. Past Medical History  Diagnosis Date  . Hypertension   . Pancreatitis   . Anxiety   . Urinary frequency 01/30/2014  . Urinary incontinence, mixed 01/30/2014  . Peri-menopause 02/28/2014   Past Surgical History  Procedure Laterality Date  . Cesarean section      x2  . Cholecystectomy    . Appendectomy    . Abdominal hysterectomy     Family History  Problem Relation Age of Onset  . Hypertension Mother   . Diabetes Mother     borderline  . Hypertension Father   .  Asthma Father   . Stroke Father   . Diabetes Maternal Grandmother   . Congestive Heart Failure Maternal Grandmother   . Arthritis Maternal Grandmother   . Parkinson's disease Maternal Grandfather   . Cancer Paternal Grandmother   . Alcohol abuse Paternal Grandfather   . Other Son     stomach issues   Social History  Substance Use Topics  . Smoking status: Former Smoker -- 0.50 packs/day for 30 years    Types: Cigarettes    Quit date: 03/27/2013  . Smokeless tobacco: Never Used  . Alcohol Use: No  employed  OB History    Gravida Para Term Preterm AB TAB SAB Ectopic Multiple Living   3 3 3       3      Review of Systems  HENT: Negative for trouble swallowing.   Eyes: Positive for itching.  Respiratory: Negative for shortness of breath.    Allergies  Ace inhibitors  Home Medications   Prior to Admission medications   Medication Sig Start Date End Date Taking? Authorizing Provider  amLODipine (NORVASC) 10 MG tablet Take 1 tablet (10 mg total) by mouth daily. For blood pressure 03/28/15   Claretta Fraise, MD  Cetirizine HCl (ZYRTEC ALLERGY) 10 MG CAPS Take 1 capsule (10 mg total) by mouth daily as needed. 08/02/15   Rolland Porter, MD  famotidine (PEPCID) 20 MG tablet Take 1 tablet (20  mg total) by mouth 2 (two) times daily. 08/02/15   Rolland Porter, MD  Fish Oil-Cholecalciferol (FISH OIL + D3 PO) Take 1 capsule by mouth daily.    Historical Provider, MD  furosemide (LASIX) 40 MG tablet TAKE 1 TABLET BY MOUTH EVERY MORNING TO REDUCE SWELLING 06/29/15   Claretta Fraise, MD  meloxicam (MOBIC) 15 MG tablet Take 1 tablet (15 mg total) by mouth daily. For joint and muscle pain. Start after finishing prednisone 03/28/15   Claretta Fraise, MD  Multiple Vitamin (MULTIVITAMIN WITH MINERALS) TABS tablet Take 1 tablet by mouth daily.    Historical Provider, MD  potassium chloride SA (K-DUR,KLOR-CON) 20 MEQ tablet TAKE 1 TABLET BY MOUTH DAILY FOR POTASSIUM 07/26/15   Claretta Fraise, MD  predniSONE (DELTASONE) 20 MG  tablet Take 3 po QD x 3d , then 2 po QD x 3d then 1 po QD x 3d 08/02/15   Rolland Porter, MD  valsartan (DIOVAN) 320 MG tablet Take 1 tablet (320 mg total) by mouth daily. 04/30/15   Claretta Fraise, MD   BP 151/95 mmHg  Pulse 87  Temp(Src) 98.7 F (37.1 C) (Oral)  Resp 18  Ht 5\' 5"  (1.651 m)  Wt 209 lb (94.802 kg)  BMI 34.78 kg/m2  SpO2 98%  Vital signs normal   Physical Exam  Constitutional: She is oriented to person, place, and time. She appears well-developed and well-nourished.  Non-toxic appearance. She does not appear ill. No distress.  HENT:  Head: Normocephalic and atraumatic.  Right Ear: External ear normal.  Left Ear: External ear normal.  Nose: Nose normal. No mucosal edema or rhinorrhea.  Mouth/Throat: Oropharynx is clear and moist and mucous membranes are normal. No dental abscesses or uvula swelling.  Eyes: EOM are normal. Pupils are equal, round, and reactive to light.  She has diffuse soft non-erythematous swelling of her right eyelids with sclera edema that is raised above the level of the pupil on the right   Neck: Normal range of motion and full passive range of motion without pain. Neck supple.  Cardiovascular: Normal rate, regular rhythm and normal heart sounds.  Exam reveals no gallop and no friction rub.   No murmur heard. Pulmonary/Chest: Effort normal and breath sounds normal. No respiratory distress. She has no wheezes. She has no rhonchi. She has no rales. She exhibits no tenderness and no crepitus.  Abdominal: Soft. Normal appearance and bowel sounds are normal. She exhibits no distension. There is no tenderness. There is no rebound and no guarding.  Musculoskeletal: Normal range of motion. She exhibits no edema or tenderness.  Moves all extremities well.   Neurological: She is alert and oriented to person, place, and time. She has normal strength. No cranial nerve deficit.  Skin: Skin is warm, dry and intact. No rash noted. No erythema. No pallor.  Psychiatric:  She has a normal mood and affect. Her speech is normal and behavior is normal. Her mood appears not anxious.  Nursing note and vitals reviewed.   ED Course  Procedures  Medications  0.9 %  sodium chloride infusion ( Intravenous New Bag/Given 08/01/15 2329)  diphenhydrAMINE (BENADRYL) injection 25 mg (25 mg Intravenous Given 08/01/15 2328)  methylPREDNISolone sodium succinate (SOLU-MEDROL) 125 mg/2 mL injection 125 mg (125 mg Intravenous Given 08/01/15 2328)  famotidine (PEPCID) IVPB 20 mg premix (0 mg Intravenous Stopped 08/02/15 0004)    DIAGNOSTIC STUDIES:  Oxygen Saturation is 98% on RA, normal by my interpretation.    COORDINATION OF CARE:  11:15  PM Discussed treatment plan with pt at bedside and pt agreed to plan.  12:41 AM Pt reports she is feeling a lot better and her right eye swelling is reduced.   Patient's eye has the appearance of what I see before with people had local exposure especially to shrimp or seafood. Patient has not had that exposure but she states she could've had something else on her hands when she rubbed her eyes. This appears to be allergic and focal. She has no other swelling of her face or her other eyelids. She responded well to typical allergic medications and will be discharged home on the same. She was referred to ophthalmology if she's not improving. She should be rechecked in the ED if it spreads to other areas of her face or body.    MDM   Final diagnoses:  Scleritis, right  Swelling of eyelid, right  Allergic reaction, initial encounter    New Prescriptions   CETIRIZINE HCL (ZYRTEC ALLERGY) 10 MG CAPS    Take 1 capsule (10 mg total) by mouth daily as needed.   FAMOTIDINE (PEPCID) 20 MG TABLET    Take 1 tablet (20 mg total) by mouth 2 (two) times daily.   PREDNISONE (DELTASONE) 20 MG TABLET    Take 3 po QD x 3d , then 2 po QD x 3d then 1 po QD x 3d    Plan discharge  Rolland Porter, MD, Barbette Or, MD 08/02/15 0110

## 2015-08-01 NOTE — ED Notes (Signed)
Pt c/o rt eye swelling x 30 minutes.

## 2015-08-02 MED ORDER — CETIRIZINE HCL 10 MG PO CAPS: 10.0000 mg | ORAL_CAPSULE | Freq: Every day | ORAL | Status: DC | PRN

## 2015-08-02 MED ORDER — FAMOTIDINE 20 MG PO TABS: 20.0000 mg | ORAL_TABLET | Freq: Two times a day (BID) | ORAL | Status: DC

## 2015-08-02 MED ORDER — PREDNISONE 20 MG PO TABS: ORAL_TABLET | ORAL | Status: DC

## 2015-08-02 NOTE — Discharge Instructions (Signed)
Cool compresses will make her eyes feel better. Take the prednisone and Pepcid until gone. You can take the Zyrtec once a day which is a anti-histamine and may make you sleepy, however it would help with the itching and swelling. Recheck if you have a change in your vision, the swelling starts affecting other parts of your body or face, if you have difficulty swallowing or breathing. If you are not improving, you can be rechecked by the ophthalmologist in [place], Dr. Iona Hansen. Call his office for an appointment.   Scleritis and Episcleritis The outer part of the eyeball is covered with a tough fibrous covering called the sclera. It is the white part of the eye. This tough covering also has a thin membrane lying on top of it called the episclera.   When the sclera becomes red and sore (inflamed), it is called scleritis.  When the episclera becomes inflamed, it is called episcleritis. CAUSES   Scleritis is usually more severe and is associated with autoimmune diseases such as:  Rheumatoid arthritis.  Inflammations of the bowel such as Crohn's Disease (regional enteritis).  Ulcerative colitis.  Episcleritis usually has no known cause. SYMPTOMS  Both scleritis and episcleritis cause red patches or a nodule on the eye. DIAGNOSIS  This condition should be examined by an ophthalmologist. This is because very strong medications that have side effects to the body and eye may be required to treat severe attacks. Further investigations into the patient's general health may be necessary. TREATMENT   Episcleritis tends to get better without treatment within a week or two.  Scleritis is more severe. Often, your caregiver will prescribe steroids by mouth (orally) or as drops in the eye. This treatment helps lessen the redness and soreness (inflammation). HOME CARE INSTRUCTIONS   Take all medications as directed.  Keep your follow-up appointments as directed.  Avoid irritation of the involved  eye(s).  Stop using hard or soft contact lenses until your caregiver tells you that it is safe to use them. SEEK MEDICAL CARE IF:   Redness or irritation gets worse.  You develop pain or sensitivity to light.  You develop any change in vision in the involved eye(s).   This information is not intended to replace advice given to you by your health care provider. Make sure you discuss any questions you have with your health care provider.   Document Released: 01/07/2001 Document Revised: 04/07/2011 Document Reviewed: 05/11/2008 Elsevier Interactive Patient Education [employer].

## 2015-08-03 ENCOUNTER — Ambulatory Visit: Payer: Self-pay | Admitting: Family Medicine

## 2015-08-03 ENCOUNTER — Encounter: Payer: Self-pay | Admitting: Family Medicine

## 2015-08-03 ENCOUNTER — Ambulatory Visit (INDEPENDENT_AMBULATORY_CARE_PROVIDER_SITE_OTHER): Payer: Self-pay | Admitting: Family Medicine

## 2015-08-03 VITALS — BP 134/94 | HR 83 | Temp 97.8°F | Ht 65.0 in | Wt 216.0 lb

## 2015-08-03 DIAGNOSIS — I10 Essential (primary) hypertension: Secondary | ICD-10-CM

## 2015-08-03 MED ORDER — CARVEDILOL 6.25 MG PO TABS: 6.2500 mg | ORAL_TABLET | Freq: Two times a day (BID) | ORAL | Status: DC

## 2015-08-03 NOTE — Addendum Note (Signed)
Addended by: Zannie Cove on: 08/03/2015 09:12 AM   Modules accepted: Miquel Dunn

## 2015-08-03 NOTE — Patient Instructions (Signed)
Continue current medications. Continue good therapeutic lifestyle changes which include good diet and exercise. Fall precautions discussed with patient. If an FOBT was given today- please return it to our front desk. If you are over 50 years old - you may need Prevnar 13 or the adult Pneumonia vaccine.   After your visit with us today you will receive a survey in the mail or online from [employer] regarding your care with us. Please take a moment to fill this out. Your feedback is very important to us as you can help us better understand your patient needs as well as improve your experience and satisfaction. WE CARE ABOUT YOU!!!    

## 2015-08-03 NOTE — Progress Notes (Signed)
Subjective:    Patient ID: Kelli Thompson, female    DOB: [birth year], 50 y.o.   MRN: [id number]  HPI Pt here for follow up and management of chronic medical problems. Which includes hypertension. She is taking medications regularly. 50 year old female here to follow-up hypertension and lipids. Note that her blood pressure was elevated at last visit and again today it is not at goal. She is on maximal doses of ARB and calcium channel blocker and also takes furosemide. There was a concern about last Pap smear but she is status post hysterectomy and I do not think Pap smears are necessary in that scenario. She was recently seen in the emergency room for allergic reaction and one of her eyes. That resolved with some steroids. She is concerned that the steroids would not mix with her meloxicam but was reassured in that regard.   Depression screen [facility] 2/9 08/03/2015 04/30/2015 03/28/2015  Decreased Interest 0 0 0  Down, Depressed, Hopeless 0 0 0  PHQ - 2 Score 0 0 0      Patient Active Problem List   Diagnosis Date Noted  . Localized edema 04/30/2015  . Peri-menopause 02/28/2014  . Urinary incontinence, mixed 01/30/2014  . Hypokalemia 04/18/2013  . HTN (hypertension) 04/17/2013   Outpatient Encounter Prescriptions as of 08/03/2015  Medication Sig  . amLODipine (NORVASC) 10 MG tablet Take 1 tablet (10 mg total) by mouth daily. For blood pressure  . Cetirizine HCl (ZYRTEC ALLERGY) 10 MG CAPS Take 1 capsule (10 mg total) by mouth daily as needed.  . famotidine (PEPCID) 20 MG tablet Take 1 tablet (20 mg total) by mouth 2 (two) times daily.  . Fish Oil-Cholecalciferol (FISH OIL + D3 PO) Take 1 capsule by mouth daily.  . furosemide (LASIX) 40 MG tablet TAKE 1 TABLET BY MOUTH EVERY MORNING TO REDUCE SWELLING  . meloxicam (MOBIC) 15 MG tablet Take 1 tablet (15 mg total) by mouth daily. For joint and muscle pain. Start after finishing prednisone  . Multiple Vitamin (MULTIVITAMIN WITH MINERALS) TABS  tablet Take 1 tablet by mouth daily.  . potassium chloride SA (K-DUR,KLOR-CON) 20 MEQ tablet TAKE 1 TABLET BY MOUTH DAILY FOR POTASSIUM  . predniSONE (DELTASONE) 20 MG tablet Take 3 po QD x 3d , then 2 po QD x 3d then 1 po QD x 3d  . valsartan (DIOVAN) 320 MG tablet Take 1 tablet (320 mg total) by mouth daily.   No facility-administered encounter medications on file as of 08/03/2015.      Review of Systems  Constitutional: Negative.   HENT: Negative.   Eyes: Negative.   Respiratory: Negative.   Cardiovascular: Negative.   Gastrointestinal: Negative.   Endocrine: Negative.   Genitourinary: Negative.   Musculoskeletal: Negative.   Skin: Negative.   Allergic/Immunologic: Negative.   Neurological: Negative.   Hematological: Negative.   Psychiatric/Behavioral: Negative.        Objective:   Physical Exam  Constitutional: She is oriented to person, place, and time. She appears well-developed and well-nourished.  Cardiovascular: Normal rate, regular rhythm, normal heart sounds and intact distal pulses.   Pulmonary/Chest: Breath sounds normal.  Neurological: She is alert and oriented to person, place, and time.  Psychiatric: She has a normal mood and affect. Her behavior is normal.    BP 134/94 mmHg  Pulse 83  Temp(Src) 97.8 F (36.6 C) (Oral)  Ht 5' 5"  (1.651 m)  Wt 216 lb (97.977 kg)  BMI 35.94 kg/m2  Assessment & Plan:  1. Essential hypertension Blood pressures not at goal. Will add drug from different class, beta blocker. Begin carvedilol 6.25 mg twice a day. Patient is in agreement with trying to get her blood pressure down. Will follow. May need to adjust dose of carvedilol - CMP14+EGFR - Lipid panel  Wardell Honour MD

## 2015-08-04 LAB — LIPID PANEL
CHOL/HDL RATIO: 5.2 ratio — AB (ref 0.0–4.4)
Cholesterol, Total: 176 mg/dL (ref 100–199)
HDL: 34 mg/dL — ABNORMAL LOW (ref >39)
LDL CALC: 120 mg/dL — AB (ref 0–99)
TRIGLYCERIDES: 110 mg/dL (ref 0–149)
VLDL Cholesterol Cal: 22 mg/dL (ref 5–40)

## 2015-08-04 LAB — CMP14+EGFR
A/G RATIO: 1.6 (ref 1.2–2.2)
ALBUMIN: 4.4 g/dL (ref 3.5–5.5)
ALT: 19 IU/L (ref 0–32)
AST: 13 IU/L (ref 0–40)
Alkaline Phosphatase: 101 IU/L (ref 39–117)
BUN/Creatinine Ratio: 23 (ref 9–23)
BUN: 22 mg/dL (ref 6–24)
Bilirubin Total: 0.2 mg/dL (ref 0.0–1.2)
CALCIUM: 9.6 mg/dL (ref 8.7–10.2)
CO2: 19 mmol/L (ref 18–29)
CREATININE: 0.95 mg/dL (ref 0.57–1.00)
Chloride: 104 mmol/L (ref 96–106)
GFR, EST AFRICAN AMERICAN: 81 mL/min/{1.73_m2} (ref >59)
GFR, EST NON AFRICAN AMERICAN: 71 mL/min/{1.73_m2} (ref >59)
GLOBULIN, TOTAL: 2.7 g/dL (ref 1.5–4.5)
Glucose: 127 mg/dL — ABNORMAL HIGH (ref 65–99)
Potassium: 4.1 mmol/L (ref 3.5–5.2)
SODIUM: 144 mmol/L (ref 134–144)
TOTAL PROTEIN: 7.1 g/dL (ref 6.0–8.5)

## 2015-08-22 ENCOUNTER — Other Ambulatory Visit: Payer: Self-pay | Admitting: Family Medicine

## 2015-08-26 IMAGING — MG MM DIGITAL SCREENING
4 series · 4 of 4 positions shown · non-contrast
Comparison: None.

CLINICAL DATA: Screening.

EXAM:
DIGITAL SCREENING BILATERAL MAMMOGRAM WITH CAD

[L CC]
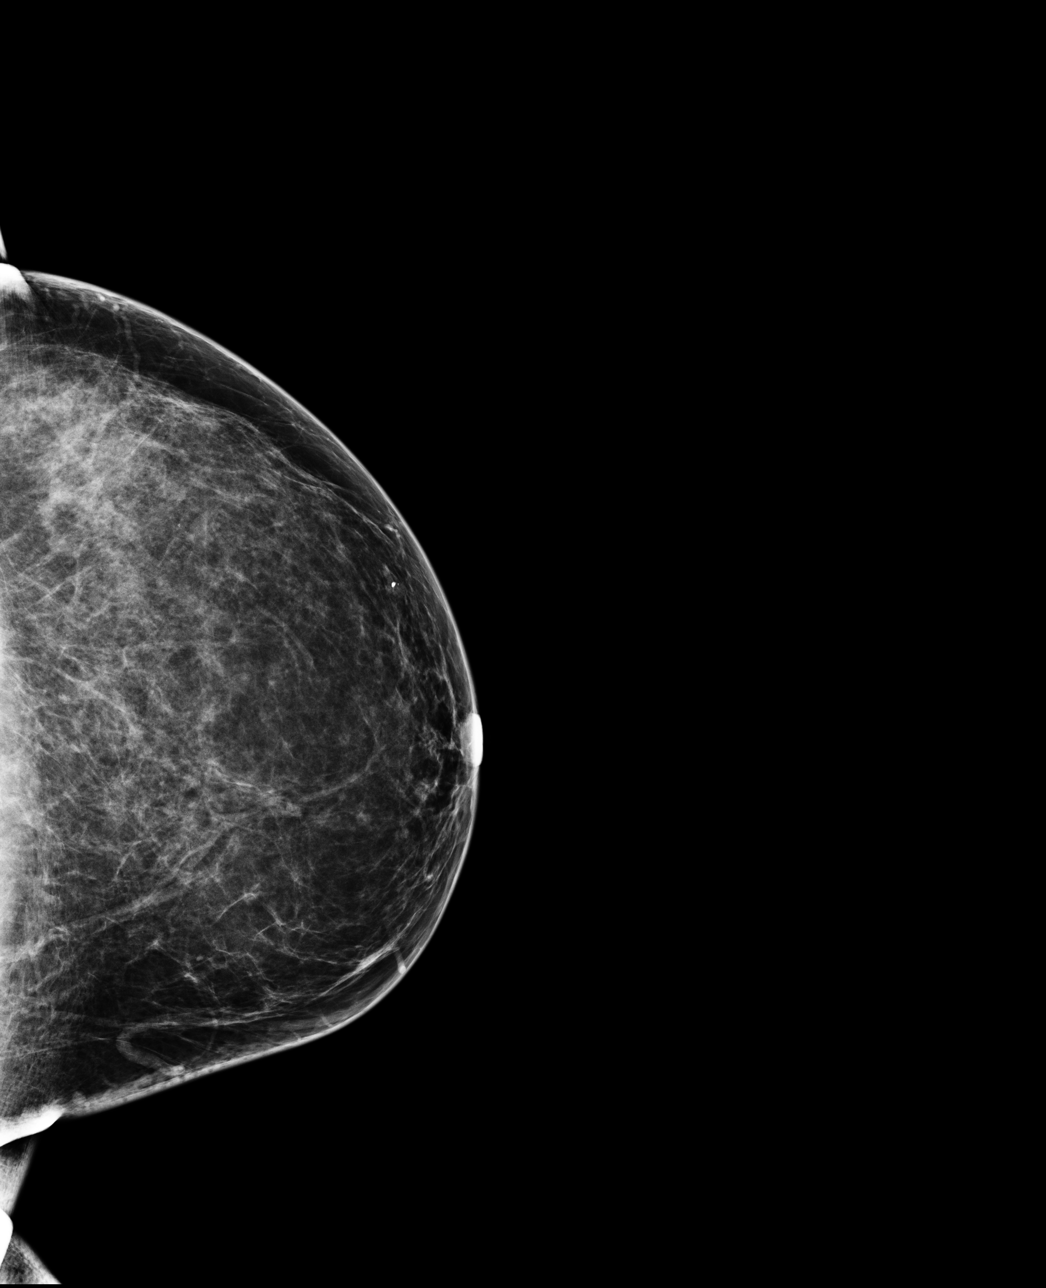

[L MLO]
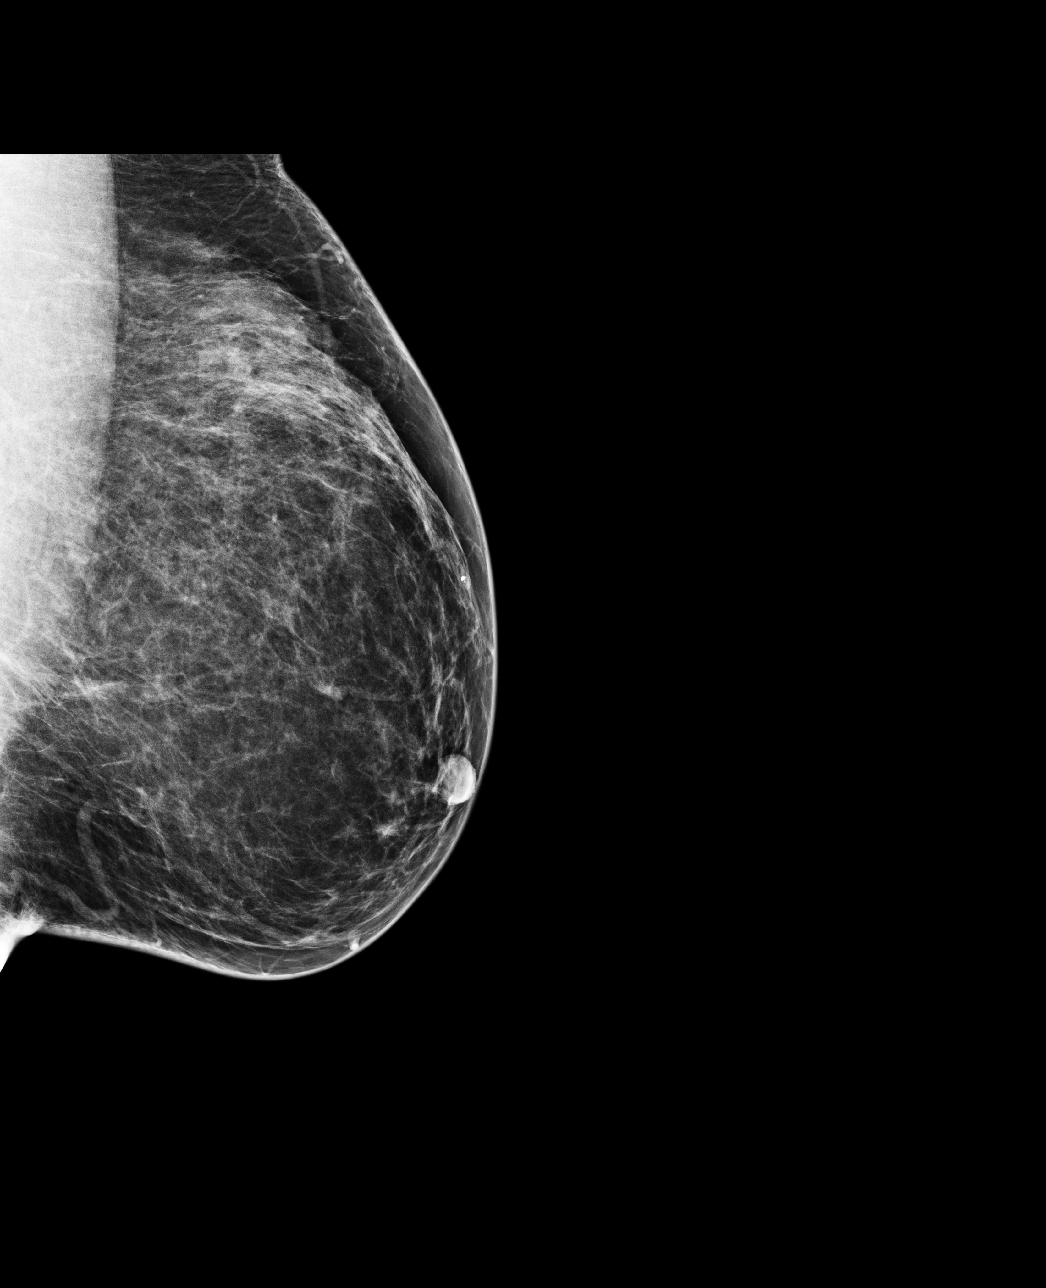

[R CC]
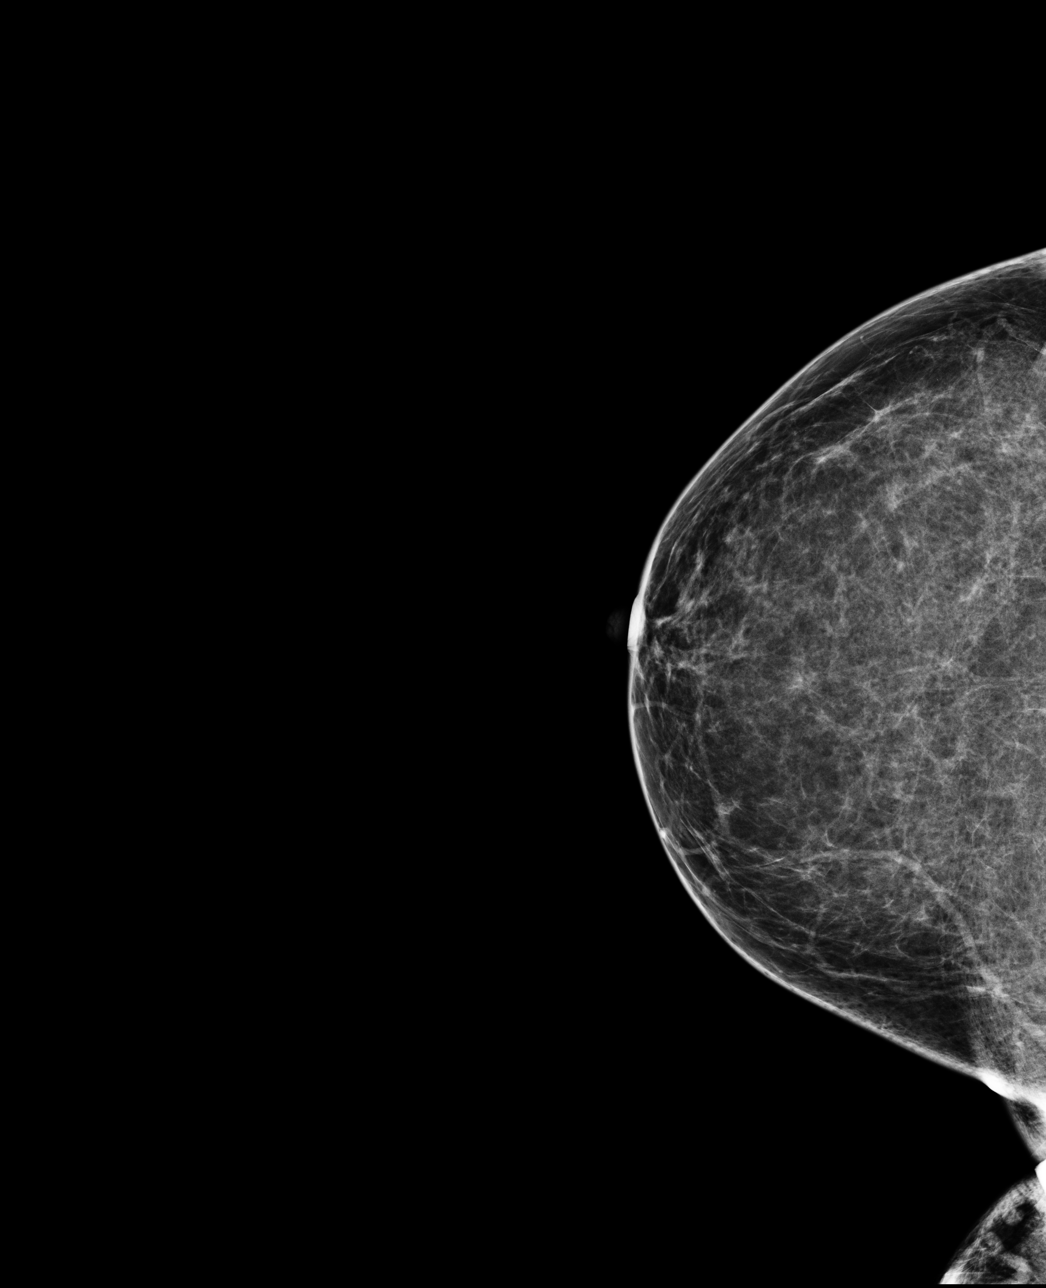

[R MLO]
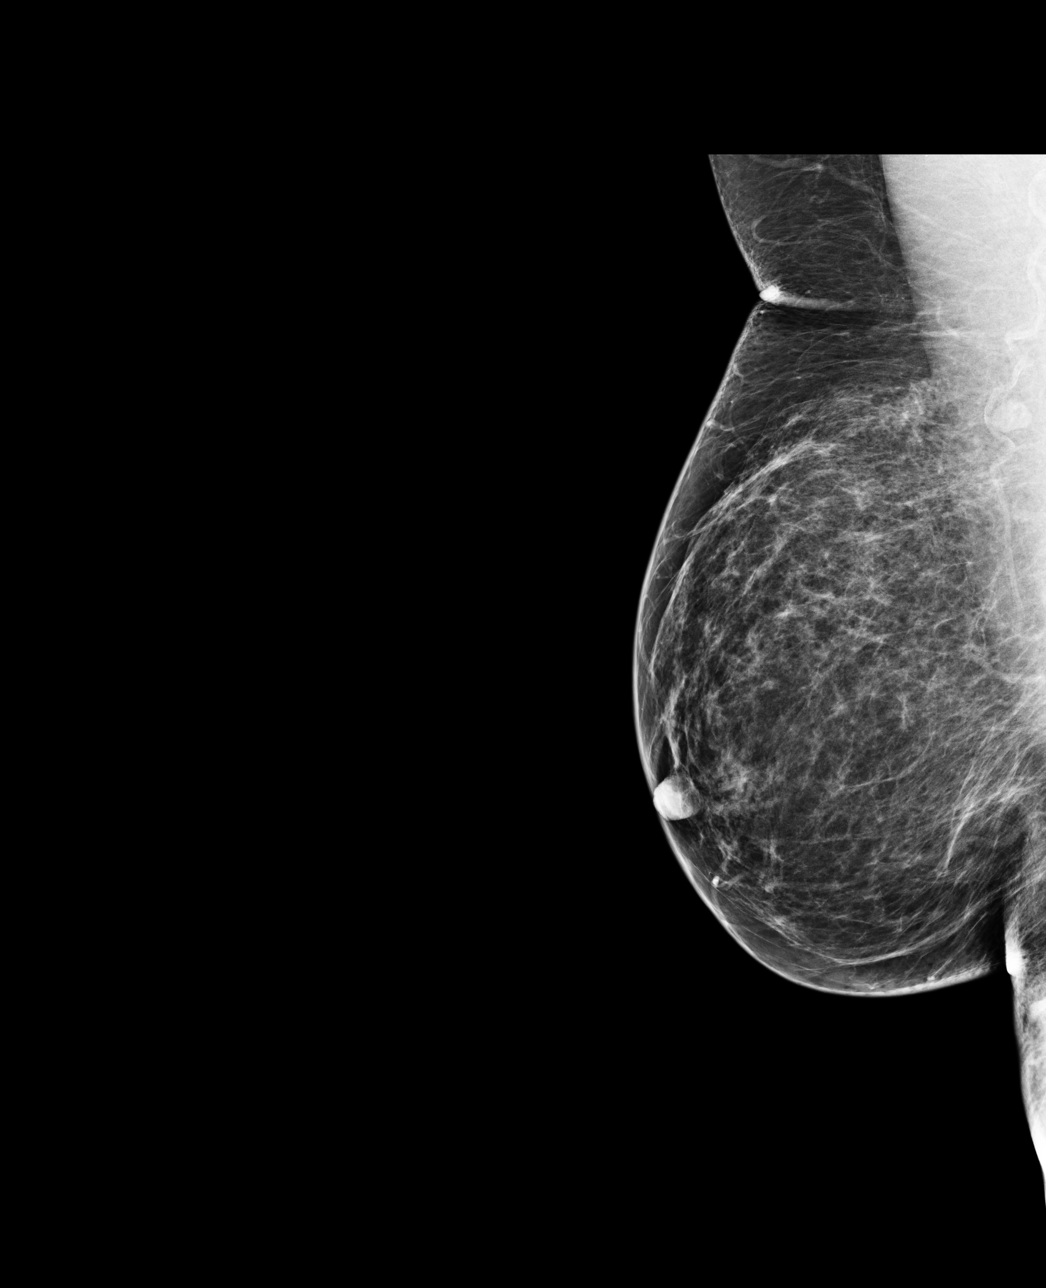

[4 of 4 positions shown; findings below may reference images not displayed]

ACR Breast Density Category b: There are scattered areas of
fibroglandular density.
FINDINGS: There are no findings suspicious for malignancy. Images were
processed with CAD.
IMPRESSION: No mammographic evidence of malignancy. A result letter of this
screening mammogram will be mailed directly to the patient.

RECOMMENDATION:
Screening mammogram in one year. (Code:[id number])

BI-RADS CATEGORY  1: Negative.

## 2015-10-04 ENCOUNTER — Other Ambulatory Visit: Payer: Self-pay | Admitting: Family Medicine

## 2015-10-26 ENCOUNTER — Other Ambulatory Visit: Payer: Self-pay | Admitting: Family Medicine

## 2015-11-01 ENCOUNTER — Other Ambulatory Visit: Payer: Self-pay | Admitting: Family Medicine

## 2015-12-10 ENCOUNTER — Other Ambulatory Visit: Payer: Self-pay | Admitting: Family Medicine

## 2016-02-29 ENCOUNTER — Other Ambulatory Visit: Payer: Self-pay | Admitting: Family Medicine

## 2016-03-06 ENCOUNTER — Other Ambulatory Visit: Payer: Self-pay | Admitting: Family Medicine

## 2016-03-10 ENCOUNTER — Other Ambulatory Visit: Payer: Self-pay | Admitting: Family Medicine

## 2016-03-10 NOTE — Telephone Encounter (Signed)
Authorize 30 days only. Then contact the patient letting them know that they will need an appointment before any further prescriptions can be sent in. 

## 2016-03-27 ENCOUNTER — Other Ambulatory Visit: Payer: Self-pay | Admitting: Family Medicine

## 2016-03-27 NOTE — Telephone Encounter (Signed)
Authorize 30 days only. Then contact the patient letting them know that they will need an appointment before any further prescriptions can be sent in. 

## 2016-03-27 NOTE — Telephone Encounter (Signed)
Pt aware will NTBS before next refill

## 2016-04-08 ENCOUNTER — Ambulatory Visit (INDEPENDENT_AMBULATORY_CARE_PROVIDER_SITE_OTHER): Payer: Self-pay | Admitting: Family Medicine

## 2016-04-08 ENCOUNTER — Encounter: Payer: Self-pay | Admitting: Family Medicine

## 2016-04-08 ENCOUNTER — Ambulatory Visit: Payer: Self-pay | Admitting: Family Medicine

## 2016-04-08 VITALS — BP 125/86 | HR 82 | Temp 98.4°F | Ht 65.0 in | Wt 224.0 lb

## 2016-04-08 DIAGNOSIS — M461 Sacroiliitis, not elsewhere classified: Secondary | ICD-10-CM

## 2016-04-08 DIAGNOSIS — E876 Hypokalemia: Secondary | ICD-10-CM

## 2016-04-08 DIAGNOSIS — Z6837 Body mass index (BMI) 37.0-37.9, adult: Secondary | ICD-10-CM

## 2016-04-08 DIAGNOSIS — I10 Essential (primary) hypertension: Secondary | ICD-10-CM

## 2016-04-08 DIAGNOSIS — E669 Obesity, unspecified: Secondary | ICD-10-CM

## 2016-04-08 MED ORDER — FUROSEMIDE 40 MG PO TABS: ORAL_TABLET | ORAL | 1 refills | Status: DC

## 2016-04-08 MED ORDER — VALSARTAN 320 MG PO TABS: 320.0000 mg | ORAL_TABLET | Freq: Every day | ORAL | 1 refills | Status: DC

## 2016-04-08 MED ORDER — MELOXICAM 15 MG PO TABS: ORAL_TABLET | ORAL | 1 refills | Status: DC

## 2016-04-08 MED ORDER — CARVEDILOL 6.25 MG PO TABS: ORAL_TABLET | ORAL | 1 refills | Status: DC

## 2016-04-08 MED ORDER — POTASSIUM CHLORIDE CRYS ER 20 MEQ PO TBCR: EXTENDED_RELEASE_TABLET | ORAL | 1 refills | Status: DC

## 2016-04-08 MED ORDER — AMLODIPINE BESYLATE 10 MG PO TABS: ORAL_TABLET | ORAL | 1 refills | Status: DC

## 2016-04-08 NOTE — Progress Notes (Signed)
Subjective:  Patient ID: Kelli Thompson, female    DOB: [birth year]  Age: 51 y.o. MRN: [id number]  CC: Hypertension (pt here today for routine follow up on HTN, medication refills and blood work.)   HPI Kelli Thompson presents for  follow-up of hypertension. Patient has no history of headache chest pain or shortness of breath or recent cough. Patient also denies symptoms of TIA such as numbness weakness lateralizing. Patient checks  blood pressure at home and has not had any elevated readings recently. Patient denies side effects from medication. States taking it regularly.   History Kelli Thompson has a past medical history of Anxiety; Hypertension; Pancreatitis; Peri-menopause (02/28/2014); Urinary frequency (01/30/2014); and Urinary incontinence, mixed (01/30/2014).   She has a past surgical history that includes Cesarean section; Cholecystectomy; Appendectomy; and Abdominal hysterectomy.   Her family history includes Alcohol abuse in her paternal grandfather; Arthritis in her maternal grandmother; Asthma in her father; Cancer in her paternal grandmother; Congestive Heart Failure in her maternal grandmother; Diabetes in her maternal grandmother and mother; Hypertension in her father and mother; Other in her son; Parkinson's disease in her maternal grandfather; Stroke in her father.She reports that she quit smoking about 3 years ago. Her smoking use included Cigarettes. She has a 15.00 pack-year smoking history. She has never used smokeless tobacco. She reports that she does not drink alcohol or use drugs.  Current Outpatient Prescriptions on File Prior to Visit  Medication Sig Dispense Refill  . famotidine (PEPCID) 20 MG tablet Take 1 tablet (20 mg total) by mouth 2 (two) times daily. 14 tablet 0  . Fish Oil-Cholecalciferol (FISH OIL + D3 PO) Take 1 capsule by mouth daily.    . Multiple Vitamin (MULTIVITAMIN WITH MINERALS) TABS tablet Take 1 tablet by mouth daily.    . [DISCONTINUED] mirabegron ER  (MYRBETRIQ) 25 MG TB24 tablet Take 1 tablet (25 mg total) by mouth daily. 30 tablet 11   No current facility-administered medications on file prior to visit.     ROS Review of Systems  Constitutional: Negative for activity change, appetite change and fever.  HENT: Negative for congestion, rhinorrhea and sore throat.   Eyes: Negative for visual disturbance.  Respiratory: Negative for cough and shortness of breath.   Cardiovascular: Negative for chest pain and palpitations.  Gastrointestinal: Negative for abdominal pain, diarrhea and nausea.  Genitourinary: Negative for dysuria.  Musculoskeletal: Positive for back pain (midline lumbar goes down both legs). Negative for arthralgias and myalgias.    Objective:  BP 125/86   Pulse 82   Temp 98.4 F (36.9 C) (Oral)   Ht 5' 5"  (1.651 m)   Wt 224 lb (101.6 kg)   BMI 37.28 kg/m   BP Readings from Last 3 Encounters:  04/08/16 125/86  08/03/15 (!) 134/94  08/01/15 151/95    Wt Readings from Last 3 Encounters:  04/08/16 224 lb (101.6 kg)  08/03/15 216 lb (98 kg)  08/01/15 209 lb (94.8 kg)     Physical Exam  Constitutional: She is oriented to person, place, and time. She appears well-developed and well-nourished. No distress.  HENT:  Head: Normocephalic and atraumatic.  Right Ear: External ear normal.  Left Ear: External ear normal.  Nose: Nose normal.  Mouth/Throat: Oropharynx is clear and moist.  Eyes: Conjunctivae and EOM are normal. Pupils are equal, round, and reactive to light.  Neck: Normal range of motion. Neck supple. No thyromegaly present.  Cardiovascular: Normal rate, regular rhythm and normal heart sounds.   No  murmur heard. Pulmonary/Chest: Effort normal and breath sounds normal. No respiratory distress. She has no wheezes. She has no rales.  Abdominal: Soft. Bowel sounds are normal. She exhibits no distension. There is no tenderness.  Lymphadenopathy:    She has no cervical adenopathy.  Neurological: She is  alert and oriented to person, place, and time. She has normal reflexes.  Skin: Skin is warm and dry.  Psychiatric: She has a normal mood and affect. Her behavior is normal. Judgment and thought content normal.     Lab Results  Component Value Date   WBC 12.4 (H) 04/22/2014   HGB 13.3 04/22/2014   HCT 39.5 04/22/2014   PLT 211 04/22/2014   GLUCOSE 95 04/08/2016   CHOL 176 08/03/2015   TRIG 110 08/03/2015   HDL 34 (L) 08/03/2015   LDLCALC 120 (H) 08/03/2015   ALT 19 04/08/2016   AST 17 04/08/2016   NA 145 (H) 04/08/2016   K 4.5 04/08/2016   CL 106 04/08/2016   CREATININE 0.79 04/08/2016   BUN 13 04/08/2016   CO2 25 04/08/2016    No results found.  Assessment & Plan:   Kelli Thompson was seen today for hypertension.  Diagnoses and all orders for this visit:  Essential hypertension -     CMP14+EGFR  Hypokalemia -     CMP14+EGFR  Class 2 obesity without serious comorbidity with body mass index (BMI) of 37.0 to 37.9 in adult, unspecified obesity type -     CMP14+EGFR  Sacroiliitis (HCC) -     CMP14+EGFR  Other orders -     amLODipine (NORVASC) 10 MG tablet; TAKE ONE TABLET BY MOUTH DAILY FOR BLOOD PRESSURE -     carvedilol (COREG) 6.25 MG tablet; TAKE 1 TABLET(6.25 MG) BY MOUTH TWICE DAILY WITH A MEAL -     furosemide (LASIX) 40 MG tablet; TAKE 1 TABLET BY MOUTH EVERY MORNING TO REDUCE SWELLING -     meloxicam (MOBIC) 15 MG tablet; TAKE 1 TABLET BY MOUTH DAILY FOR JOINT AND MUSCLE PAIN. -     potassium chloride SA (K-DUR,KLOR-CON) 20 MEQ tablet; TAKE 1 TABLET BY MOUTH DAILY FOR POTASSIUM -     valsartan (DIOVAN) 320 MG tablet; Take 1 tablet (320 mg total) by mouth daily.   I have discontinued Kelli Thompson's predniSONE and Cetirizine HCl. I have also changed her meloxicam and valsartan. Additionally, I am having her maintain her multivitamin with minerals, Fish Oil-Cholecalciferol (FISH OIL + D3 PO), famotidine, amLODipine, carvedilol, furosemide, and potassium chloride  SA.  Meds ordered this encounter  Medications  . amLODipine (NORVASC) 10 MG tablet    Sig: TAKE ONE TABLET BY MOUTH DAILY FOR BLOOD PRESSURE    Dispense:  90 tablet    Refill:  1  . carvedilol (COREG) 6.25 MG tablet    Sig: TAKE 1 TABLET(6.25 MG) BY MOUTH TWICE DAILY WITH A MEAL    Dispense:  180 tablet    Refill:  1  . furosemide (LASIX) 40 MG tablet    Sig: TAKE 1 TABLET BY MOUTH EVERY MORNING TO REDUCE SWELLING    Dispense:  90 tablet    Refill:  1  . meloxicam (MOBIC) 15 MG tablet    Sig: TAKE 1 TABLET BY MOUTH DAILY FOR JOINT AND MUSCLE PAIN.    Dispense:  90 tablet    Refill:  1    **Patient requests 90 days supply**  . potassium chloride SA (K-DUR,KLOR-CON) 20 MEQ tablet    Sig: TAKE 1  TABLET BY MOUTH DAILY FOR POTASSIUM    Dispense:  90 tablet    Refill:  1  . valsartan (DIOVAN) 320 MG tablet    Sig: Take 1 tablet (320 mg total) by mouth daily.    Dispense:  90 tablet    Refill:  1      Follow-up: Return in about 6 months (around 10/09/2016) for hypertension.  Claretta Fraise, M.D.

## 2016-04-08 NOTE — Patient Instructions (Signed)
Calorie Counting for Weight Loss Calories are units of energy. Your body needs a certain amount of calories from food to keep you going throughout the day. When you eat more calories than your body needs, your body stores the extra calories as fat. When you eat fewer calories than your body needs, your body burns fat to get the energy it needs. Calorie counting means keeping track of how many calories you eat and drink each day. Calorie counting can be helpful if you need to lose weight. If you make sure to eat fewer calories than your body needs, you should lose weight. Ask your health care provider what a healthy weight is for you. For calorie counting to work, you will need to eat the right number of calories in a day in order to lose a healthy amount of weight per week. A dietitian can help you determine how many calories you need in a day and will give you suggestions on how to reach your calorie goal.  A healthy amount of weight to lose per week is usually 1-2 lb (0.5-0.9 kg). This usually means that your daily calorie intake should be reduced by 500-750 calories.  Eating 1,200 - 1,500 calories per day can help most women lose weight.  Eating 1,500 - 1,800 calories per day can help most men lose weight. What is my plan? My goal is to have ____1200______ calories per day. If I have this many calories per day, I should lose around ____1-2______ pounds per week. What do I need to know about calorie counting? In order to meet your daily calorie goal, you will need to:  Find out how many calories are in each food you would like to eat. Try to do this before you eat.  Decide how much of the food you plan to eat.  Write down what you ate and how many calories it had. Doing this is called keeping a food log. To successfully lose weight, it is important to balance calorie counting with a healthy lifestyle that includes regular activity. Aim for 150 minutes of moderate exercise (such as walking) or  75 minutes of vigorous exercise (such as running) each week. Where do I find calorie information?   The number of calories in a food can be found on a Nutrition Facts label. If a food does not have a Nutrition Facts label, try to look up the calories online or ask your dietitian for help. Remember that calories are listed per serving. If you choose to have more than one serving of a food, you will have to multiply the calories per serving by the amount of servings you plan to eat. For example, the label on a package of bread might say that a serving size is 1 slice and that there are 90 calories in a serving. If you eat 1 slice, you will have eaten 90 calories. If you eat 2 slices, you will have eaten 180 calories. How do I keep a food log? Immediately after each meal, record the following information in your food log:  What you ate. Don't forget to include toppings, sauces, and other extras on the food.  How much you ate. This can be measured in cups, ounces, or number of items.  How many calories each food and drink had.  The total number of calories in the meal. Keep your food log near you, such as in a small notebook in your pocket, or use a mobile app or website. Some programs will  calculate calories for you and show you how many calories you have left for the day to meet your goal. What are some calorie counting tips?  Use your calories on foods and drinks that will fill you up and not leave you hungry:  Some examples of foods that fill you up are nuts and nut butters, vegetables, lean proteins, and high-fiber foods like whole grains. High-fiber foods are foods with more than 5 g fiber per serving.  Drinks such as sodas, specialty coffee drinks, alcohol, and juices have a lot of calories, yet do not fill you up.  Eat nutritious foods and avoid empty calories. Empty calories are calories you get from foods or beverages that do not have many vitamins or protein, such as candy, sweets, and  soda. It is better to have a nutritious high-calorie food (such as an avocado) than a food with few nutrients (such as a bag of chips).  Know how many calories are in the foods you eat most often. This will help you calculate calorie counts faster.  Pay attention to calories in drinks. Low-calorie drinks include water and unsweetened drinks.  Pay attention to nutrition labels for "low fat" or "fat free" foods. These foods sometimes have the same amount of calories or more calories than the full fat versions. They also often have added sugar, starch, or salt, to make up for flavor that was removed with the fat.  Find a way of tracking calories that works for you. Get creative. Try different apps or programs if writing down calories does not work for you. What are some portion control tips?  Know how many calories are in a serving. This will help you know how many servings of a certain food you can have.  Use a measuring cup to measure serving sizes. You could also try weighing out portions on a kitchen scale. With time, you will be able to estimate serving sizes for some foods.  Take some time to put servings of different foods on your favorite plates, bowls, and cups so you know what a serving looks like.  Try not to eat straight from a bag or box. Doing this can lead to overeating. Put the amount you would like to eat in a cup or on a plate to make sure you are eating the right portion.  Use smaller plates, glasses, and bowls to prevent overeating.  Try not to multitask (for example, watch TV or use your computer) while eating. If it is time to eat, sit down at a table and enjoy your food. This will help you to know when you are full. It will also help you to be aware of what you are eating and how much you are eating. What are tips for following this plan? Reading food labels   Check the calorie count compared to the serving size. The serving size may be smaller than what you are used to  eating.  Check the source of the calories. Make sure the food you are eating is high in vitamins and protein and low in saturated and trans fats. Shopping   Read nutrition labels while you shop. This will help you make healthy decisions before you decide to purchase your food.  Make a grocery list and stick to it. Cooking   Try to cook your favorite foods in a healthier way. For example, try baking instead of frying.  Use low-fat dairy products. Meal planning   Use more fruits and vegetables. Half of your   plate should be fruits and vegetables.  Include lean proteins like poultry and fish. How do I count calories when eating out?  Ask for smaller portion sizes.  Consider sharing an entree and sides instead of getting your own entree.  If you get your own entree, eat only half. Ask for a box at the beginning of your meal and put the rest of your entree in it so you are not tempted to eat it.  If calories are listed on the menu, choose the lower calorie options.  Choose dishes that include vegetables, fruits, whole grains, low-fat dairy products, and lean protein.  Choose items that are boiled, broiled, grilled, or steamed. Stay away from items that are buttered, battered, fried, or served with cream sauce. Items labeled "crispy" are usually fried, unless stated otherwise.  Choose water, low-fat milk, unsweetened iced tea, or other drinks without added sugar. If you want an alcoholic beverage, choose a lower calorie option such as a glass of wine or light beer.  Ask for dressings, sauces, and syrups on the side. These are usually high in calories, so you should limit the amount you eat.  If you want a salad, choose a garden salad and ask for grilled meats. Avoid extra toppings like bacon, cheese, or fried items. Ask for the dressing on the side, or ask for olive oil and vinegar or lemon to use as dressing.  Estimate how many servings of a food you are given. For example, a serving  of cooked rice is  cup or about the size of half a baseball. Knowing serving sizes will help you be aware of how much food you are eating at restaurants. The list below tells you how big or small some common portion sizes are based on everyday objects:  1 oz-4 stacked dice.  3 oz-1 deck of cards.  1 tsp-1 die.  1 Tbsp- a ping-pong ball.  2 Tbsp-1 ping-pong ball.   cup- baseball.  1 cup-1 baseball. Summary  Calorie counting means keeping track of how many calories you eat and drink each day. If you eat fewer calories than your body needs, you should lose weight.  A healthy amount of weight to lose per week is usually 1-2 lb (0.5-0.9 kg). This usually means reducing your daily calorie intake by 500-750 calories.  The number of calories in a food can be found on a Nutrition Facts label. If a food does not have a Nutrition Facts label, try to look up the calories online or ask your dietitian for help.  Use your calories on foods and drinks that will fill you up, and not on foods and drinks that will leave you hungry.  Use smaller plates, glasses, and bowls to prevent overeating. This information is not intended to replace advice given to you by your health care provider. Make sure you discuss any questions you have with your health care provider. Document Released: 01/13/2005 Document Revised: 12/14/2015 Document Reviewed: 12/14/2015 Elsevier Interactive Patient Education  2017 [place]. Back Exercises The following exercises strengthen the muscles that help to support the back. They also help to keep the lower back flexible. Doing these exercises can help to prevent back pain or lessen existing pain. If you have back pain or discomfort, try doing these exercises 2-3 times each day or as told by your health care provider. When the pain goes away, do them once each day, but increase the number of times that you repeat the steps for each exercise (do  more repetitions). If you do not  have back pain or discomfort, do these exercises once each day or as told by your health care provider. Exercises Single Knee to Chest   Repeat these steps 3-5 times for each leg: 1. Lie on your back on a firm bed or the floor with your legs extended. 2. Bring one knee to your chest. Your other leg should stay extended and in contact with the floor. 3. Hold your knee in place by grabbing your knee or thigh. 4. Pull on your knee until you feel a gentle stretch in your lower back. 5. Hold the stretch for 10-30 seconds. 6. Slowly release and straighten your leg. Pelvic Tilt   Repeat these steps 5-10 times: 1. Lie on your back on a firm bed or the floor with your legs extended. 2. Bend your knees so they are pointing toward the ceiling and your feet are flat on the floor. 3. Tighten your lower abdominal muscles to press your lower back against the floor. This motion will tilt your pelvis so your tailbone points up toward the ceiling instead of pointing to your feet or the floor. 4. With gentle tension and even breathing, hold this position for 5-10 seconds. Cat-Cow   Repeat these steps until your lower back becomes more flexible: 1. Get into a hands-and-knees position on a firm surface. Keep your hands under your shoulders, and keep your knees under your hips. You may place padding under your knees for comfort. 2. Let your head hang down, and point your tailbone toward the floor so your lower back becomes rounded like the back of a cat. 3. Hold this position for 5 seconds. 4. Slowly lift your head and point your tailbone up toward the ceiling so your back forms a sagging arch like the back of a cow. 5. Hold this position for 5 seconds. Press-Ups   Repeat these steps 5-10 times: 1. Lie on your abdomen (face-down) on the floor. 2. Place your palms near your head, about shoulder-width apart. 3. While you keep your back as relaxed as possible and keep your hips on the floor, slowly straighten  your arms to raise the top half of your body and lift your shoulders. Do not use your back muscles to raise your upper torso. You may adjust the placement of your hands to make yourself more comfortable. 4. Hold this position for 5 seconds while you keep your back relaxed. 5. Slowly return to lying flat on the floor. Bridges   Repeat these steps 10 times: 1. Lie on your back on a firm surface. 2. Bend your knees so they are pointing toward the ceiling and your feet are flat on the floor. 3. Tighten your buttocks muscles and lift your buttocks off of the floor until your waist is at almost the same height as your knees. You should feel the muscles working in your buttocks and the back of your thighs. If you do not feel these muscles, slide your feet 1-2 inches farther away from your buttocks. 4. Hold this position for 3-5 seconds. 5. Slowly lower your hips to the starting position, and allow your buttocks muscles to relax completely. If this exercise is too easy, try doing it with your arms crossed over your chest. Abdominal Crunches   Repeat these steps 5-10 times: 1. Lie on your back on a firm bed or the floor with your legs extended. 2. Bend your knees so they are pointing toward the ceiling and your feet  are flat on the floor. 3. Cross your arms over your chest. 4. Tip your chin slightly toward your chest without bending your neck. 5. Tighten your abdominal muscles and slowly raise your trunk (torso) high enough to lift your shoulder blades a tiny bit off of the floor. Avoid raising your torso higher than that, because it can put too much stress on your low back and it does not help to strengthen your abdominal muscles. 6. Slowly return to your starting position. Back Lifts  Repeat these steps 5-10 times: 1. Lie on your abdomen (face-down) with your arms at your sides, and rest your forehead on the floor. 2. Tighten the muscles in your legs and your buttocks. 3. Slowly lift your chest off  of the floor while you keep your hips pressed to the floor. Keep the back of your head in line with the curve in your back. Your eyes should be looking at the floor. 4. Hold this position for 3-5 seconds. 5. Slowly return to your starting position. Contact a health care provider if:  Your back pain or discomfort gets much worse when you do an exercise.  Your back pain or discomfort does not lessen within 2 hours after you exercise. If you have any of these problems, stop doing these exercises right away. Do not do them again unless your health care provider says that you can. Get help right away if:  You develop sudden, severe back pain. If this happens, stop doing the exercises right away. Do not do them again unless your health care provider says that you can. This information is not intended to replace advice given to you by your health care provider. Make sure you discuss any questions you have with your health care provider. Document Released: 02/21/2004 Document Revised: 05/23/2015 Document Reviewed: 03/09/2014 Elsevier Interactive Patient Education  2017 [employer]. Exercising to [employer] Exercising can help you to lose weight. In order to lose weight through exercise, you need to do vigorous-intensity exercise. You can tell that you are exercising with vigorous intensity if you are breathing very hard and fast and cannot hold a conversation while exercising. Moderate-intensity exercise helps to maintain your current weight. You can tell that you are exercising at a moderate level if you have a higher heart rate and faster breathing, but you are still able to hold a conversation. How often should I exercise? Choose an activity that you enjoy and set realistic goals. Your health care provider can help you to make an activity plan that works for you. Exercise regularly as directed by your health care provider. This may include:  Doing resistance training twice each week, such  as:  Push-ups.  Sit-ups.  Lifting weights.  Using resistance bands.  Doing a given intensity of exercise for a given amount of time. Choose from these options:  150 minutes of moderate-intensity exercise every week.  75 minutes of vigorous-intensity exercise every week.  A mix of moderate-intensity and vigorous-intensity exercise every week. Children, pregnant women, people who are out of shape, people who are overweight, and older adults may need to consult a health care provider for individual recommendations. If you have any sort of medical condition, be sure to consult your health care provider before starting a new exercise program. What are some activities that can help me to lose weight?  Walking at a rate of at least 4.5 miles an hour.  Jogging or running at a rate of 5 miles per hour.  Biking at  a rate of at least 10 miles per hour.  Lap swimming.  Roller-skating or in-line skating.  Cross-country skiing.  Vigorous competitive sports, such as football, basketball, and soccer.  Jumping rope.  Aerobic dancing. How can I be more active in my day-to-day activities?  Use the stairs instead of the elevator.  Take a walk during your lunch break.  If you drive, park your car farther away from work or school.  If you take public transportation, get off one stop early and walk the rest of the way.  Make all of your phone calls while standing up and walking around.  Get up, stretch, and walk around every 30 minutes throughout the day. What guidelines should I follow while exercising?  Do not exercise so much that you hurt yourself, feel dizzy, or get very short of breath.  Consult your health care provider prior to starting a new exercise program.  Wear comfortable clothes and shoes with good support.  Drink plenty of water while you exercise to prevent dehydration or heat stroke. Body water is lost during exercise and must be replaced.  Work out until you  breathe faster and your heart beats faster. This information is not intended to replace advice given to you by your health care provider. Make sure you discuss any questions you have with your health care provider. Document Released: 02/15/2010 Document Revised: 06/21/2015 Document Reviewed: 06/16/2013 Elsevier Interactive Patient Education  2017 [place]. Back Exercises The following exercises strengthen the muscles that help to support the back. They also help to keep the lower back flexible. Doing these exercises can help to prevent back pain or lessen existing pain. If you have back pain or discomfort, try doing these exercises 2-3 times each day or as told by your health care provider. When the pain goes away, do them once each day, but increase the number of times that you repeat the steps for each exercise (do more repetitions). If you do not have back pain or discomfort, do these exercises once each day or as told by your health care provider. Exercises Single Knee to Chest   Repeat these steps 3-5 times for each leg: 7. Lie on your back on a firm bed or the floor with your legs extended. 8. Bring one knee to your chest. Your other leg should stay extended and in contact with the floor. 9. Hold your knee in place by grabbing your knee or thigh. 10. Pull on your knee until you feel a gentle stretch in your lower back. 11. Hold the stretch for 10-30 seconds. 12. Slowly release and straighten your leg. Pelvic Tilt   Repeat these steps 5-10 times: 5. Lie on your back on a firm bed or the floor with your legs extended. 6. Bend your knees so they are pointing toward the ceiling and your feet are flat on the floor. 7. Tighten your lower abdominal muscles to press your lower back against the floor. This motion will tilt your pelvis so your tailbone points up toward the ceiling instead of pointing to your feet or the floor. 8. With gentle tension and even breathing, hold this position for  5-10 seconds. Cat-Cow   Repeat these steps until your lower back becomes more flexible: 6. Get into a hands-and-knees position on a firm surface. Keep your hands under your shoulders, and keep your knees under your hips. You may place padding under your knees for comfort. 7. Let your head hang down, and point your tailbone toward  the floor so your lower back becomes rounded like the back of a cat. 8. Hold this position for 5 seconds. 9. Slowly lift your head and point your tailbone up toward the ceiling so your back forms a sagging arch like the back of a cow. 10. Hold this position for 5 seconds. Press-Ups   Repeat these steps 5-10 times: 6. Lie on your abdomen (face-down) on the floor. 7. Place your palms near your head, about shoulder-width apart. 8. While you keep your back as relaxed as possible and keep your hips on the floor, slowly straighten your arms to raise the top half of your body and lift your shoulders. Do not use your back muscles to raise your upper torso. You may adjust the placement of your hands to make yourself more comfortable. 9. Hold this position for 5 seconds while you keep your back relaxed. 10. Slowly return to lying flat on the floor. Bridges   Repeat these steps 10 times: 6. Lie on your back on a firm surface. 7. Bend your knees so they are pointing toward the ceiling and your feet are flat on the floor. 8. Tighten your buttocks muscles and lift your buttocks off of the floor until your waist is at almost the same height as your knees. You should feel the muscles working in your buttocks and the back of your thighs. If you do not feel these muscles, slide your feet 1-2 inches farther away from your buttocks. 9. Hold this position for 3-5 seconds. 10. Slowly lower your hips to the starting position, and allow your buttocks muscles to relax completely. If this exercise is too easy, try doing it with your arms crossed over your chest. Abdominal Crunches    Repeat these steps 5-10 times: 7. Lie on your back on a firm bed or the floor with your legs extended. 8. Bend your knees so they are pointing toward the ceiling and your feet are flat on the floor. 9. Cross your arms over your chest. 10. Tip your chin slightly toward your chest without bending your neck. 45. Tighten your abdominal muscles and slowly raise your trunk (torso) high enough to lift your shoulder blades a tiny bit off of the floor. Avoid raising your torso higher than that, because it can put too much stress on your low back and it does not help to strengthen your abdominal muscles. 12. Slowly return to your starting position. Back Lifts  Repeat these steps 5-10 times: 6. Lie on your abdomen (face-down) with your arms at your sides, and rest your forehead on the floor. 7. Tighten the muscles in your legs and your buttocks. 8. Slowly lift your chest off of the floor while you keep your hips pressed to the floor. Keep the back of your head in line with the curve in your back. Your eyes should be looking at the floor. 9. Hold this position for 3-5 seconds. 10. Slowly return to your starting position. Contact a health care provider if:  Your back pain or discomfort gets much worse when you do an exercise.  Your back pain or discomfort does not lessen within 2 hours after you exercise. If you have any of these problems, stop doing these exercises right away. Do not do them again unless your health care provider says that you can. Get help right away if:  You develop sudden, severe back pain. If this happens, stop doing the exercises right away. Do not do them again unless your health care  provider says that you can. This information is not intended to replace advice given to you by your health care provider. Make sure you discuss any questions you have with your health care provider. Document Released: 02/21/2004 Document Revised: 05/23/2015 Document Reviewed: 03/09/2014 Elsevier  Interactive Patient Education  2017 [employer].

## 2016-04-09 ENCOUNTER — Telehealth: Payer: Self-pay | Admitting: Family Medicine

## 2016-04-09 LAB — CMP14+EGFR
ALT: 19 IU/L (ref 0–32)
AST: 17 IU/L (ref 0–40)
Albumin/Globulin Ratio: 1.7 (ref 1.2–2.2)
Albumin: 4.3 g/dL (ref 3.5–5.5)
Alkaline Phosphatase: 88 IU/L (ref 39–117)
BUN/Creatinine Ratio: 16 (ref 9–23)
BUN: 13 mg/dL (ref 6–24)
Bilirubin Total: 0.2 mg/dL (ref 0.0–1.2)
CALCIUM: 9.7 mg/dL (ref 8.7–10.2)
CO2: 25 mmol/L (ref 18–29)
CREATININE: 0.79 mg/dL (ref 0.57–1.00)
Chloride: 106 mmol/L (ref 96–106)
GFR calc Af Amer: 101 mL/min/{1.73_m2} (ref >59)
GFR, EST NON AFRICAN AMERICAN: 88 mL/min/{1.73_m2} (ref >59)
GLOBULIN, TOTAL: 2.5 g/dL (ref 1.5–4.5)
GLUCOSE: 95 mg/dL (ref 65–99)
Potassium: 4.5 mmol/L (ref 3.5–5.2)
Sodium: 145 mmol/L — ABNORMAL HIGH (ref 134–144)
Total Protein: 6.8 g/dL (ref 6.0–8.5)

## 2016-04-10 NOTE — Telephone Encounter (Signed)
Pt aware of test results

## 2016-09-10 ENCOUNTER — Telehealth: Payer: Self-pay | Admitting: Family Medicine

## 2016-09-10 ENCOUNTER — Other Ambulatory Visit: Payer: Self-pay | Admitting: Family Medicine

## 2016-09-10 MED ORDER — OLMESARTAN MEDOXOMIL 40 MG PO TABS: 40.0000 mg | ORAL_TABLET | Freq: Every day | ORAL | 1 refills | Status: DC

## 2016-09-10 NOTE — Telephone Encounter (Signed)
Pt notified of RX change Verbalizes understanding 

## 2016-09-10 NOTE — Telephone Encounter (Signed)
Please contact the patient . I sent in Benicar to replace the valsartan. She can take the first Benicar after her last valsartan with a simple replacement substitution. That is no titration or taper necessary

## 2016-10-16 ENCOUNTER — Other Ambulatory Visit: Payer: Self-pay | Admitting: Family Medicine

## 2016-10-22 ENCOUNTER — Other Ambulatory Visit: Payer: Self-pay | Admitting: Family Medicine

## 2016-10-22 MED ORDER — POTASSIUM CHLORIDE CRYS ER 20 MEQ PO TBCR: EXTENDED_RELEASE_TABLET | ORAL | 0 refills | Status: DC

## 2016-10-22 NOTE — Telephone Encounter (Signed)
Last seen 04/08/16  Dr [employer]

## 2016-10-22 NOTE — Telephone Encounter (Signed)
Last seen 3/18 [facility]

## 2017-01-18 ENCOUNTER — Other Ambulatory Visit: Payer: Self-pay | Admitting: Family Medicine

## 2017-01-21 ENCOUNTER — Other Ambulatory Visit: Payer: Self-pay | Admitting: Family Medicine

## 2017-01-21 NOTE — Telephone Encounter (Signed)
Patient aware.

## 2017-01-21 NOTE — Telephone Encounter (Signed)
Authorize 30 days only. Then contact the patient letting them know that they will need an appointment before any further prescriptions can be sent in. 

## 2017-01-21 NOTE — Telephone Encounter (Signed)
Last seen 04/08/16  Dr Livia Snellen

## 2017-01-21 NOTE — Telephone Encounter (Signed)
Last seen 04/08/16 Dr Livia Snellen

## 2017-01-29 ENCOUNTER — Other Ambulatory Visit: Payer: Self-pay | Admitting: Family Medicine

## 2017-01-29 MED ORDER — AMLODIPINE BESYLATE 10 MG PO TABS: ORAL_TABLET | ORAL | 0 refills | Status: DC

## 2017-01-29 MED ORDER — CARVEDILOL 6.25 MG PO TABS: ORAL_TABLET | ORAL | 0 refills | Status: DC

## 2017-01-29 NOTE — Telephone Encounter (Signed)
What is the name of the medication? Amlodipine 10 mg and Carvedilol 6.25 Patient has appt 02-11-17 @ 7:55  Have you contacted your pharmacy to request a refill? NO  [facility]   Patient notified that their request is being sent to the clinical staff for review and that they should receive a call once it is complete. If they do not receive a call within 24 hours they can check with their pharmacy or our office.

## 2017-01-29 NOTE — Telephone Encounter (Signed)
#  [phone] sent to [facility] On 02/11/17

## 2017-01-30 ENCOUNTER — Other Ambulatory Visit: Payer: Self-pay | Admitting: Family Medicine

## 2017-02-03 ENCOUNTER — Other Ambulatory Visit: Payer: Self-pay | Admitting: Family Medicine

## 2017-02-04 NOTE — Telephone Encounter (Signed)
Patient last seen in March 2018. Please advise on refill

## 2017-02-11 ENCOUNTER — Ambulatory Visit (INDEPENDENT_AMBULATORY_CARE_PROVIDER_SITE_OTHER): Payer: Self-pay | Admitting: Family Medicine

## 2017-02-11 ENCOUNTER — Encounter: Payer: Self-pay | Admitting: Family Medicine

## 2017-02-11 ENCOUNTER — Other Ambulatory Visit: Payer: Self-pay

## 2017-02-11 VITALS — BP 134/91 | HR 91 | Temp 97.5°F | Ht 65.0 in | Wt 227.0 lb

## 2017-02-11 DIAGNOSIS — H6505 Acute serous otitis media, recurrent, left ear: Secondary | ICD-10-CM

## 2017-02-11 DIAGNOSIS — Z23 Encounter for immunization: Secondary | ICD-10-CM

## 2017-02-11 DIAGNOSIS — R7309 Other abnormal glucose: Secondary | ICD-10-CM

## 2017-02-11 DIAGNOSIS — I10 Essential (primary) hypertension: Secondary | ICD-10-CM

## 2017-02-11 MED ORDER — AMLODIPINE BESYLATE 10 MG PO TABS: ORAL_TABLET | ORAL | 1 refills | Status: DC

## 2017-02-11 MED ORDER — CARVEDILOL 6.25 MG PO TABS: ORAL_TABLET | ORAL | 1 refills | Status: DC

## 2017-02-11 MED ORDER — POTASSIUM CHLORIDE CRYS ER 20 MEQ PO TBCR: EXTENDED_RELEASE_TABLET | ORAL | 1 refills | Status: DC

## 2017-02-11 MED ORDER — FAMOTIDINE 20 MG PO TABS: 20.0000 mg | ORAL_TABLET | Freq: Two times a day (BID) | ORAL | 1 refills | Status: DC

## 2017-02-11 MED ORDER — OLMESARTAN MEDOXOMIL 40 MG PO TABS: 40.0000 mg | ORAL_TABLET | Freq: Every day | ORAL | 1 refills | Status: DC

## 2017-02-11 MED ORDER — MELOXICAM 15 MG PO TABS: ORAL_TABLET | ORAL | 1 refills | Status: DC

## 2017-02-11 MED ORDER — AMOXICILLIN-POT CLAVULANATE 875-125 MG PO TABS: 1.0000 | ORAL_TABLET | Freq: Two times a day (BID) | ORAL | 0 refills | Status: DC

## 2017-02-11 MED ORDER — FUROSEMIDE 40 MG PO TABS: ORAL_TABLET | ORAL | 1 refills | Status: DC

## 2017-02-11 NOTE — Progress Notes (Signed)
Subjective:  Patient ID: Kelli Thompson, female    DOB: 1965/04/30  Age: 52 y.o. MRN: [id number]  CC: Follow up chronic medical problems (Left ear hurting)   HPI Kelli Thompson presents for  follow-up of hypertension. Patient has no history of headache chest pain or shortness of breath or recent cough. Patient also denies symptoms of TIA such as focal numbness or weakness. Patient checks  blood pressure at home. Readings recently good. Patient denies side effects from medication. States taking it regularly.   History Kelli Thompson has a past medical history of Anxiety, Hypertension, Pancreatitis, Peri-menopause (02/28/2014), Urinary frequency (01/30/2014), and Urinary incontinence, mixed (01/30/2014).   Kelli Thompson has a past surgical history that includes Cesarean section; Cholecystectomy; Appendectomy; and Abdominal hysterectomy.   Kelli Thompson family history includes Alcohol abuse in Kelli Thompson paternal grandfather; Arthritis in Kelli Thompson maternal grandmother; Asthma in Kelli Thompson father; Cancer in Kelli Thompson paternal grandmother; Congestive Heart Failure in Kelli Thompson maternal grandmother; Diabetes in Kelli Thompson maternal grandmother and mother; Hypertension in Kelli Thompson father and mother; Other in Kelli Thompson son; Parkinson's disease in Kelli Thompson maternal grandfather; Stroke in Kelli Thompson father.Kelli Thompson reports that Kelli Thompson quit smoking about 3 years ago. Kelli Thompson smoking use included cigarettes. Kelli Thompson has a 15.00 pack-year smoking history. Kelli Thompson has never used smokeless tobacco. Kelli Thompson reports that Kelli Thompson does not drink alcohol or use drugs.  Current Outpatient Medications on File Prior to Visit  Medication Sig Dispense Refill  . Fish Oil-Cholecalciferol (FISH OIL + D3 PO) Take 1 capsule by mouth daily.    . Multiple Vitamin (MULTIVITAMIN WITH MINERALS) TABS tablet Take 1 tablet by mouth daily.    . [DISCONTINUED] mirabegron ER (MYRBETRIQ) 25 MG TB24 tablet Take 1 tablet (25 mg total) by mouth daily. 30 tablet 11   No current facility-administered medications on file prior to visit.     ROS Review  of Systems  HENT: Positive for ear pain (Left ear hurts for the last 3-4 days pain is increasing.  Kelli Thompson gets an infection about once a year.  No fever chills sweats or other respiratory s symptoms).     Objective:  BP (!) 134/91   Pulse 91   Temp (!) 97.5 F (36.4 C) (Oral)   Ht 5' 5"  (1.651 m)   Wt 227 lb (103 kg)   BMI 37.77 kg/m   BP Readings from Last 3 Encounters:  02/11/17 (!) 134/91  04/08/16 125/86  08/03/15 (!) 134/94    Wt Readings from Last 3 Encounters:  02/11/17 227 lb (103 kg)  04/08/16 224 lb (101.6 kg)  08/03/15 216 lb (98 kg)     Physical Exam  Constitutional: Kelli Thompson is oriented to person, place, and time. Kelli Thompson appears well-developed and well-nourished. No distress.  HENT:  Head: Normocephalic and atraumatic.  Left Ear: Ear canal normal. Tympanic membrane is injected and erythematous. A middle ear effusion is present.  Eyes: Conjunctivae are normal. Pupils are equal, round, and reactive to light.  Neck: Normal range of motion. Neck supple. No thyromegaly present.  Cardiovascular: Normal rate, regular rhythm and normal heart sounds.  No murmur heard. Pulmonary/Chest: Effort normal and breath sounds normal. No respiratory distress. Kelli Thompson has no wheezes. Kelli Thompson has no rales.  Abdominal: Soft. Bowel sounds are normal. Kelli Thompson exhibits no distension. There is no tenderness.  Musculoskeletal: Normal range of motion.  Lymphadenopathy:    Kelli Thompson has no cervical adenopathy.  Neurological: Kelli Thompson is alert and oriented to person, place, and time.  Skin: Skin is warm and dry.  Psychiatric: Kelli Thompson has a normal  mood and affect. Kelli Thompson behavior is normal. Judgment and thought content normal.      Assessment & Plan:   Kelli Thompson was seen today for follow up chronic medical problems.  Diagnoses and all orders for this visit:  Essential hypertension -     CMP14+EGFR  Need for immunization against influenza -     Flu Vaccine QUAD 36+ mos IM  Recurrent acute serous otitis media of left  ear  Other orders -     amoxicillin-clavulanate (AUGMENTIN) 875-125 MG tablet; Take 1 tablet by mouth 2 (two) times daily.   Allergies as of 02/11/2017      Reactions   Ace Inhibitors    pancreatitis      Medication List        Accurate as of 02/11/17  9:50 AM. Always use your most recent med list.          amLODipine 10 MG tablet Commonly known as:  NORVASC TAKE ONE TABLET BY MOUTH DAILY FOR BLOOD PRESSURE   amoxicillin-clavulanate 875-125 MG tablet Commonly known as:  AUGMENTIN Take 1 tablet by mouth 2 (two) times daily.   carvedilol 6.25 MG tablet Commonly known as:  COREG Take one tablet twice daily with a meal   famotidine 20 MG tablet Commonly known as:  PEPCID Take 1 tablet (20 mg total) by mouth 2 (two) times daily.   FISH OIL + D3 PO Take 1 capsule by mouth daily.   furosemide 40 MG tablet Commonly known as:  LASIX TAKE ONE TABLET BY MOUTH EVERY MORNING TO REDUCE SWELLING **MUST CALL MD FOR APPOINTMENT   meloxicam 15 MG tablet Commonly known as:  MOBIC TAKE ONE TABLET BY MOUTH DAILY FOR JOINT AND MUSCLE PAIN **MUST CALL MD FOR APPOINTMENT   multivitamin with minerals Tabs tablet Take 1 tablet by mouth daily.   olmesartan 40 MG tablet Commonly known as:  BENICAR Take 1 tablet (40 mg total) by mouth daily. For blood pressure   potassium chloride SA 20 MEQ tablet Commonly known as:  KLOR-CON M20 TAKE 1 TABLET BY MOUTH DAILY FOR POTASSIUM       Meds ordered this encounter  Medications  . amoxicillin-clavulanate (AUGMENTIN) 875-125 MG tablet    Sig: Take 1 tablet by mouth 2 (two) times daily.    Dispense:  20 tablet    Refill:  0     Follow-up: Return in about 6 months (around 08/11/2017), or if symptoms worsen or fail to improve.  Claretta Fraise, M.D.

## 2017-02-12 LAB — CMP14+EGFR
A/G RATIO: 1.7 (ref 1.2–2.2)
ALT: 23 IU/L (ref 0–32)
AST: 18 IU/L (ref 0–40)
Albumin: 4.8 g/dL (ref 3.5–5.5)
Alkaline Phosphatase: 89 IU/L (ref 39–117)
BILIRUBIN TOTAL: 0.4 mg/dL (ref 0.0–1.2)
BUN/Creatinine Ratio: 14 (ref 9–23)
BUN: 15 mg/dL (ref 6–24)
CALCIUM: 10.3 mg/dL — AB (ref 8.7–10.2)
CHLORIDE: 102 mmol/L (ref 96–106)
CO2: 25 mmol/L (ref 20–29)
Creatinine, Ser: 1.05 mg/dL — ABNORMAL HIGH (ref 0.57–1.00)
GFR calc Af Amer: 71 mL/min/{1.73_m2} (ref >59)
GFR, EST NON AFRICAN AMERICAN: 62 mL/min/{1.73_m2} (ref >59)
GLOBULIN, TOTAL: 2.8 g/dL (ref 1.5–4.5)
Glucose: 116 mg/dL — ABNORMAL HIGH (ref 65–99)
POTASSIUM: 4.4 mmol/L (ref 3.5–5.2)
SODIUM: 144 mmol/L (ref 134–144)
Total Protein: 7.6 g/dL (ref 6.0–8.5)

## 2017-02-16 ENCOUNTER — Other Ambulatory Visit: Payer: Self-pay | Admitting: Family Medicine

## 2017-02-16 LAB — BAYER DCA HB A1C WAIVED: HB A1C: 5.2 % (ref <7.0)

## 2017-02-16 NOTE — Addendum Note (Signed)
Addended by: Wardell Heath on: 02/16/2017 10:00 AM   Modules accepted: Orders

## 2017-02-19 ENCOUNTER — Other Ambulatory Visit: Payer: Self-pay | Admitting: Family Medicine

## 2017-04-27 ENCOUNTER — Other Ambulatory Visit: Payer: Self-pay | Admitting: Family Medicine

## 2017-05-30 ENCOUNTER — Encounter (HOSPITAL_COMMUNITY): Payer: Self-pay | Admitting: Emergency Medicine

## 2017-05-30 ENCOUNTER — Other Ambulatory Visit: Payer: Self-pay

## 2017-05-30 ENCOUNTER — Emergency Department (HOSPITAL_COMMUNITY)
Admission: EM | Admit: 2017-05-30 | Discharge: 2017-05-31 | Disposition: A | Discharge status: Home / Self Care | Payer: BLUE CROSS/BLUE SHIELD | Attending: Emergency Medicine | Admitting: Emergency Medicine

## 2017-05-30 DIAGNOSIS — I10 Essential (primary) hypertension: Secondary | ICD-10-CM | POA: Insufficient documentation

## 2017-05-30 DIAGNOSIS — Z87891 Personal history of nicotine dependence: Secondary | ICD-10-CM | POA: Insufficient documentation

## 2017-05-30 DIAGNOSIS — L509 Urticaria, unspecified: Secondary | ICD-10-CM | POA: Insufficient documentation

## 2017-05-30 DIAGNOSIS — Z79899 Other long term (current) drug therapy: Secondary | ICD-10-CM | POA: Insufficient documentation

## 2017-05-30 NOTE — ED Triage Notes (Signed)
Patient with rash to her upper back that started yesterday, up into her hairline now.

## 2017-05-31 ENCOUNTER — Other Ambulatory Visit: Payer: Self-pay

## 2017-05-31 MED ORDER — PREDNISONE 20 MG PO TABS
40.0000 mg | ORAL_TABLET | Freq: Once | ORAL | Status: AC
  Administered 2017-05-31: 40 mg via ORAL
  Filled 2017-05-31: qty 2

## 2017-05-31 MED ORDER — FAMOTIDINE 20 MG PO TABS
20.0000 mg | ORAL_TABLET | Freq: Once | ORAL | Status: AC
  Administered 2017-05-31: 20 mg via ORAL
  Filled 2017-05-31: qty 1

## 2017-05-31 MED ORDER — HYDROXYZINE HCL 25 MG PO TABS
25.0000 mg | ORAL_TABLET | Freq: Once | ORAL | Status: AC
  Administered 2017-05-31: 25 mg via ORAL
  Filled 2017-05-31: qty 1

## 2017-05-31 MED ORDER — DEXAMETHASONE 4 MG PO TABS: 4.0000 mg | ORAL_TABLET | Freq: Two times a day (BID) | ORAL | 0 refills | Status: DC

## 2017-05-31 NOTE — Discharge Instructions (Addendum)
Please use Allegra during the morning.  Please use Benadryl at bedtime, or every 6 hours if needed for severe itching.  Please use 20 mg of Pepcid each morning.  Please use Decadron 2 times daily with food.  After the itching and rash has resolved, please see Dr. Ishmael Holter for allergy testing.  Please return to the emergency department if any swelling of the mouth or face.  Difficulty with breathing or swallowing, or changes in your general condition.

## 2017-05-31 NOTE — ED Provider Notes (Signed)
[facility] EMERGENCY DEPARTMENT Provider Note   CSN: [id number] Arrival date & time: 05/30/17  2304     History   Chief Complaint Chief Complaint  Patient presents with  . Rash    HPI Kelli Thompson is a 52 y.o. female.  Patient is a 52 year old female who presents to the emergency department with complaint of rash.  The patient states that this problem started on yesterday May 3.  The patient states that she had a salad with heart telemetry unit in part [place] dressing she also had some grilled chicken.  It was after this that she began to have some itching at her inner left arm.  Following this she had hives and red areas on her back on her arms and a few on her chest.  She did not have any difficulty with breathing.  She tried baking soda salt tub soaks, and a cool shower, but these did not help either.  The patient then was gradually able to go back to sleep, but was awakened approximately 3 AM again with itching and hives.  The problem is not resolved, and the patient comes in tonight for evaluation of her rash.     Past Medical History:  Diagnosis Date  . Anxiety   . Hypertension   . Pancreatitis   . Peri-menopause 02/28/2014  . Urinary frequency 01/30/2014  . Urinary incontinence, mixed 01/30/2014    Patient Active Problem List   Diagnosis Date Noted  . Localized edema 04/30/2015  . Peri-menopause 02/28/2014  . Urinary incontinence, mixed 01/30/2014  . Hypokalemia 04/18/2013  . HTN (hypertension) 04/17/2013    Past Surgical History:  Procedure Laterality Date  . ABDOMINAL HYSTERECTOMY    . APPENDECTOMY    . CESAREAN SECTION     x2  . CHOLECYSTECTOMY       OB History    Gravida  3   Para  3   Term  3   Preterm      AB      Living  3     SAB      TAB      Ectopic      Multiple      Live Births               Home Medications    Prior to Admission medications   Medication Sig Start Date End Date Taking? Authorizing Provider    amLODipine (NORVASC) 10 MG tablet TAKE ONE TABLET BY MOUTH DAILY FOR BLOOD PRESSURE 02/11/17   Claretta Fraise, MD  amLODipine (NORVASC) 10 MG tablet TAKE ONE TABLET BY MOUTH DAILY FOR BLOOD PRESSURE 02/16/17   Claretta Fraise, MD  amoxicillin-clavulanate (AUGMENTIN) 875-125 MG tablet Take 1 tablet by mouth 2 (two) times daily. 02/11/17   Claretta Fraise, MD  carvedilol (COREG) 6.25 MG tablet Take one tablet twice daily with a meal 02/11/17   Claretta Fraise, MD  carvedilol (COREG) 6.25 MG tablet TAKE 1 TABLET BY MOUTH TWO TIMES A DAY WITH MEALS 02/16/17   Claretta Fraise, MD  famotidine (PEPCID) 20 MG tablet Take 1 tablet (20 mg total) by mouth 2 (two) times daily. 02/11/17   Claretta Fraise, MD  Fish Oil-Cholecalciferol (FISH OIL + D3 PO) Take 1 capsule by mouth daily.    [provider]  furosemide (LASIX) 40 MG tablet TAKE ONE TABLET BY MOUTH EVERY MORNING TO REDUCE SWELLING **MUST CALL MD FOR APPOINTMENT 02/11/17   Claretta Fraise, MD  meloxicam (MOBIC) 15 MG tablet TAKE  ONE TABLET BY MOUTH DAILY FOR JOINT AND MUSCLE PAIN--MUST CALL MD FOR APPOINTMENT 04/28/17   Claretta Fraise, MD  Multiple Vitamin (MULTIVITAMIN WITH MINERALS) TABS tablet Take 1 tablet by mouth daily.    [provider]  olmesartan (BENICAR) 40 MG tablet Take 1 tablet (40 mg total) by mouth daily. For blood pressure 02/11/17   Claretta Fraise, MD  potassium chloride SA (KLOR-CON M20) 20 MEQ tablet TAKE 1 TABLET BY MOUTH DAILY FOR POTASSIUM 02/11/17   Claretta Fraise, MD  mirabegron ER (MYRBETRIQ) 25 MG TB24 tablet Take 1 tablet (25 mg total) by mouth daily. 01/30/14 03/02/14  Estill Dooms, NP    Family History Family History  Problem Relation Age of Onset  . Hypertension Mother   . Diabetes Mother        borderline  . Hypertension Father   . Asthma Father   . Stroke Father   . Diabetes Maternal Grandmother   . Congestive Heart Failure Maternal Grandmother   . Arthritis Maternal Grandmother   . Parkinson's disease  Maternal Grandfather   . Cancer Paternal Grandmother   . Alcohol abuse Paternal Grandfather   . Other Son        stomach issues    Social History Social History   Tobacco Use  . Smoking status: Former Smoker    Packs/day: 0.50    Years: 30.00    Pack years: 15.00    Types: Cigarettes    Last attempt to quit: 03/27/2013    Years since quitting: 4.1  . Smokeless tobacco: Never Used  Substance Use Topics  . Alcohol use: No  . Drug use: No     Allergies   Ace inhibitors   Review of Systems Review of Systems  Constitutional: Negative for activity change.       All ROS Neg except as noted in HPI  HENT: Negative for nosebleeds.   Eyes: Negative for photophobia and discharge.  Respiratory: Negative for cough, shortness of breath and wheezing.   Cardiovascular: Negative for chest pain and palpitations.  Gastrointestinal: Negative for abdominal pain and blood in stool.  Genitourinary: Negative for dysuria, frequency and hematuria.  Musculoskeletal: Negative for arthralgias, back pain and neck pain.  Skin: Positive for rash.  Neurological: Negative for dizziness, seizures and speech difficulty.  Psychiatric/Behavioral: Negative for confusion and hallucinations.     Physical Exam Updated Vital Signs BP (!) 159/104 (BP Location: Right Arm)   Pulse 84   Temp 99.3 F (37.4 C) (Oral)   Resp 19   Ht 5\' 5"  (1.651 m)   Wt 99.3 kg (219 lb)   SpO2 95%   BMI 36.44 kg/m   Physical Exam  Constitutional: She is oriented to person, place, and time. She appears well-developed and well-nourished.  Non-toxic appearance.  HENT:  Head: Normocephalic.  Right Ear: Tympanic membrane and external ear normal.  Left Ear: Tympanic membrane and external ear normal.  Airway patent.  No swelling of the tongue or lips.  Eyes: Pupils are equal, round, and reactive to light. EOM and lids are normal.  Neck: Normal range of motion. Neck supple. Carotid bruit is not present.  Cardiovascular:  Normal rate, regular rhythm, normal heart sounds, intact distal pulses and normal pulses.  Pulmonary/Chest: Breath sounds normal. No respiratory distress.  Patient speaks in complete sentences.  Lungs are clear to anterior and posterior auscultation.  Abdominal: Soft. Bowel sounds are normal. There is no tenderness. There is no guarding.  Musculoskeletal: Normal range of motion.  Lymphadenopathy:       Head (right side): No submandibular adenopathy present.       Head (left side): No submandibular adenopathy present.    She has no cervical adenopathy.  Neurological: She is alert and oriented to person, place, and time. She has normal strength. No cranial nerve deficit or sensory deficit.  Skin: Skin is warm and dry.  Resolving hives on the back and abdomen.  Hives noted on the inner aspect of the left arm.  There are a few on the palms of the hand.  Psychiatric: She has a normal mood and affect. Her speech is normal.  Nursing note and vitals reviewed.    ED Treatments / Results  Labs (all labs ordered are listed, but only abnormal results are displayed) Labs Reviewed - No data to display  EKG None  Radiology No results found.  Procedures Procedures (including critical care time)  Medications Ordered in ED Medications  hydrOXYzine (ATARAX/VISTARIL) tablet 25 mg (has no administration in time range)  predniSONE (DELTASONE) tablet 40 mg (has no administration in time range)  famotidine (PEPCID) tablet 20 mg (has no administration in time range)     Initial Impression / Assessment and Plan / ED Course  I have reviewed the triage vital signs and the nursing notes.  Pertinent labs & imaging results that were available during my care of the patient were reviewed by me and considered in my medical decision making (see chart for details).       Final Clinical Impressions(s) / ED Diagnoses MDM  Patient has a rash that resembles hives over the last 24 hours.  The patient had a  salad with a different type of salad dressing that she usually uses, but otherwise no new medications, dryer sheets, clothing, lotions, soap, or change in environment.  The examination endorses hives.  Patient will be treated with Benadryl and Decadron and Pepcid.  I have asked the patient to see an allergy specialist to collect additional information concerning this rash.  Patient is in agreement with this plan.   Final diagnoses:  Hives    ED Discharge Orders        Ordered    dexamethasone (DECADRON) 4 MG tablet  2 times daily with meals     05/31/17 0026       Lily Kocher, PA-C 05/31/17 1607    Ripley Fraise, MD 06/01/17 [phone]

## 2017-09-22 ENCOUNTER — Other Ambulatory Visit: Payer: Self-pay | Admitting: Family Medicine

## 2017-10-20 ENCOUNTER — Encounter: Payer: Self-pay | Admitting: Family Medicine

## 2017-10-20 ENCOUNTER — Ambulatory Visit (INDEPENDENT_AMBULATORY_CARE_PROVIDER_SITE_OTHER): Payer: Self-pay | Admitting: Family Medicine

## 2017-10-20 VITALS — BP 110/74 | HR 72 | Temp 97.3°F | Ht 65.0 in | Wt 228.5 lb

## 2017-10-20 DIAGNOSIS — I10 Essential (primary) hypertension: Secondary | ICD-10-CM

## 2017-10-20 DIAGNOSIS — F329 Major depressive disorder, single episode, unspecified: Secondary | ICD-10-CM

## 2017-10-20 DIAGNOSIS — F32A Depression, unspecified: Secondary | ICD-10-CM

## 2017-10-20 MED ORDER — FUROSEMIDE 40 MG PO TABS: ORAL_TABLET | ORAL | 1 refills | Status: DC

## 2017-10-20 MED ORDER — DULOXETINE HCL 30 MG PO CPEP: 30.0000 mg | ORAL_CAPSULE | Freq: Every day | ORAL | 0 refills | Status: DC

## 2017-10-20 MED ORDER — CARVEDILOL 6.25 MG PO TABS: ORAL_TABLET | ORAL | 1 refills | Status: DC

## 2017-10-20 MED ORDER — FAMOTIDINE 20 MG PO TABS: 20.0000 mg | ORAL_TABLET | Freq: Two times a day (BID) | ORAL | 1 refills | Status: DC

## 2017-10-20 MED ORDER — OLMESARTAN MEDOXOMIL 40 MG PO TABS: ORAL_TABLET | ORAL | 1 refills | Status: DC

## 2017-10-20 MED ORDER — PHENTERMINE HCL 37.5 MG PO CAPS: 37.5000 mg | ORAL_CAPSULE | ORAL | 0 refills | Status: DC

## 2017-10-20 MED ORDER — POTASSIUM CHLORIDE CRYS ER 20 MEQ PO TBCR: EXTENDED_RELEASE_TABLET | ORAL | 1 refills | Status: DC

## 2017-10-20 MED ORDER — AMLODIPINE BESYLATE 10 MG PO TABS: ORAL_TABLET | ORAL | 1 refills | Status: DC

## 2017-10-20 NOTE — Progress Notes (Signed)
Subjective:  Patient ID: Kelli Thompson, female    DOB: Feb 07, 1965  Age: 52 y.o. MRN: [id number]  CC: Hypertension   HPI Kelli Thompson presents for  follow-up of hypertension. Patient has no history of headache chest pain or shortness of breath or recent cough. Patient also denies symptoms of TIA such as focal numbness or weakness. Patient denies side effects from medication. States taking it regularly. Pt. Trying to lose weight. Tearful due to family situation. Dealing with feelings of depression. (PHQ Below.)  Depression screen [facility] 2/9 10/20/2017 02/11/2017 04/08/2016 08/03/2015 04/30/2015  Decreased Interest 3 0 0 0 0  Down, Depressed, Hopeless 2 0 0 0 0  PHQ - 2 Score 5 0 0 0 0  Altered sleeping 0 - - - -  Tired, decreased energy 0 - - - -  Change in appetite 0 - - - -  Feeling bad or failure about yourself  0 - - - -  Trouble concentrating 0 - - - -  Moving slowly or fidgety/restless 2 - - - -  Suicidal thoughts 0 - - - -  PHQ-9 Score 7 - - - -    History Kelli Thompson has a past medical history of Anxiety, Hypertension, Pancreatitis, Peri-menopause (02/28/2014), Urinary frequency (01/30/2014), and Urinary incontinence, mixed (01/30/2014).   She has a past surgical history that includes Cesarean section; Cholecystectomy; Appendectomy; and Abdominal hysterectomy.   Her family history includes Alcohol abuse in her paternal grandfather; Arthritis in her maternal grandmother; Asthma in her father; Cancer in her paternal grandmother; Congestive Heart Failure in her maternal grandmother; Diabetes in her maternal grandmother and mother; Hypertension in her father and mother; Other in her son; Parkinson's disease in her maternal grandfather; Stroke in her father.She reports that she quit smoking about 4 years ago. Her smoking use included cigarettes. She has a 15.00 pack-year smoking history. She has never used smokeless tobacco. She reports that she does not drink alcohol or use drugs.  Current  Outpatient Medications on File Prior to Visit  Medication Sig Dispense Refill  . dexamethasone (DECADRON) 4 MG tablet Take 1 tablet (4 mg total) by mouth 2 (two) times daily with a meal. 10 tablet 0  . Fish Oil-Cholecalciferol (FISH OIL + D3 PO) Take 1 capsule by mouth daily.    . Multiple Vitamin (MULTIVITAMIN WITH MINERALS) TABS tablet Take 1 tablet by mouth daily.     No current facility-administered medications on file prior to visit.     ROS Review of Systems  Constitutional: Negative.   HENT: Negative for congestion.   Eyes: Negative for visual disturbance.  Respiratory: Negative for shortness of breath.   Cardiovascular: Negative for chest pain.  Gastrointestinal: Negative for abdominal pain, constipation, diarrhea, nausea and vomiting.  Genitourinary: Negative for difficulty urinating.  Musculoskeletal: Negative for arthralgias and myalgias.  Neurological: Negative for headaches.  Psychiatric/Behavioral: Negative for sleep disturbance.    Objective:  BP 110/74   Pulse 72   Temp (!) 97.3 F (36.3 C) (Oral)   Ht 5\' 5"  (1.651 m)   Wt 228 lb 8 oz (103.6 kg)   BMI 38.02 kg/m   BP Readings from Last 3 Encounters:  10/20/17 110/74  05/30/17 (!) 159/104  02/11/17 (!) 134/91    Wt Readings from Last 3 Encounters:  10/20/17 228 lb 8 oz (103.6 kg)  05/30/17 219 lb (99.3 kg)  02/11/17 227 lb (103 kg)     Physical Exam  Constitutional: She is oriented to person, place, and  time. She appears well-developed and well-nourished. No distress.  HENT:  Head: Normocephalic and atraumatic.  Right Ear: External ear normal.  Left Ear: External ear normal.  Nose: Nose normal.  Mouth/Throat: Oropharynx is clear and moist.  Eyes: Pupils are equal, round, and reactive to light. Conjunctivae and EOM are normal.  Neck: Normal range of motion. Neck supple. No thyromegaly present.  Cardiovascular: Normal rate, regular rhythm and normal heart sounds.  No murmur  heard. Pulmonary/Chest: Effort normal and breath sounds normal. No respiratory distress. She has no wheezes. She has no rales.  Abdominal: Soft. Bowel sounds are normal. She exhibits no distension. There is no tenderness.  Lymphadenopathy:    She has no cervical adenopathy.  Neurological: She is alert and oriented to person, place, and time. She has normal reflexes.  Skin: Skin is warm and dry.  Psychiatric: Her speech is normal. Judgment and thought content normal. Her mood appears anxious. Her affect is labile. She is agitated. Cognition and memory are normal. She exhibits a depressed mood.      Assessment & Plan:   Kelli Thompson was seen today for hypertension.  Diagnoses and all orders for this visit:  Essential hypertension  Depression, unspecified depression type  Morbid obesity ([place])  Other orders -     amLODipine (NORVASC) 10 MG tablet; TAKE ONE TABLET BY MOUTH DAILY FOR BLOOD PRESSURE -     carvedilol (COREG) 6.25 MG tablet; TAKE 1 TABLET BY MOUTH TWO TIMES A DAY WITH MEALS -     famotidine (PEPCID) 20 MG tablet; Take 1 tablet (20 mg total) by mouth 2 (two) times daily. -     furosemide (LASIX) 40 MG tablet; TAKE ONE TABLET BY MOUTH EVERY MORNING TO REDUCE SWELLING -     olmesartan (BENICAR) 40 MG tablet; TAKE ONE TABLET BY MOUTH DAILY FOR BLOOD PRESSURE -     potassium chloride SA (KLOR-CON M20) 20 MEQ tablet; TAKE 1 TABLET BY MOUTH DAILY FOR POTASSIUM -     DULoxetine (CYMBALTA) 30 MG capsule; Take 1 capsule (30 mg total) by mouth daily with supper. -     phentermine 37.5 MG capsule; Take 1 capsule (37.5 mg total) by mouth every morning.   Allergies as of 10/20/2017      Reactions   Ace Inhibitors    pancreatitis      Medication List        Accurate as of 10/20/17  8:48 PM. Always use your most recent med list.          amLODipine 10 MG tablet Commonly known as:  NORVASC TAKE ONE TABLET BY MOUTH DAILY FOR BLOOD PRESSURE   carvedilol 6.25 MG tablet Commonly  known as:  COREG TAKE 1 TABLET BY MOUTH TWO TIMES A DAY WITH MEALS   dexamethasone 4 MG tablet Commonly known as:  DECADRON Take 1 tablet (4 mg total) by mouth 2 (two) times daily with a meal.   DULoxetine 30 MG capsule Commonly known as:  CYMBALTA Take 1 capsule (30 mg total) by mouth daily with supper.   famotidine 20 MG tablet Commonly known as:  PEPCID Take 1 tablet (20 mg total) by mouth 2 (two) times daily.   FISH OIL + D3 PO Take 1 capsule by mouth daily.   furosemide 40 MG tablet Commonly known as:  LASIX TAKE ONE TABLET BY MOUTH EVERY MORNING TO REDUCE SWELLING   multivitamin with minerals Tabs tablet Take 1 tablet by mouth daily.   olmesartan 40 MG tablet  Commonly known as:  BENICAR TAKE ONE TABLET BY MOUTH DAILY FOR BLOOD PRESSURE   phentermine 37.5 MG capsule Take 1 capsule (37.5 mg total) by mouth every morning.   potassium chloride SA 20 MEQ tablet Commonly known as:  K-DUR,KLOR-CON TAKE 1 TABLET BY MOUTH DAILY FOR POTASSIUM       Meds ordered this encounter  Medications  . amLODipine (NORVASC) 10 MG tablet    Sig: TAKE ONE TABLET BY MOUTH DAILY FOR BLOOD PRESSURE    Dispense:  90 tablet    Refill:  1  . carvedilol (COREG) 6.25 MG tablet    Sig: TAKE 1 TABLET BY MOUTH TWO TIMES A DAY WITH MEALS    Dispense:  180 tablet    Refill:  1  . famotidine (PEPCID) 20 MG tablet    Sig: Take 1 tablet (20 mg total) by mouth 2 (two) times daily.    Dispense:  180 tablet    Refill:  1  . furosemide (LASIX) 40 MG tablet    Sig: TAKE ONE TABLET BY MOUTH EVERY MORNING TO REDUCE SWELLING    Dispense:  90 tablet    Refill:  1  . olmesartan (BENICAR) 40 MG tablet    Sig: TAKE ONE TABLET BY MOUTH DAILY FOR BLOOD PRESSURE    Dispense:  90 tablet    Refill:  1  . potassium chloride SA (KLOR-CON M20) 20 MEQ tablet    Sig: TAKE 1 TABLET BY MOUTH DAILY FOR POTASSIUM    Dispense:  90 tablet    Refill:  1  . DULoxetine (CYMBALTA) 30 MG capsule    Sig: Take 1  capsule (30 mg total) by mouth daily with supper.    Dispense:  30 capsule    Refill:  0  . phentermine 37.5 MG capsule    Sig: Take 1 capsule (37.5 mg total) by mouth every morning.    Dispense:  30 capsule    Refill:  0    Try phentermine for weight loss assistance. Needs a more organized approach. Discussed low carb approach to dieting.  Follow-up: Return in about 1 month (around 11/19/2017).  Claretta Fraise, M.D.

## 2017-10-26 ENCOUNTER — Other Ambulatory Visit: Payer: Self-pay | Admitting: Family Medicine

## 2017-11-17 ENCOUNTER — Ambulatory Visit: Payer: Self-pay | Admitting: Family Medicine

## 2017-11-17 ENCOUNTER — Encounter: Payer: Self-pay | Admitting: Family Medicine

## 2017-11-17 VITALS — BP 134/96 | HR 81 | Temp 97.6°F | Ht 65.0 in | Wt 219.4 lb

## 2017-11-17 DIAGNOSIS — Z23 Encounter for immunization: Secondary | ICD-10-CM

## 2017-11-17 DIAGNOSIS — F32A Depression, unspecified: Secondary | ICD-10-CM

## 2017-11-17 DIAGNOSIS — F329 Major depressive disorder, single episode, unspecified: Secondary | ICD-10-CM

## 2017-11-17 MED ORDER — PHENTERMINE HCL 37.5 MG PO CAPS: 37.5000 mg | ORAL_CAPSULE | ORAL | 5 refills | Status: DC

## 2017-11-17 MED ORDER — DULOXETINE HCL 60 MG PO CPEP: 60.0000 mg | ORAL_CAPSULE | Freq: Every day | ORAL | 5 refills | Status: DC

## 2017-11-17 NOTE — Progress Notes (Signed)
Chief Complaint  Patient presents with  . Depression    1 mos fu cymbalta    HPI  Patient presents today for recheck of depression. Sx much better. Denies side effects. Depression screen [facility] 2/9 11/17/2017 10/20/2017 02/11/2017 04/08/2016 08/03/2015  Decreased Interest 0 3 0 0 0  Down, Depressed, Hopeless 0 2 0 0 0  PHQ - 2 Score 0 5 0 0 0  Altered sleeping - 0 - - -  Tired, decreased energy - 0 - - -  Change in appetite - 0 - - -  Feeling bad or failure about yourself  - 0 - - -  Trouble concentrating - 0 - - -  Moving slowly or fidgety/restless - 2 - - -  Suicidal thoughts - 0 - - -  PHQ-9 Score - 7 - - -     PMH: Smoking status noted ROS: Per HPI  Objective: BP (!) 134/96   Pulse 81   Temp 97.6 F (36.4 C) (Oral)   Ht 5\' 5"  (1.651 m)   Wt 219 lb 6.4 oz (99.5 kg)   BMI 36.51 kg/m  Gen: NAD, alert, cooperative with exam HEENT: NCAT, EOMI, PERRL CV: RRR, good S1/S2, no murmur Resp: CTABL, no wheezes, non-labored Abd: SNTND, BS present, no guarding or organomegaly Ext: No edema, warm Neuro: Alert and oriented, No gross deficits  Assessment and plan:  1. Depression, unspecified depression type   2. Morbid obesity ([place])   3. Need for immunization against influenza     Meds ordered this encounter  Medications  . DULoxetine (CYMBALTA) 60 MG capsule    Sig: Take 1 capsule (60 mg total) by mouth daily with supper.    Dispense:  30 capsule    Refill:  5  . phentermine 37.5 MG capsule    Sig: Take 1 capsule (37.5 mg total) by mouth every morning.    Dispense:  30 capsule    Refill:  5    Orders Placed This Encounter  Procedures  . Flu Vaccine QUAD 36+ mos IM    Follow up as needed.  Claretta Fraise, MD

## 2018-01-30 ENCOUNTER — Encounter (HOSPITAL_COMMUNITY): Payer: Self-pay | Admitting: Emergency Medicine

## 2018-01-30 ENCOUNTER — Emergency Department (HOSPITAL_COMMUNITY)
Admission: EM | Admit: 2018-01-30 | Discharge: 2018-01-30 | Disposition: A | Discharge status: Home / Self Care | Payer: Self-pay | Attending: Emergency Medicine | Admitting: Emergency Medicine

## 2018-01-30 DIAGNOSIS — H6591 Unspecified nonsuppurative otitis media, right ear: Secondary | ICD-10-CM | POA: Insufficient documentation

## 2018-01-30 DIAGNOSIS — J0100 Acute maxillary sinusitis, unspecified: Secondary | ICD-10-CM | POA: Insufficient documentation

## 2018-01-30 DIAGNOSIS — Z87891 Personal history of nicotine dependence: Secondary | ICD-10-CM | POA: Insufficient documentation

## 2018-01-30 DIAGNOSIS — Z79899 Other long term (current) drug therapy: Secondary | ICD-10-CM | POA: Insufficient documentation

## 2018-01-30 DIAGNOSIS — I10 Essential (primary) hypertension: Secondary | ICD-10-CM | POA: Insufficient documentation

## 2018-01-30 MED ORDER — PROMETHAZINE-DM 6.25-15 MG/5ML PO SYRP: 5.0000 mL | ORAL_SOLUTION | Freq: Four times a day (QID) | ORAL | 0 refills | Status: DC | PRN

## 2018-01-30 MED ORDER — AMOXICILLIN 500 MG PO CAPS: 500.0000 mg | ORAL_CAPSULE | Freq: Three times a day (TID) | ORAL | 0 refills | Status: DC

## 2018-01-30 NOTE — Discharge Instructions (Addendum)
Tylenol every 4 hours if needed for pain or fever.  Be sure to take the antibiotic as directed until its finished.  Follow-up with your primary provider for recheck

## 2018-01-30 NOTE — ED Triage Notes (Signed)
Pt states she has had a cough since 3 days before Christmas.

## 2018-01-30 NOTE — ED Provider Notes (Signed)
[facility] EMERGENCY DEPARTMENT Provider Note   CSN: [id number] Arrival date & time: 01/30/18  1705     History   Chief Complaint Chief Complaint  Patient presents with  . Cough    HPI Kelli Thompson is a 53 y.o. female.  HPI   Kelli Thompson is a 53 y.o. female who presents to the Emergency Department complaining of bilateral ear pain, nasal congestion, sinus pressure pain and cough.  Symptoms present for 3 days.  Cough is described as non-productive.  She mostly describes a throbbing pain and pressure to both ears and pain now worse in the right ear.  She denies headache, fever, dizziness, vomiting and shortness of breath   Past Medical History:  Diagnosis Date  . Anxiety   . Hypertension   . Pancreatitis   . Peri-menopause 02/28/2014  . Urinary frequency 01/30/2014  . Urinary incontinence, mixed 01/30/2014    Patient Active Problem List   Diagnosis Date Noted  . Localized edema 04/30/2015  . Peri-menopause 02/28/2014  . Urinary incontinence, mixed 01/30/2014  . Hypokalemia 04/18/2013  . HTN (hypertension) 04/17/2013    Past Surgical History:  Procedure Laterality Date  . ABDOMINAL HYSTERECTOMY    . APPENDECTOMY    . CESAREAN SECTION     x2  . CHOLECYSTECTOMY       OB History    Gravida  3   Para  3   Term  3   Preterm      AB      Living  3     SAB      TAB      Ectopic      Multiple      Live Births               Home Medications    Prior to Admission medications   Medication Sig Start Date End Date Taking? Authorizing Provider  amLODipine (NORVASC) 10 MG tablet TAKE ONE TABLET BY MOUTH DAILY FOR BLOOD PRESSURE 10/20/17   Claretta Fraise, MD  amoxicillin (AMOXIL) 500 MG capsule Take 1 capsule (500 mg total) by mouth 3 (three) times daily. 01/30/18   Ayomide Purdy, PA-C  carvedilol (COREG) 6.25 MG tablet TAKE 1 TABLET BY MOUTH TWO TIMES A DAY WITH MEALS 10/20/17   Claretta Fraise, MD  dexamethasone (DECADRON) 4 MG tablet Take 1  tablet (4 mg total) by mouth 2 (two) times daily with a meal. 05/31/17   Lily Kocher, PA-C  DULoxetine (CYMBALTA) 60 MG capsule Take 1 capsule (60 mg total) by mouth daily with supper. 11/17/17   Claretta Fraise, MD  famotidine (PEPCID) 20 MG tablet Take 1 tablet (20 mg total) by mouth 2 (two) times daily. 10/20/17   Claretta Fraise, MD  Fish Oil-Cholecalciferol (FISH OIL + D3 PO) Take 1 capsule by mouth daily.    [provider]  furosemide (LASIX) 40 MG tablet TAKE ONE TABLET BY MOUTH EVERY MORNING TO REDUCE SWELLING 10/20/17   Claretta Fraise, MD  Multiple Vitamin (MULTIVITAMIN WITH MINERALS) TABS tablet Take 1 tablet by mouth daily.    [provider]  olmesartan (BENICAR) 40 MG tablet TAKE ONE TABLET BY MOUTH DAILY FOR BLOOD PRESSURE 10/20/17   Claretta Fraise, MD  phentermine 37.5 MG capsule Take 1 capsule (37.5 mg total) by mouth every morning. 11/17/17   Claretta Fraise, MD  potassium chloride SA (KLOR-CON M20) 20 MEQ tablet TAKE 1 TABLET BY MOUTH DAILY FOR POTASSIUM 10/20/17   Claretta Fraise, MD  promethazine-dextromethorphan (PROMETHAZINE-DM) 6.25-15 MG/5ML syrup Take 5 mLs by mouth 4 (four) times daily as needed. 01/30/18   Kem Parkinson, PA-C    Family History Family History  Problem Relation Age of Onset  . Hypertension Mother   . Diabetes Mother        borderline  . Hypertension Father   . Asthma Father   . Stroke Father   . Diabetes Maternal Grandmother   . Congestive Heart Failure Maternal Grandmother   . Arthritis Maternal Grandmother   . Parkinson's disease Maternal Grandfather   . Cancer Paternal Grandmother   . Alcohol abuse Paternal Grandfather   . Other Son        stomach issues    Social History Social History   Tobacco Use  . Smoking status: Former Smoker    Packs/day: 0.50    Years: 30.00    Pack years: 15.00    Types: Cigarettes    Last attempt to quit: 03/27/2013    Years since quitting: 4.8  . Smokeless tobacco: Never Used  Substance Use  Topics  . Alcohol use: No  . Drug use: No     Allergies   Ace inhibitors   Review of Systems Review of Systems  Constitutional: Negative for activity change, appetite change, chills and fever.  HENT: Positive for congestion, ear pain, sinus pressure, sinus pain and sore throat. Negative for facial swelling, rhinorrhea and trouble swallowing.   Eyes: Negative for visual disturbance.  Respiratory: Positive for cough. Negative for shortness of breath, wheezing and stridor.   Cardiovascular: Negative for chest pain.  Gastrointestinal: Negative for abdominal pain, nausea and vomiting.  Musculoskeletal: Positive for myalgias (generalized body aches). Negative for arthralgias, neck pain and neck stiffness.  Skin: Negative for rash.  Neurological: Negative for dizziness, syncope, weakness, numbness and headaches.  Hematological: Negative for adenopathy.  Psychiatric/Behavioral: Negative for confusion.     Physical Exam Updated Vital Signs BP (!) 153/95 (BP Location: Right Arm)   Pulse 78   Temp 98 F (36.7 C) (Oral)   Resp 15   Ht 5\' 5"  (1.651 m)   Wt 94.8 kg   SpO2 98%   BMI 34.78 kg/m   Physical Exam Vitals signs and nursing note reviewed.  Constitutional:      General: She is not in acute distress.    Appearance: Normal appearance. She is not ill-appearing or toxic-appearing.  HENT:     Head: Normocephalic.     Right Ear: Ear canal normal.     Left Ear: Tympanic membrane and ear canal normal.     Ears:     Comments: Mild edema and erythema of the right TM.  No drainage.  Loss of landmarks to the middle ear.      Mouth/Throat:     Mouth: Mucous membranes are moist.     Pharynx: Oropharynx is clear. No oropharyngeal exudate or posterior oropharyngeal erythema.  Neck:     Musculoskeletal: Normal range of motion. No muscular tenderness.  Cardiovascular:     Rate and Rhythm: Normal rate and regular rhythm.     Pulses: Normal pulses.  Pulmonary:     Effort: Pulmonary  effort is normal. No respiratory distress.     Breath sounds: Normal breath sounds. No wheezing or rhonchi.  Musculoskeletal: Normal range of motion.  Lymphadenopathy:     Cervical: No cervical adenopathy.  Skin:    General: Skin is warm.     Capillary Refill: Capillary refill takes less than 2 seconds.  Findings: No rash.  Neurological:     General: No focal deficit present.     Sensory: No sensory deficit.     Motor: No weakness.      ED Treatments / Results  Labs (all labs ordered are listed, but only abnormal results are displayed) Labs Reviewed - No data to display  EKG None  Radiology No results found.  Procedures Procedures (including critical care time)  Medications Ordered in ED Medications - No data to display   Initial Impression / Assessment and Plan / ED Course  I have reviewed the triage vital signs and the nursing notes.  Pertinent labs & imaging results that were available during my care of the patient were reviewed by me and considered in my medical decision making (see chart for details).     Pt with right OM.  She is well appearing, non-toxic.  Vitals reassuring.  Will treat with amoxil and anti-tussive.  She agrees to plan and close out pt f/u if needed.  Return precautions discussed.   Final Clinical Impressions(s) / ED Diagnoses   Final diagnoses:  Right non-suppurative otitis media  Acute maxillary sinusitis, recurrence not specified    ED Discharge Orders         Ordered    amoxicillin (AMOXIL) 500 MG capsule  3 times daily     01/30/18 1746    promethazine-dextromethorphan (PROMETHAZINE-DM) 6.25-15 MG/5ML syrup  4 times daily PRN     01/30/18 1746           Kem Parkinson, PA-C 01/30/18 1924    Nat Christen, MD 01/31/18 1739

## 2018-02-02 ENCOUNTER — Other Ambulatory Visit: Payer: Self-pay

## 2018-02-02 ENCOUNTER — Encounter (HOSPITAL_COMMUNITY): Payer: Self-pay | Admitting: Emergency Medicine

## 2018-02-02 ENCOUNTER — Emergency Department (HOSPITAL_COMMUNITY)
Admission: EM | Admit: 2018-02-02 | Discharge: 2018-02-02 | Disposition: A | Discharge status: Home / Self Care | Payer: Self-pay | Attending: Emergency Medicine | Admitting: Emergency Medicine

## 2018-02-02 DIAGNOSIS — R42 Dizziness and giddiness: Secondary | ICD-10-CM | POA: Insufficient documentation

## 2018-02-02 DIAGNOSIS — Z5321 Procedure and treatment not carried out due to patient leaving prior to being seen by health care provider: Secondary | ICD-10-CM | POA: Insufficient documentation

## 2018-02-02 NOTE — ED Triage Notes (Signed)
Pt was standing in the kitchen, has an episode of dizziness, and nausea, pt is being treated for sinus and ear infection.

## 2018-04-18 ENCOUNTER — Other Ambulatory Visit: Payer: Self-pay | Admitting: Family Medicine

## 2018-05-06 ENCOUNTER — Other Ambulatory Visit: Payer: Self-pay | Admitting: Family Medicine

## 2018-05-21 ENCOUNTER — Telehealth: Payer: Self-pay | Admitting: Family Medicine

## 2018-05-21 ENCOUNTER — Encounter: Payer: Self-pay | Admitting: Family Medicine

## 2018-05-21 ENCOUNTER — Ambulatory Visit (INDEPENDENT_AMBULATORY_CARE_PROVIDER_SITE_OTHER): Payer: Self-pay | Admitting: Family Medicine

## 2018-05-21 ENCOUNTER — Other Ambulatory Visit: Payer: Self-pay

## 2018-05-21 DIAGNOSIS — I10 Essential (primary) hypertension: Secondary | ICD-10-CM

## 2018-05-21 DIAGNOSIS — M779 Enthesopathy, unspecified: Secondary | ICD-10-CM

## 2018-05-21 MED ORDER — CARVEDILOL 6.25 MG PO TABS: ORAL_TABLET | ORAL | 1 refills | Status: DC

## 2018-05-21 MED ORDER — DULOXETINE HCL 60 MG PO CPEP: 60.0000 mg | ORAL_CAPSULE | Freq: Every day | ORAL | 3 refills | Status: DC

## 2018-05-21 MED ORDER — PHENTERMINE HCL 37.5 MG PO CAPS: 37.5000 mg | ORAL_CAPSULE | ORAL | 5 refills | Status: DC

## 2018-05-21 MED ORDER — PREDNISONE 10 MG PO TABS: ORAL_TABLET | ORAL | 0 refills | Status: DC

## 2018-05-21 MED ORDER — POTASSIUM CHLORIDE CRYS ER 20 MEQ PO TBCR: EXTENDED_RELEASE_TABLET | ORAL | 1 refills | Status: DC

## 2018-05-21 MED ORDER — AMLODIPINE BESYLATE 10 MG PO TABS: ORAL_TABLET | ORAL | 1 refills | Status: DC

## 2018-05-21 MED ORDER — OLMESARTAN MEDOXOMIL 40 MG PO TABS: 40.0000 mg | ORAL_TABLET | Freq: Every day | ORAL | 1 refills | Status: DC

## 2018-05-21 MED ORDER — FAMOTIDINE 20 MG PO TABS: 20.0000 mg | ORAL_TABLET | Freq: Two times a day (BID) | ORAL | 3 refills | Status: DC

## 2018-05-21 MED ORDER — FUROSEMIDE 40 MG PO TABS: 40.0000 mg | ORAL_TABLET | Freq: Every day | ORAL | 1 refills | Status: DC

## 2018-05-21 NOTE — Addendum Note (Signed)
Addended by: Denyce Robert on: 05/21/2018 01:18 PM   Modules accepted: Orders

## 2018-05-21 NOTE — Telephone Encounter (Signed)
Called pharm and aware of directions

## 2018-05-21 NOTE — Progress Notes (Signed)
Subjective:    Patient ID: Kelli Thompson, female    DOB: Jul 15, 1965, 53 y.o.   MRN: [id number]   HPI: Kelli Thompson is a 53 y.o. female presenting for  Follow-up of hypertension. Patient has no history of headache chest pain or shortness of breath or recent cough. Patient also denies symptoms of TIA such as numbness weakness lateralizing. Patient checks  blood pressure at home and has not had any elevated readings recently. Patient denies side effects from his medication. States taking it regularly. BP running right where it needs to be. 129/74 right now.  Pain in palm of right hand for 2 months. Knots in palm. Cramping in center of palm to elbow.  Weight is down to 199- 203   Depression screen [facility] 2/9 11/17/2017 10/20/2017 02/11/2017 04/08/2016 08/03/2015  Decreased Interest 0 3 0 0 0  Down, Depressed, Hopeless 0 2 0 0 0  PHQ - 2 Score 0 5 0 0 0  Altered sleeping - 0 - - -  Tired, decreased energy - 0 - - -  Change in appetite - 0 - - -  Feeling bad or failure about yourself  - 0 - - -  Trouble concentrating - 0 - - -  Moving slowly or fidgety/restless - 2 - - -  Suicidal thoughts - 0 - - -  PHQ-9 Score - 7 - - -     Relevant past medical, surgical, family and social history reviewed and updated as indicated.  Interim medical history since our last visit reviewed. Allergies and medications reviewed and updated.  ROS:  Review of Systems  Constitutional: Negative.   HENT: Negative.   Eyes: Negative for visual disturbance.  Respiratory: Negative for shortness of breath.   Cardiovascular: Negative for chest pain.  Gastrointestinal: Negative for abdominal pain.  Musculoskeletal: Positive for arthralgias.     Social History   Tobacco Use  Smoking Status Former Smoker  . Packs/day: 0.50  . Years: 30.00  . Pack years: 15.00  . Types: Cigarettes  . Last attempt to quit: 03/27/2013  . Years since quitting: 5.1  Smokeless Tobacco Never Used       Objective:     Wt  Readings from Last 3 Encounters:  02/02/18 209 lb (94.8 kg)  01/30/18 209 lb (94.8 kg)  11/17/17 219 lb 6.4 oz (99.5 kg)     Exam deferred. Pt. Harboring due to COVID 19. Phone visit performed.   Assessment & Plan:   1. Essential hypertension   2. Tendonitis     Meds ordered this encounter  Medications  . DULoxetine (CYMBALTA) 60 MG capsule    Sig: Take 1 capsule (60 mg total) by mouth daily with supper.    Dispense:  90 capsule    Refill:  3  . predniSONE (DELTASONE) 10 MG tablet    Sig: Take 5 daily for 3 days followed by 4,3,2 and 1 for 3 days each.    Dispense:  45 tablet    Refill:  0  . DISCONTD: phentermine 37.5 MG capsule    Sig: Take 1 capsule (37.5 mg total) by mouth every morning.    Dispense:  30 capsule    Refill:  5  . phentermine 37.5 MG capsule    Sig: Take 1 capsule (37.5 mg total) by mouth every morning.    Dispense:  30 capsule    Refill:  5  . amLODipine (NORVASC) 10 MG tablet    Sig: TAKE ONE TABLET  BY MOUTH DAILY FOR BLOOD PRESSURE    Dispense:  90 tablet    Refill:  1  . carvedilol (COREG) 6.25 MG tablet    Sig: TAKE 1 TABLET BY MOUTH TWO TIMES A DAY WITH MEALS    Dispense:  180 tablet    Refill:  1  . famotidine (PEPCID) 20 MG tablet    Sig: Take 1 tablet (20 mg total) by mouth 2 (two) times daily.    Dispense:  180 tablet    Refill:  3  . furosemide (LASIX) 40 MG tablet    Sig: Take 1 tablet (40 mg total) by mouth daily.    Dispense:  90 tablet    Refill:  1  . olmesartan (BENICAR) 40 MG tablet    Sig: Take 1 tablet (40 mg total) by mouth daily. For BP    Dispense:  90 tablet    Refill:  1  . potassium chloride SA (K-DUR) 20 MEQ tablet    Sig: TAKE ONE TABLET BY MOUTH DAILY FOR POTASSIUM    Dispense:  90 tablet    Refill:  1    No orders of the defined types were placed in this encounter.     Diagnoses and all orders for this visit:  Essential hypertension  Tendonitis  Other orders -     DULoxetine (CYMBALTA) 60 MG capsule;  Take 1 capsule (60 mg total) by mouth daily with supper. -     predniSONE (DELTASONE) 10 MG tablet; Take 5 daily for 3 days followed by 4,3,2 and 1 for 3 days each. -     Discontinue: phentermine 37.5 MG capsule; Take 1 capsule (37.5 mg total) by mouth every morning. -     phentermine 37.5 MG capsule; Take 1 capsule (37.5 mg total) by mouth every morning. -     amLODipine (NORVASC) 10 MG tablet; TAKE ONE TABLET BY MOUTH DAILY FOR BLOOD PRESSURE -     carvedilol (COREG) 6.25 MG tablet; TAKE 1 TABLET BY MOUTH TWO TIMES A DAY WITH MEALS -     famotidine (PEPCID) 20 MG tablet; Take 1 tablet (20 mg total) by mouth 2 (two) times daily. -     furosemide (LASIX) 40 MG tablet; Take 1 tablet (40 mg total) by mouth daily. -     olmesartan (BENICAR) 40 MG tablet; Take 1 tablet (40 mg total) by mouth daily. For BP -     potassium chloride SA (K-DUR) 20 MEQ tablet; TAKE ONE TABLET BY MOUTH DAILY FOR POTASSIUM    Virtual Visit via telephone Note  I discussed the limitations, risks, security and privacy concerns of performing an evaluation and management service by telephone and the availability of in person appointments. The patient was identified with two identifiers. Pt.expressed understanding and agreed to proceed. Pt. Is at home. Dr. Livia Snellen is in his office.  Follow Up Instructions:   I discussed the assessment and treatment plan with the patient. The patient was provided an opportunity to ask questions and all were answered. The patient agreed with the plan and demonstrated an understanding of the instructions.   The patient was advised to call back or seek an in-person evaluation if the symptoms worsen or if the condition fails to improve as anticipated.  Visit started: 11:02 Call ended:  11:13 Total minutes including chart review and phone contact time: 16   Follow up plan: No follow-ups on file.  Claretta Fraise, MD [place]

## 2018-09-17 ENCOUNTER — Encounter (HOSPITAL_COMMUNITY): Payer: Self-pay | Admitting: Emergency Medicine

## 2018-09-17 ENCOUNTER — Other Ambulatory Visit: Payer: Self-pay

## 2018-09-17 ENCOUNTER — Emergency Department (HOSPITAL_COMMUNITY)
Admission: EM | Admit: 2018-09-17 | Discharge: 2018-09-18 | Disposition: A | Discharge status: Home / Self Care | Payer: Self-pay | Attending: Emergency Medicine | Admitting: Emergency Medicine

## 2018-09-17 ENCOUNTER — Emergency Department (HOSPITAL_COMMUNITY): Payer: Self-pay

## 2018-09-17 DIAGNOSIS — Z79899 Other long term (current) drug therapy: Secondary | ICD-10-CM | POA: Insufficient documentation

## 2018-09-17 DIAGNOSIS — I10 Essential (primary) hypertension: Secondary | ICD-10-CM | POA: Insufficient documentation

## 2018-09-17 DIAGNOSIS — M25511 Pain in right shoulder: Secondary | ICD-10-CM | POA: Insufficient documentation

## 2018-09-17 DIAGNOSIS — F17200 Nicotine dependence, unspecified, uncomplicated: Secondary | ICD-10-CM | POA: Insufficient documentation

## 2018-09-17 MED ORDER — HYDROCODONE-ACETAMINOPHEN 5-325 MG PO TABS: 1.0000 | ORAL_TABLET | ORAL | 0 refills | Status: DC | PRN

## 2018-09-17 NOTE — Discharge Instructions (Addendum)
The specific cause for your pain is not clear during this visit.  Please make a follow-up appointment with the orthopedic physician, who may want additional testing.  In the mean time, use the sling as needed and apply ice as needed.  Take acetaminophen and ibuprofen as needed for pain, reserve hydrocodone-acetaminophen for severe pain.

## 2018-09-17 NOTE — ED Triage Notes (Signed)
Pt c/o shoulder pain x6-8weeks. Was helping husband change battery in car and twisted back when car battery slipped off of car. Pt states feels like pulled muscle. Tingling present at times. Pt states getting progressively worse pain despite treatment with ibuprofen.

## 2018-09-17 NOTE — ED Provider Notes (Signed)
[facility] EMERGENCY DEPARTMENT Provider Note   CSN: [id number] Arrival date & time: 09/17/18  2050    History   Chief Complaint Chief Complaint  Patient presents with  . Shoulder Pain    HPI Kelli Thompson is a 53 y.o. female.   The history is provided by the patient.  Shoulder Pain She has history of hypertension and comes in with shoulder pain for the last 6-8 weeks.  She states that she was helping her husband with replacing a battery in a car, and when the battery started to fall, she jerked forward and noted severe pain in her right shoulder.  Pain has persisted and is getting worse.  It is worse with movement.  She rates pain a 10/10.  There is some intermittent numbness in her fingers.  She has been taking ibuprofen without relief.  She has applied heat without benefit.  Past Medical History:  Diagnosis Date  . Anxiety   . Hypertension   . Pancreatitis   . Peri-menopause 02/28/2014  . Urinary frequency 01/30/2014  . Urinary incontinence, mixed 01/30/2014    Patient Active Problem List   Diagnosis Date Noted  . Localized edema 04/30/2015  . Peri-menopause 02/28/2014  . Urinary incontinence, mixed 01/30/2014  . Hypokalemia 04/18/2013  . HTN (hypertension) 04/17/2013    Past Surgical History:  Procedure Laterality Date  . ABDOMINAL HYSTERECTOMY    . APPENDECTOMY    . CESAREAN SECTION     x2  . CHOLECYSTECTOMY       OB History    Gravida  3   Para  3   Term  3   Preterm      AB      Living  3     SAB      TAB      Ectopic      Multiple      Live Births               Home Medications    Prior to Admission medications   Medication Sig Start Date End Date Taking? Authorizing Provider  amLODipine (NORVASC) 10 MG tablet TAKE ONE TABLET BY MOUTH DAILY FOR BLOOD PRESSURE 05/21/18   Claretta Fraise, MD  carvedilol (COREG) 6.25 MG tablet TAKE 1 TABLET BY MOUTH TWO TIMES A DAY WITH MEALS 05/21/18   Claretta Fraise, MD  DULoxetine (CYMBALTA) 60  MG capsule Take 1 capsule (60 mg total) by mouth daily with supper. 05/21/18   Claretta Fraise, MD  famotidine (PEPCID) 20 MG tablet Take 1 tablet (20 mg total) by mouth 2 (two) times daily. 05/21/18   Claretta Fraise, MD  Fish Oil-Cholecalciferol (FISH OIL + D3 PO) Take 1 capsule by mouth daily.    [provider]  furosemide (LASIX) 40 MG tablet Take 1 tablet (40 mg total) by mouth daily. 05/21/18   Claretta Fraise, MD  Multiple Vitamin (MULTIVITAMIN WITH MINERALS) TABS tablet Take 1 tablet by mouth daily.    [provider]  olmesartan (BENICAR) 40 MG tablet Take 1 tablet (40 mg total) by mouth daily. For BP 05/21/18   Claretta Fraise, MD  phentermine 37.5 MG capsule Take 1 capsule (37.5 mg total) by mouth every morning. 05/21/18   Claretta Fraise, MD  potassium chloride SA (K-DUR) 20 MEQ tablet TAKE ONE TABLET BY MOUTH DAILY FOR POTASSIUM 05/21/18   Claretta Fraise, MD  predniSONE (DELTASONE) 10 MG tablet Take 5 daily for 3 days followed by 4,3,2 and 1 for 3 days  each. 05/21/18   Claretta Fraise, MD    Family History Family History  Problem Relation Age of Onset  . Hypertension Mother   . Diabetes Mother        borderline  . Hypertension Father   . Asthma Father   . Stroke Father   . Diabetes Maternal Grandmother   . Congestive Heart Failure Maternal Grandmother   . Arthritis Maternal Grandmother   . Parkinson's disease Maternal Grandfather   . Cancer Paternal Grandmother   . Alcohol abuse Paternal Grandfather   . Other Son        stomach issues    Social History Social History   Tobacco Use  . Smoking status: Former Smoker    Packs/day: 0.50    Years: 30.00    Pack years: 15.00    Types: Cigarettes    Quit date: 03/27/2013    Years since quitting: 5.4  . Smokeless tobacco: Never Used  Substance Use Topics  . Alcohol use: No  . Drug use: No     Allergies   Ace inhibitors   Review of Systems Review of Systems  All other systems reviewed and are negative.     Physical Exam Updated Vital Signs BP (!) 149/108 (BP Location: Left Arm)   Pulse (!) 101   Temp 98.1 F (36.7 C) (Oral)   Resp 16   Ht 5\' 5"  (1.651 m)   Wt 94.3 kg   SpO2 100%   BMI 34.61 kg/m   Physical Exam Vitals signs and nursing note reviewed.    53 year old female, resting comfortably and in no acute distress. Vital signs are significant for elevated blood pressure and borderline elevated heart rate. Oxygen saturation is 100%, which is normal. Head is normocephalic and atraumatic. PERRLA, EOMI. Oropharynx is clear. Neck is nontender and supple without adenopathy or JVD. Back is nontender and there is no CVA tenderness. Lungs are clear without rales, wheezes, or rhonchi. Chest is nontender. Heart has regular rate and rhythm without murmur. Abdomen is soft, flat, nontender without masses or hepatosplenomegaly and peristalsis is normoactive. Extremities: No swelling or deformity present in the right shoulder, but there is tenderness rather diffusely throughout the shoulder region and a significant pain with any passive movement.  Distal neurovascular exam is intact with strong pulses, prompt capillary refill, normal sensation. Skin is warm and dry without rash. Neurologic: Mental status is normal, cranial nerves are intact, there are no motor or sensory deficits.  ED Treatments / Results   Radiology Dg Shoulder Right  Result Date: 09/17/2018 CLINICAL DATA:  Injury and pain. Right shoulder pain for 6-8 weeks. EXAM: RIGHT SHOULDER - 2+ VIEW COMPARISON:  None. FINDINGS: There is no evidence of fracture or dislocation. Mild glenohumeral and acromioclavicular degenerative spurring. Incidental bone island in the humeral head. Soft tissues are unremarkable. IMPRESSION: Mild glenohumeral and acromioclavicular degenerative spurring. No acute osseous abnormality. Electronically Signed   By: Keith Rake M.D.   On: 09/17/2018 21:33    Procedures Procedures   Medications Ordered  in ED Medications - No data to display   Initial Impression / Assessment and Plan / ED Course  I have reviewed the triage vital signs and the nursing notes.  Pertinent imaging results that were available during my care of the patient were reviewed by me and considered in my medical decision making (see chart for details).  Right shoulder pain following an injury without direct trauma to the shoulder.  X-rays are obtained and shows some  degenerative changes but no fracture or dislocation.  I suspect rotator cuff injury but specific diagnosis is not clear at this time.  She will be referred to orthopedics.  Meantime, she is given a sling for comfort and is advised to use ice instead of heat.  Continue using ibuprofen and acetaminophen but also given prescription for hydrocodone-acetaminophen to use at night so she can sleep.  Old records are reviewed, and she has no relevant past visits.  She has no prior narcotic prescriptions on record on the [place] controlled substance reporting website.  Final Clinical Impressions(s) / ED Diagnoses   Final diagnoses:  Right shoulder pain, unspecified chronicity  Elevated blood pressure reading with diagnosis of hypertension    ED Discharge Orders         Ordered    HYDROcodone-acetaminophen (NORCO) 5-325 MG tablet  Every 4 hours PRN     09/17/18 2355    HYDROcodone-acetaminophen (NORCO) 5-325 MG tablet  Every 4 hours PRN     09/17/18 [id number]           [name], MD [id number]

## 2018-09-20 MED FILL — Hydrocodone-Acetaminophen Tab 5-325 MG: ORAL | Qty: 6 | Status: AC

## 2018-12-10 ENCOUNTER — Other Ambulatory Visit: Payer: Self-pay | Admitting: Family Medicine

## 2018-12-14 ENCOUNTER — Other Ambulatory Visit: Payer: Self-pay | Admitting: Family Medicine

## 2018-12-26 ENCOUNTER — Other Ambulatory Visit: Payer: Self-pay | Admitting: Family Medicine

## 2018-12-28 ENCOUNTER — Other Ambulatory Visit: Payer: Self-pay

## 2018-12-29 ENCOUNTER — Other Ambulatory Visit: Payer: Self-pay | Admitting: Family Medicine

## 2018-12-29 ENCOUNTER — Ambulatory Visit: Payer: Self-pay | Admitting: Family Medicine

## 2018-12-29 ENCOUNTER — Other Ambulatory Visit: Payer: Self-pay

## 2018-12-29 MED ORDER — CARVEDILOL 6.25 MG PO TABS: ORAL_TABLET | ORAL | 0 refills | Status: DC

## 2018-12-29 MED ORDER — AMLODIPINE BESYLATE 10 MG PO TABS: ORAL_TABLET | ORAL | 0 refills | Status: DC

## 2019-02-15 ENCOUNTER — Other Ambulatory Visit: Payer: Self-pay

## 2019-02-16 ENCOUNTER — Ambulatory Visit (INDEPENDENT_AMBULATORY_CARE_PROVIDER_SITE_OTHER): Payer: Self-pay | Admitting: Family Medicine

## 2019-02-16 ENCOUNTER — Encounter: Payer: Self-pay | Admitting: Family Medicine

## 2019-02-16 VITALS — BP 133/91 | HR 92 | Temp 97.3°F | Ht 65.0 in | Wt 225.8 lb

## 2019-02-16 DIAGNOSIS — E876 Hypokalemia: Secondary | ICD-10-CM

## 2019-02-16 DIAGNOSIS — I10 Essential (primary) hypertension: Secondary | ICD-10-CM

## 2019-02-16 DIAGNOSIS — R600 Localized edema: Secondary | ICD-10-CM

## 2019-02-16 DIAGNOSIS — L720 Epidermal cyst: Secondary | ICD-10-CM

## 2019-02-16 DIAGNOSIS — F329 Major depressive disorder, single episode, unspecified: Secondary | ICD-10-CM

## 2019-02-16 DIAGNOSIS — M25511 Pain in right shoulder: Secondary | ICD-10-CM

## 2019-02-16 DIAGNOSIS — G8929 Other chronic pain: Secondary | ICD-10-CM

## 2019-02-16 DIAGNOSIS — F32A Depression, unspecified: Secondary | ICD-10-CM

## 2019-02-16 DIAGNOSIS — M542 Cervicalgia: Secondary | ICD-10-CM | POA: Insufficient documentation

## 2019-02-16 DIAGNOSIS — M25512 Pain in left shoulder: Secondary | ICD-10-CM

## 2019-02-16 MED ORDER — PREDNISONE 10 MG PO TABS: ORAL_TABLET | ORAL | 0 refills | Status: DC

## 2019-02-16 MED ORDER — CARVEDILOL 6.25 MG PO TABS: 9.3750 mg | ORAL_TABLET | Freq: Two times a day (BID) | ORAL | 5 refills | Status: DC

## 2019-02-16 MED ORDER — DULOXETINE HCL 60 MG PO CPEP: 60.0000 mg | ORAL_CAPSULE | Freq: Every day | ORAL | 3 refills | Status: DC

## 2019-02-16 MED ORDER — AMLODIPINE BESYLATE 10 MG PO TABS: 10.0000 mg | ORAL_TABLET | Freq: Every day | ORAL | 1 refills | Status: DC

## 2019-02-16 MED ORDER — POTASSIUM CHLORIDE CRYS ER 20 MEQ PO TBCR: EXTENDED_RELEASE_TABLET | ORAL | 1 refills | Status: DC

## 2019-02-16 MED ORDER — FUROSEMIDE 40 MG PO TABS: 40.0000 mg | ORAL_TABLET | Freq: Every day | ORAL | 1 refills | Status: DC

## 2019-02-16 MED ORDER — OLMESARTAN MEDOXOMIL 40 MG PO TABS: 40.0000 mg | ORAL_TABLET | Freq: Every day | ORAL | 1 refills | Status: DC

## 2019-02-16 MED ORDER — CYCLOBENZAPRINE HCL 10 MG PO TABS: 10.0000 mg | ORAL_TABLET | Freq: Every day | ORAL | 2 refills | Status: DC

## 2019-02-16 MED ORDER — FAMOTIDINE 20 MG PO TABS: 20.0000 mg | ORAL_TABLET | Freq: Two times a day (BID) | ORAL | 3 refills | Status: DC

## 2019-02-16 NOTE — Progress Notes (Signed)
Subjective:  Patient ID: Kelli Thompson, female    DOB: [birth year]  Age: 54 y.o. MRN: [id number]  CC: Hypertension, knot on wrist, spot on right side of face, and brown spot on left side of chest   HPI Kelli Thompson presents for follow-up of hypertension. Patient has no history of headache chest pain or shortness of breath or recent cough. Patient also denies symptoms of TIA such as numbness weakness lateralizing. Patient checks  blood pressure at home and has not had any elevated readings recently. Patient denies side effects from his medication. States taking it regularly. Patient has gained back all of her weight.  She describes 6 months of shoulder pain primarily in the posterior shoulders between the shoulder blades but radiating bilaterally into both upper extremities, right greater than left.  The pain from this has impacted her ability to work.  Particularly lifting overhead or pushing out for.  It has also caused her to get off track with exercise.  She was walking for some 9 holes and quit doing that.  This has contributed significantly to her gaining the weight back.  She would like to have a cervical MRI to rule out any disc disease causing the pain radiating into the upper extremities particularly on the right.  Patient relates that there is a lesion on the left wrist that is very painful.  It gets bumped a lot at work and it is very painful when bumped. Depression screen [facility] 2/9 02/16/2019 11/17/2017 10/20/2017  Decreased Interest 0 0 3  Down, Depressed, Hopeless 0 0 2  PHQ - 2 Score 0 0 5  Altered sleeping 3 - 0  Tired, decreased energy 0 - 0  Change in appetite 1 - 0  Feeling bad or failure about yourself  0 - 0  Trouble concentrating 0 - 0  Moving slowly or fidgety/restless 0 - 2  Suicidal thoughts 0 - 0  PHQ-9 Score 4 - 7  Difficult doing work/chores Not difficult at all - -    History Kelli Thompson has a past medical history of Anxiety, Hypertension, Pancreatitis,  Peri-menopause (02/28/2014), Urinary frequency (01/30/2014), and Urinary incontinence, mixed (01/30/2014).   She has a past surgical history that includes Cesarean section; Cholecystectomy; Appendectomy; and Abdominal hysterectomy.   Her family history includes Alcohol abuse in her paternal grandfather; Arthritis in her maternal grandmother; Asthma in her father; Cancer in her paternal grandmother; Congestive Heart Failure in her maternal grandmother; Diabetes in her maternal grandmother and mother; Hypertension in her father and mother; Other in her son; Parkinson's disease in her maternal grandfather; Stroke in her father.She reports that she quit smoking about 5 years ago. Her smoking use included cigarettes. She has a 15.00 pack-year smoking history. She has never used smokeless tobacco. She reports that she does not drink alcohol or use drugs.    ROS Review of Systems  Constitutional: Positive for activity change. Negative for fever.  HENT: Negative for congestion.   Eyes: Negative for visual disturbance.  Respiratory: Negative for shortness of breath.   Cardiovascular: Positive for palpitations (noted At night when the pain flares). Negative for chest pain.  Gastrointestinal: Negative for abdominal pain, constipation, diarrhea, nausea and vomiting.  Genitourinary: Negative for difficulty urinating.  Musculoskeletal: Positive for arthralgias and myalgias.  Neurological: Negative for headaches.  Psychiatric/Behavioral: Positive for dysphoric mood (at times. Describes marital tension - but happy overall). Negative for sleep disturbance. The patient is not nervous/anxious.     Objective:  BP (!) 133/91  Pulse 92   Temp (!) 97.3 F (36.3 C) (Temporal)   Ht 5' 5"  (1.651 m)   Wt 225 lb 12.8 oz (102.4 kg)   SpO2 97%   BMI 37.58 kg/m   BP Readings from Last 3 Encounters:  02/16/19 (!) 133/91  09/17/18 (!) 149/108  02/02/18 (!) 143/103    Wt Readings from Last 3 Encounters:  02/16/19  225 lb 12.8 oz (102.4 kg)  09/17/18 208 lb (94.3 kg)  02/02/18 209 lb (94.8 kg)     Physical Exam Constitutional:      General: She is not in acute distress.    Appearance: She is well-developed.  Cardiovascular:     Rate and Rhythm: Normal rate and regular rhythm.  Pulmonary:     Breath sounds: Normal breath sounds.  Skin:    General: Skin is warm and dry.     Findings: Lesion (Small epidermal inclusion cyst lying on top of the left ulnar styloid.  Tender to palpation.  Typical center.  Keratosis actually located at the right upper chest measuring 1.5 cm.  It is flat.  Tiny epidural inclusion cyst in front of the right ear.) present.  Neurological:     Mental Status: She is alert and oriented to person, place, and time.       Assessment & Plan:   Kelli Thompson was seen today for hypertension, knot on wrist, spot on right side of face and brown spot on left side of chest.  Diagnoses and all orders for this visit:  Essential hypertension -     amLODipine (NORVASC) 10 MG tablet; Take 1 tablet (10 mg total) by mouth daily. -     carvedilol (COREG) 6.25 MG tablet; Take 1.5 tablets (9.375 mg total) by mouth 2 (two) times daily with a meal. -     olmesartan (BENICAR) 40 MG tablet; Take 1 tablet (40 mg total) by mouth daily. For BP -     CBC with Differential/Platelet -     CMP14+EGFR -     Lipid panel  Hypokalemia -     potassium chloride SA (KLOR-CON) 20 MEQ tablet; TAKE ONE TABLET BY MOUTH DAILY FOR POTASSIUM -     CMP14+EGFR  Localized edema -     furosemide (LASIX) 40 MG tablet; Take 1 tablet (40 mg total) by mouth daily.  Depression, unspecified depression type -     DULoxetine (CYMBALTA) 60 MG capsule; Take 1 capsule (60 mg total) by mouth daily with supper.  Cervicalgia -     MR CERVICAL SPINE WO CONTRAST; Future -     predniSONE (DELTASONE) 10 MG tablet; Take 5 daily for 3 days followed by 4,3,2 and 1 for 3 days each. -     cyclobenzaprine (FLEXERIL) 10 MG tablet; Take 1  tablet (10 mg total) by mouth at bedtime. To relax muscles -     Ambulatory referral to Physical Therapy  Epidermal inclusion cyst  Chronic pain of both shoulders -     predniSONE (DELTASONE) 10 MG tablet; Take 5 daily for 3 days followed by 4,3,2 and 1 for 3 days each. -     cyclobenzaprine (FLEXERIL) 10 MG tablet; Take 1 tablet (10 mg total) by mouth at bedtime. To relax muscles -     Ambulatory referral to Physical Therapy  Other orders -     famotidine (PEPCID) 20 MG tablet; Take 1 tablet (20 mg total) by mouth 2 (two) times daily.       I have discontinued  Kelli Thompson's HYDROcodone-acetaminophen and HYDROcodone-acetaminophen. I have also changed her amLODipine, carvedilol, and potassium chloride SA. Additionally, I am having her start on predniSONE and cyclobenzaprine. Lastly, I am having her maintain her multivitamin with minerals, Fish Oil-Cholecalciferol (FISH OIL + D3 PO), phentermine, DULoxetine, famotidine, furosemide, and olmesartan.  Allergies as of 02/16/2019      Reactions   Ace Inhibitors    pancreatitis      Medication List       Accurate as of February 16, 2019 11:48 AM. If you have any questions, ask your nurse or doctor.        STOP taking these medications   HYDROcodone-acetaminophen 5-325 MG tablet Commonly known as: Norco Stopped by: Claretta Fraise, MD     TAKE these medications   amLODipine 10 MG tablet Commonly known as: NORVASC Take 1 tablet (10 mg total) by mouth daily. What changed: See the new instructions. Changed by: Claretta Fraise, MD   carvedilol 6.25 MG tablet Commonly known as: COREG Take 1.5 tablets (9.375 mg total) by mouth 2 (two) times daily with a meal. What changed:   how much to take  how to take this  when to take this  additional instructions Changed by: Claretta Fraise, MD   cyclobenzaprine 10 MG tablet Commonly known as: FLEXERIL Take 1 tablet (10 mg total) by mouth at bedtime. To relax muscles Started by:  Claretta Fraise, MD   DULoxetine 60 MG capsule Commonly known as: CYMBALTA Take 1 capsule (60 mg total) by mouth daily with supper.   famotidine 20 MG tablet Commonly known as: PEPCID Take 1 tablet (20 mg total) by mouth 2 (two) times daily.   FISH OIL + D3 PO Take 1 capsule by mouth daily.   furosemide 40 MG tablet Commonly known as: LASIX Take 1 tablet (40 mg total) by mouth daily.   multivitamin with minerals Tabs tablet Take 1 tablet by mouth daily.   olmesartan 40 MG tablet Commonly known as: BENICAR Take 1 tablet (40 mg total) by mouth daily. For BP   phentermine 37.5 MG capsule Take 1 capsule (37.5 mg total) by mouth every morning.   potassium chloride SA 20 MEQ tablet Commonly known as: KLOR-CON TAKE ONE TABLET BY MOUTH DAILY FOR POTASSIUM   predniSONE 10 MG tablet Commonly known as: DELTASONE Take 5 daily for 3 days followed by 4,3,2 and 1 for 3 days each. Started by: Claretta Fraise, MD      Due to the elevated blood pressure noted to be diastolic only, I went ahead with a small increase in her carvedilol dose.  This was equally because of her concern about palpitations during the night.  She should continue her other medications as is and as noted above.  For the shoulder neck and arm pain went ahead with a prednisone pack to calm it down.  She should also go to physical therapy for that.  Since she is self-pay it is questionable whether she will go.  However since it is impacting her work and her exercise in the form of golf it was highly recommended.  We did discuss going back on the phentermine.  She needs to have the palpitations gone and the blood pressure under better control before we can renew that.  She also needs to consider a plan of maintenance to prevent weight from coming back.  With regard to the lesion on the left arm, it is harmless in the sense of malignancy.  However, it is very tender  overlying the ulnar styloid.  Therefore excision seems appropriate.   She will decide on that and schedule a time.  With discussion her depression is clearly stable and in remission.  However, she does have some marital conflict.  From discussion it seems that there is a diminished amount of empathy between the 2 of them.  Counseling suggested.  Patient did not express interest at this time.  We will continue to monitor.  Follow-up: Return in about 1 month (around 03/19/2019) for lesioin removal left wrist - 1/2 hour (Px 11421).  Claretta Fraise, M.D.

## 2019-03-02 ENCOUNTER — Other Ambulatory Visit: Payer: Self-pay

## 2019-03-02 ENCOUNTER — Encounter (HOSPITAL_COMMUNITY): Payer: Self-pay | Admitting: Occupational Therapy

## 2019-03-02 ENCOUNTER — Other Ambulatory Visit: Payer: Self-pay | Admitting: Family Medicine

## 2019-03-02 ENCOUNTER — Ambulatory Visit (HOSPITAL_COMMUNITY)
Admission: RE | Admit: 2019-03-02 | Discharge: 2019-03-02 | Disposition: A | Discharge status: Home / Self Care | Payer: Self-pay | Source: Ambulatory Visit | Attending: Family Medicine | Admitting: Family Medicine

## 2019-03-02 ENCOUNTER — Ambulatory Visit (HOSPITAL_COMMUNITY): Payer: Self-pay | Attending: Family Medicine | Admitting: Occupational Therapy

## 2019-03-02 DIAGNOSIS — M25511 Pain in right shoulder: Secondary | ICD-10-CM | POA: Insufficient documentation

## 2019-03-02 DIAGNOSIS — M542 Cervicalgia: Secondary | ICD-10-CM | POA: Insufficient documentation

## 2019-03-02 DIAGNOSIS — R29898 Other symptoms and signs involving the musculoskeletal system: Secondary | ICD-10-CM | POA: Insufficient documentation

## 2019-03-02 DIAGNOSIS — M25512 Pain in left shoulder: Secondary | ICD-10-CM | POA: Insufficient documentation

## 2019-03-02 DIAGNOSIS — M5000 Cervical disc disorder with myelopathy, unspecified cervical region: Secondary | ICD-10-CM

## 2019-03-02 DIAGNOSIS — G8929 Other chronic pain: Secondary | ICD-10-CM | POA: Insufficient documentation

## 2019-03-02 NOTE — Patient Instructions (Signed)
  1) Flexion Wall Stretch    Face wall, place affected handon wall in front of you. Slide hand up the wall  and lean body in towards the wall. Hold for 10 seconds. Repeat 3-5 times. 1-2 times/day.     2) Towel Stretch with Internal Rotation      Gently pull up (or to the side) your affected arm  behind your back with the assist of a towel. Hold 10 seconds, repeat 3-5 times. 1-2 times/day.      3) Corner Stretch    Stand at a corner of a wall, place your arms on the walls with elbows bent. Lean into the corner until a stretch is felt along the front of your chest and/or shoulders. Hold for 10 seconds. Repeat 3-5X, 1-2 times/day.    4) Scalene Stretch, Sitting  Repeat __10_ times per session. Do _1-2__ sessions per day.  Copyright  VHI. All rights reserved.  Scalene Stretch, Sitting   Sit, one hand tucked under hip on side to be stretched, other hand over top of head. Gently pull head to side and backwards. Hold __10-20_ seconds. For more stretch, lean body in direction of head pull. Repeat _3__ times per session. Do __2_ sessions per day.  Copyright  VHI. All rights reserved.  5) Side Bend, Standing   Stand, one hand grasping other arm above wrist behind body. Pull down while gently tilting head toward pulling side. Hold _10-20__ seconds. Repeat _3__ times per session. Do __2_ sessions per day.  Copyright  VHI. All rights reserved.  6) Levator Scapula Stretch, Sitting   Sit, one hand on same-side shoulder blade, other hand on head. Gently pull head down and away. Hold _10-20__ seconds. Repeat _3__ times per session. Do _2__ sessions per day.  Copyright  VHI. All rights reserved.  7) Lower Cervical / Upper Thoracic Stretch   Clasp hands together in front with arms extended. Gently pull shoulder blades apart and bend head forward. Hold _10-20___ seconds. Repeat _3___ times per set. Do __1__ sets per session. Do __2__ sessions per day.

## 2019-03-02 NOTE — Progress Notes (Signed)
Result discussed with patient by phone.  Referral to neurosurgery made.WS

## 2019-03-02 NOTE — Therapy (Signed)
[facility] [place], [place], [address] Phone: [phone]   Fax:  [phone]  Occupational Therapy Evaluation  Patient Details  Name: Kelli Thompson MRN: [id number] Date of Birth: [birth year] Referring Provider (OT): Dr. Claretta Fraise   Encounter Date: 03/02/2019  OT End of Session - 03/02/19 1142    Visit Number  1    Number of Visits  8    Date for OT Re-Evaluation  04/01/19    Authorization Type  Self-pay    OT Start Time  1042    OT Stop Time  1118    OT Time Calculation (min)  36 min    Activity Tolerance  Patient tolerated treatment well    Behavior During Therapy  [facility] for tasks assessed/performed       Past Medical History:  Diagnosis Date  . Anxiety   . Hypertension   . Pancreatitis   . Peri-menopause 02/28/2014  . Urinary frequency 01/30/2014  . Urinary incontinence, mixed 01/30/2014    Past Surgical History:  Procedure Laterality Date  . ABDOMINAL HYSTERECTOMY    . APPENDECTOMY    . CESAREAN SECTION     x2  . CHOLECYSTECTOMY      There were no vitals filed for this visit.  Subjective Assessment - 03/02/19 1135    Subjective   S: I've been having pain since July.    Pertinent History  Pt is a 54 y/o female presenting with neck pain and bilateral shoulder pain, R>L, present since July. 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 Pt describes pain going from her neck down to the right medial shoulder blade area, as well as her anterior shoulder and down the arm at times. Pt was referred to occupational therapy services for evaluation and treatment by Dr. Claretta Fraise.    Patient Stated Goals  To have less pain and be able to do my work.    Currently in Pain?  Yes    Pain Score  5     Pain Location  Shoulder    Pain Orientation  Right    Pain Descriptors / Indicators  Aching;Sore    Pain Type  Chronic pain    Pain Radiating Towards   shoulder blades and neck    Pain Onset  More than a month ago    Pain Frequency  Intermittent    Aggravating Factors   working, movement    Pain Relieving Factors  rest, heat, medications    Effect of Pain on Daily Activities  mod effect on ADLs    Multiple Pain Sites  No        [facility] OT Assessment - 03/02/19 1044      Assessment   Medical Diagnosis  cervicalgia, chronic bilateral shoulder pain    Referring Provider (OT)  Dr. Claretta Fraise    Onset Date/Surgical Date  07/30/18    Hand Dominance  Right    Prior Therapy  None      Precautions   Precautions  None      Restrictions   Weight Bearing Restrictions  No      Balance Screen   Has the patient fallen in the past 6 months  No      Prior Function   Level of Independence  Independent    Vocation  Full time employment    Arboriculturist work-bending over,  lifting, pulling, etc.     Leisure  crocheting      ADL   ADL comments  Pt is having difficulty with dressing, washing and fixing hair, sleeping, reaching overhead and behind back, turning head when driving. Reports difficulty opening bottles and has to get pliers sometimes.      Written Expression   Dominant Hand  Right      Cognition   Overall Cognitive Status  Within Functional Limits for tasks assessed      ROM / Strength   AROM / PROM / Strength  AROM;PROM;Strength      Palpation   Palpation comment  mod to max fascial restrictions in anterior shoulder, trapezius, and cervical regions      AROM   Overall AROM Comments  Assessed seated, er/IR adducted    AROM Assessment Site  Shoulder;Cervical    Right/Left Shoulder  Right;Left    Right Shoulder Flexion  166 Degrees    Right Shoulder ABduction  130 Degrees    Right Shoulder Internal Rotation  90 Degrees    Right Shoulder External Rotation  75 Degrees    Left Shoulder Flexion  166 Degrees    Left Shoulder ABduction  130 Degrees    Left Shoulder Internal Rotation  90 Degrees    Left  Shoulder External Rotation  80 Degrees    Cervical Flexion  45    Cervical Extension  45    Cervical - Right Side Bend  16    Cervical - Left Side Bend  31    Cervical - Right Rotation  70    Cervical - Left Rotation  42      PROM   PROM Assessment Site  Shoulder    Right/Left Shoulder  Right;Left    Right Shoulder Flexion  175 Degrees    Right Shoulder ABduction  180 Degrees    Right Shoulder Internal Rotation  90 Degrees    Right Shoulder External Rotation  80 Degrees    Left Shoulder Flexion  180 Degrees    Left Shoulder ABduction  180 Degrees    Left Shoulder Internal Rotation  90 Degrees    Left Shoulder External Rotation  90 Degrees      Strength   Overall Strength Comments  Assessed seated, er/IR adducted    Strength Assessment Site  Shoulder;Hand    Right/Left Shoulder  Right;Left    Right Shoulder Flexion  5/5    Right Shoulder ABduction  5/5    Right Shoulder Internal Rotation  5/5    Right Shoulder External Rotation  5/5    Left Shoulder Flexion  5/5    Left Shoulder ABduction  5/5    Left Shoulder Internal Rotation  5/5    Left Shoulder External Rotation  5/5    Right/Left hand  Right;Left    Right Hand Grip (lbs)  75    Left Hand Grip (lbs)  70                      OT Education - 03/02/19 1134    Education Details  cervical neck stretches, shoulder stretches    Person(s) Educated  Patient    Methods  Explanation;Demonstration;Handout    Comprehension  Verbalized understanding;Returned demonstration       OT Short Term Goals - 03/02/19 1248      OT SHORT TERM GOAL #1   Title  Pt will be provided with and educated on HEP to improve mobility required  for ADL completion.    Time  4    Period  Weeks    Status  New    Target Date  04/01/19      OT SHORT TERM GOAL #2   Title  Pt will decrease pain in neck and bilateral shoulders to 3/10 or less to improve ability to perform work tasks.    Time  4    Period  Weeks    Status  New      OT  SHORT TERM GOAL #3   Title  Pt will decrease fascial restrictions in neck and bilateral shoulders to minimal amounts or less to improve mobility required for functional reaching tasks.    Time  4    Period  Weeks    Status  New      OT SHORT TERM GOAL #4   Title  Pt will be educated on strategies, techniques, and tools to decrease muscle tightness and tension as well as improve postural stability limiting ability to complete ADLs and work tasks.    Time  4    Period  Weeks    Status  New      OT SHORT TERM GOAL #5   Title  Pt will increase bilateral shoulder A/ROM and cervical neck ROM to WNL to improve ability to perform driving tasks such as turning to look when changing lanes.    Time  4    Period  Weeks    Status  New               Plan - 03/02/19 1143    Clinical Impression Statement  A: Pt is a 54 y/o female presenting with neck and bilateral shoulder pain, R>L. Pt has hx of chronic shoulder pain, this has worsened since July when she moved too quickly at work one day. Pt with good ROM and strength, however experiences pain with movement, work tasks, and daily tasks. Pt describes pain radiating from neck down shoulder blade and sometimes to anterior shoulder. Pt goes for an MRI today and will call to schedule appts after she speaks with the MD about the results as she is self-pay.    OT Occupational Profile and History  Problem Focused Assessment - Including review of records relating to presenting problem    Occupational performance deficits (Please refer to evaluation for details):  ADL's;IADL's;Rest and Sleep;Work;Leisure    Body Structure / Function / Physical Skills  ADL;Endurance;UE functional use;Fascial restriction;Pain;ROM;IADL;Strength    Rehab Potential  Good    Clinical Decision Making  Limited treatment options, no task modification necessary    Comorbidities Affecting Occupational Performance:  None    Modification or Assistance to Complete Evaluation   No  modification of tasks or assist necessary to complete eval    OT Frequency  2x / week    OT Duration  4 weeks    OT Treatment/Interventions  Self-care/ADL training;Ultrasound;Patient/family education;Passive range of motion;Cryotherapy;Electrical Stimulation;Moist Heat;Therapeutic exercise;Manual Therapy;Therapeutic activities    Plan  P: Pt will benefit from skilled OT services to decrease pain and fascial restrictions, increase ROM and functional use of BUE. Treatment plan: myofascial release, manual techniques, P/ROM, A/ROM, shoulder stretches, scapular mobility/stability       Patient will benefit from skilled therapeutic intervention in order to improve the following deficits and impairments:   Body Structure / Function / Physical Skills: ADL, Endurance, UE functional use, Fascial restriction, Pain, ROM, IADL, Strength       Visit Diagnosis: Cervicalgia  Chronic left shoulder pain  Chronic right shoulder pain  Other symptoms and signs involving the musculoskeletal system    Problem List Patient Active Problem List   Diagnosis Date Noted  . Cervicalgia 02/16/2019  . Localized edema 04/30/2015  . Peri-menopause 02/28/2014  . Urinary incontinence, mixed 01/30/2014  . Hypokalemia 04/18/2013  . HTN (hypertension) 04/17/2013   Guadelupe Sabin, OTR/L  [phone] 03/02/2019, 12:58 PM  [facility] [address] [place], [place], [address] Phone: [phone]   Fax:  [phone]  Name: Kelli Thompson MRN: [id number] Date of Birth: Apr 18, 1965

## 2019-03-17 ENCOUNTER — Ambulatory Visit: Payer: Self-pay | Admitting: Family Medicine

## 2019-05-06 ENCOUNTER — Telehealth: Payer: Self-pay | Admitting: Family Medicine

## 2019-05-06 NOTE — Telephone Encounter (Signed)
  REFERRAL REQUEST Telephone Note 05/06/2019  What type of referral do you need? Orthopedic Surgery  Have you been seen at our office for this problem? Yes (Advise that they may need an appointment with their PCP before a referral can be done)  Is there a particular doctor or location that you prefer? [facility]  Patient notified that referrals can take up to a week or longer to process. If they haven't heard anything within a week they should call back and speak with the referral department.

## 2019-05-09 NOTE — Telephone Encounter (Signed)
Please review and advise.

## 2019-05-09 NOTE — Telephone Encounter (Signed)
Pt. Has open referral to neurosurgery. Please purse that. WS

## 2019-05-10 ENCOUNTER — Telehealth: Payer: Self-pay | Admitting: Family Medicine

## 2019-05-10 ENCOUNTER — Other Ambulatory Visit: Payer: Self-pay | Admitting: Family Medicine

## 2019-05-10 DIAGNOSIS — M5000 Cervical disc disorder with myelopathy, unspecified cervical region: Secondary | ICD-10-CM | POA: Insufficient documentation

## 2019-05-10 NOTE — Telephone Encounter (Signed)
Per Proficient and the Notes from [facility] - Patient verified she does have insurance but did not give information to them and they have not scheduled an apt due to see if patient's insurance is accepted - LM for Patient to call office and verify insurance.

## 2019-07-08 DIAGNOSIS — M4802 Spinal stenosis, cervical region: Secondary | ICD-10-CM | POA: Insufficient documentation

## 2019-07-08 DIAGNOSIS — G8929 Other chronic pain: Secondary | ICD-10-CM | POA: Insufficient documentation

## 2019-08-25 ENCOUNTER — Ambulatory Visit (INDEPENDENT_AMBULATORY_CARE_PROVIDER_SITE_OTHER): Payer: 59 | Admitting: Family Medicine

## 2019-09-07 ENCOUNTER — Encounter (HOSPITAL_COMMUNITY): Payer: Self-pay | Admitting: Occupational Therapy

## 2019-09-07 NOTE — Therapy (Signed)
[facility] [place], [place], [address] Phone: [phone]   Fax:  [phone]  Patient Details  Name: Kelli Thompson MRN: [id number] Date of Birth: 1965-06-10 Referring Provider:  No ref. provider found  Encounter Date: 09/07/2019   OCCUPATIONAL THERAPY DISCHARGE SUMMARY  Visits from Start of Care: 1  Current functional level related to goals / functional outcomes: Unknown. Pt did not return for visits after the evaluation.    Remaining deficits: Unknown.    Education / Equipment: HEP at evaluation Plan: Patient agrees to discharge.  Patient goals were not met. Patient is being discharged due to not returning since the last visit.  ?????      Guadelupe Sabin, OTR/L  [phone] 09/07/2019, 11:01 AM  [facility] [place], [place], [address] Phone: [phone]   Fax:  [phone]

## 2019-09-29 ENCOUNTER — Other Ambulatory Visit: Payer: Self-pay | Admitting: Family Medicine

## 2019-09-29 DIAGNOSIS — I10 Essential (primary) hypertension: Secondary | ICD-10-CM

## 2019-10-05 ENCOUNTER — Other Ambulatory Visit: Payer: Self-pay | Admitting: Family Medicine

## 2019-10-05 DIAGNOSIS — R600 Localized edema: Secondary | ICD-10-CM

## 2019-10-05 DIAGNOSIS — E876 Hypokalemia: Secondary | ICD-10-CM

## 2019-11-08 ENCOUNTER — Other Ambulatory Visit: Payer: Self-pay | Admitting: Family Medicine

## 2019-11-08 DIAGNOSIS — I10 Essential (primary) hypertension: Secondary | ICD-10-CM

## 2019-11-25 ENCOUNTER — Other Ambulatory Visit: Payer: Self-pay | Admitting: Family Medicine

## 2019-11-25 DIAGNOSIS — E876 Hypokalemia: Secondary | ICD-10-CM

## 2019-11-25 DIAGNOSIS — I10 Essential (primary) hypertension: Secondary | ICD-10-CM

## 2019-11-29 ENCOUNTER — Encounter: Payer: Self-pay | Admitting: Family Medicine

## 2019-11-29 ENCOUNTER — Telehealth: Payer: Self-pay | Admitting: *Deleted

## 2019-11-29 ENCOUNTER — Other Ambulatory Visit: Payer: Self-pay

## 2019-11-29 ENCOUNTER — Ambulatory Visit: Payer: 59 | Admitting: Family Medicine

## 2019-11-29 ENCOUNTER — Telehealth: Payer: Self-pay

## 2019-11-29 VITALS — BP 155/105 | HR 82 | Temp 97.6°F | Resp 20 | Ht 65.0 in | Wt 235.0 lb

## 2019-11-29 DIAGNOSIS — I10 Essential (primary) hypertension: Secondary | ICD-10-CM | POA: Diagnosis not present

## 2019-11-29 DIAGNOSIS — R600 Localized edema: Secondary | ICD-10-CM | POA: Diagnosis not present

## 2019-11-29 DIAGNOSIS — E876 Hypokalemia: Secondary | ICD-10-CM

## 2019-11-29 DIAGNOSIS — Z1211 Encounter for screening for malignant neoplasm of colon: Secondary | ICD-10-CM

## 2019-11-29 DIAGNOSIS — Z23 Encounter for immunization: Secondary | ICD-10-CM

## 2019-11-29 DIAGNOSIS — F32A Depression, unspecified: Secondary | ICD-10-CM

## 2019-11-29 DIAGNOSIS — Z1159 Encounter for screening for other viral diseases: Secondary | ICD-10-CM

## 2019-11-29 DIAGNOSIS — Z1231 Encounter for screening mammogram for malignant neoplasm of breast: Secondary | ICD-10-CM

## 2019-11-29 DIAGNOSIS — Z114 Encounter for screening for human immunodeficiency virus [HIV]: Secondary | ICD-10-CM

## 2019-11-29 MED ORDER — FAMOTIDINE 20 MG PO TABS
20.0000 mg | ORAL_TABLET | Freq: Two times a day (BID) | ORAL | 3 refills | Status: DC
Start: 2019-11-29 — End: 2020-08-06

## 2019-11-29 MED ORDER — DULOXETINE HCL 60 MG PO CPEP: 60.0000 mg | ORAL_CAPSULE | Freq: Every day | ORAL | 1 refills | Status: DC

## 2019-11-29 MED ORDER — AMLODIPINE BESYLATE 10 MG PO TABS: ORAL_TABLET | ORAL | 0 refills | Status: DC

## 2019-11-29 MED ORDER — PREGABALIN 50 MG PO CAPS: ORAL_CAPSULE | ORAL | 0 refills | Status: DC

## 2019-11-29 MED ORDER — POTASSIUM CHLORIDE CRYS ER 20 MEQ PO TBCR: EXTENDED_RELEASE_TABLET | ORAL | 3 refills | Status: DC

## 2019-11-29 MED ORDER — OZEMPIC (0.25 OR 0.5 MG/DOSE) 2 MG/1.5ML ~~LOC~~ SOPN: 0.5000 mg | PEN_INJECTOR | SUBCUTANEOUS | 1 refills | Status: DC

## 2019-11-29 MED ORDER — CARVEDILOL 12.5 MG PO TABS: ORAL_TABLET | ORAL | 1 refills | Status: DC

## 2019-11-29 MED ORDER — OLMESARTAN MEDOXOMIL 40 MG PO TABS: 40.0000 mg | ORAL_TABLET | Freq: Every day | ORAL | 0 refills | Status: DC

## 2019-11-29 MED ORDER — FUROSEMIDE 40 MG PO TABS: 40.0000 mg | ORAL_TABLET | Freq: Every day | ORAL | 0 refills | Status: DC

## 2019-11-29 NOTE — Telephone Encounter (Signed)
PA in process for pregabalin  Key: [id number]  [employer] has received your information, and the request will be reviewed. You may close this dialog, return to your dashboard, and perform other tasks.  You will receive an electronic determination in CoverMyMeds. You can see the latest determination by locating this request on your dashboard or by reopening this request. You will also receive a faxed copy of the determination. If you have any questions please contact [employer] at [phone].  If you need assistance, please chat with CoverMyMeds or call us at [phone].

## 2019-11-29 NOTE — Progress Notes (Signed)
Subjective:  Patient ID: Kelli Thompson, female    DOB: 05-20-1965  Age: 54 y.o. MRN: [id number]  CC: Medication Refill   HPI Kelli Thompson presents for  follow-up of hypertension. Patient has no history of headache chest pain or shortness of breath or recent cough. Patient also denies symptoms of TIA such as focal numbness or weakness. Patient denies side effects from medication. States taking it regularly.  Blood pressure is remaining a bit high.  Patient states that she is having 8+/10 neck pain.  Posterior neck.  She has trouble getting out of bed she has trouble sleeping when she goes to bed.  She just cannot get comfortable.  She has been told she needs surgery but they will not do the surgery until she loses some weight.  Unfortunately she is unable to lose weight with multiple programs so far including low-carb with Pentair.  She is interested in [place] today.  She would also be interested in Lyrica for controlling the pain itself.  Of note is that the pain is so severe it prevents the exercise she needs to lose weight so that she can get the surgery to reduce the pain which would allow her to exercise and lose weight.  In short she is caught in a conundrum.  History Ashayla has a past medical history of Anxiety, Hypertension, Pancreatitis, Peri-menopause (02/28/2014), Urinary frequency (01/30/2014), and Urinary incontinence, mixed (01/30/2014).   She has a past surgical history that includes Cesarean section; Cholecystectomy; Appendectomy; and Abdominal hysterectomy.   Her family history includes Alcohol abuse in her paternal grandfather; Arthritis in her maternal grandmother; Asthma in her father; Cancer in her paternal grandmother; Congestive Heart Failure in her maternal grandmother; Diabetes in her maternal grandmother and mother; Hypertension in her father and mother; Other in her son; Parkinson's disease in her maternal grandfather; Stroke in her father.She reports that she quit  smoking about 6 years ago. Her smoking use included cigarettes. She has a 15.00 pack-year smoking history. She has never used smokeless tobacco. She reports that she does not drink alcohol and does not use drugs.  Current Outpatient Medications on File Prior to Visit  Medication Sig Dispense Refill  . cyclobenzaprine (FLEXERIL) 10 MG tablet Take 1 tablet (10 mg total) by mouth at bedtime. To relax muscles (Patient not taking: Reported on 11/29/2019) 30 tablet 2  . Fish Oil-Cholecalciferol (FISH OIL + D3 PO) Take 1 capsule by mouth daily. (Patient not taking: Reported on 11/29/2019)    . Multiple Vitamin (MULTIVITAMIN WITH MINERALS) TABS tablet Take 1 tablet by mouth daily. (Patient not taking: Reported on 11/29/2019)    . phentermine 37.5 MG capsule Take 1 capsule (37.5 mg total) by mouth every morning. (Patient not taking: Reported on 02/16/2019) 30 capsule 5   No current facility-administered medications on file prior to visit.    ROS Review of Systems  Constitutional: Positive for activity change.  HENT: Negative.   Eyes: Negative for visual disturbance.  Respiratory: Negative for shortness of breath.   Cardiovascular: Negative for chest pain.  Gastrointestinal: Negative for abdominal pain.  Musculoskeletal: Positive for arthralgias and neck pain. Negative for joint swelling.    Objective:  BP (!) 155/105   Pulse 82   Temp 97.6 F (36.4 C) (Temporal)   Resp 20   Ht _0  (1.651 m)   Wt 235 lb (106.6 kg)   SpO2 96%   BMI 39.11 kg/m   BP Readings from Last 3 Encounters:  11/29/19 Marland Kitchen)  155/105  02/16/19 (!) 133/91  09/17/18 (!) 149/108    Wt Readings from Last 3 Encounters:  11/29/19 235 lb (106.6 kg)  02/16/19 225 lb 12.8 oz (102.4 kg)  09/17/18 208 lb (94.3 kg)     Physical Exam Constitutional:      General: She is not in acute distress.    Appearance: She is well-developed.  Cardiovascular:     Rate and Rhythm: Normal rate and regular rhythm.  Pulmonary:      Breath sounds: Normal breath sounds.  Musculoskeletal:        General: Tenderness (Posterior neck) present. No deformity.     Right lower leg: No edema.     Left lower leg: No edema.  Skin:    General: Skin is warm and dry.  Neurological:     Mental Status: She is alert and oriented to person, place, and time.     Cranial Nerves: No cranial nerve deficit.       Assessment & Plan:   Zylee was seen today for medication refill.  Diagnoses and all orders for this visit:  Essential hypertension -     olmesartan (BENICAR) 40 MG tablet; Take 1 tablet (40 mg total) by mouth daily. -     carvedilol (COREG) 12.5 MG tablet; 1 po BID -     amLODipine (NORVASC) 10 MG tablet; TAKE ONE TABLET BY MOUTH DAILY FOR BLOOD PRESSURE -     CBC with Differential/Platelet -     CMP14+EGFR -     Lipid panel  Hypokalemia -     potassium chloride SA (KLOR-CON M20) 20 MEQ tablet; TAKE ONE TABLET BY MOUTH DAILY FOR POTASSIUM -     CBC with Differential/Platelet -     CMP14+EGFR -     Hepatitis C antibody  Localized edema -     furosemide (LASIX) 40 MG tablet; Take 1 tablet (40 mg total) by mouth daily. -     CBC with Differential/Platelet -     CMP14+EGFR  Depression, unspecified depression type -     DULoxetine (CYMBALTA) 60 MG capsule; Take 1 capsule (60 mg total) by mouth daily with supper. -     CBC with Differential/Platelet -     CMP14+EGFR  Need for immunization against influenza -     Flu Vaccine QUAD 36+ mos IM -     CMP14+EGFR  Need for hepatitis C screening test -     HIV Antibody (routine testing w rflx)  Encounter for screening for HIV  Screen for colon cancer -     Ambulatory referral to Gastroenterology  Encounter for screening mammogram for malignant neoplasm of breast -     MM 3D SCREEN BREAST BILATERAL; Future  Other orders -     famotidine (PEPCID) 20 MG tablet; Take 1 tablet (20 mg total) by mouth 2 (two) times daily. -     pregabalin (LYRICA) 50 MG capsule; 1 qhs  X7 days , then 2 qhs X 7d, then 3 qhs X 7d, then 4 qhs to reduce neck pain -     Semaglutide,0.25 or 0.5MG/DOS, (OZEMPIC, 0.25 OR 0.5 MG/DOSE,) 2 MG/1.5ML SOPN; Inject 0.5 mg into the skin once a week.   Allergies as of 11/29/2019      Reactions   Ace Inhibitors    pancreatitis      Medication List       Accurate as of November 29, 2019  1:56 PM. If you have any questions, ask your nurse  or doctor.        STOP taking these medications   predniSONE 10 MG tablet Commonly known as: DELTASONE Stopped by: Claretta Fraise, MD     TAKE these medications   amLODipine 10 MG tablet Commonly known as: NORVASC TAKE ONE TABLET BY MOUTH DAILY FOR BLOOD PRESSURE   carvedilol 12.5 MG tablet Commonly known as: COREG 1 po BID What changed:   medication strength  See the new instructions. Changed by: Claretta Fraise, MD   cyclobenzaprine 10 MG tablet Commonly known as: FLEXERIL Take 1 tablet (10 mg total) by mouth at bedtime. To relax muscles   DULoxetine 60 MG capsule Commonly known as: CYMBALTA Take 1 capsule (60 mg total) by mouth daily with supper.   famotidine 20 MG tablet Commonly known as: PEPCID Take 1 tablet (20 mg total) by mouth 2 (two) times daily.   FISH OIL + D3 PO Take 1 capsule by mouth daily.   furosemide 40 MG tablet Commonly known as: LASIX Take 1 tablet (40 mg total) by mouth daily. What changed: additional instructions Changed by: Claretta Fraise, MD   multivitamin with minerals Tabs tablet Take 1 tablet by mouth daily.   olmesartan 40 MG tablet Commonly known as: BENICAR Take 1 tablet (40 mg total) by mouth daily.   Ozempic (0.25 or 0.5 MG/DOSE) 2 MG/1.5ML Sopn Generic drug: Semaglutide(0.25 or 0.5MG /DOS) Inject 0.5 mg into the skin once a week. Started by: Claretta Fraise, MD   phentermine 37.5 MG capsule Take 1 capsule (37.5 mg total) by mouth every morning.   potassium chloride SA 20 MEQ tablet Commonly known as: [place]-Con M20 TAKE ONE TABLET BY  MOUTH DAILY FOR POTASSIUM   pregabalin 50 MG capsule Commonly known as: Lyrica 1 qhs X7 days , then 2 qhs X 7d, then 3 qhs X 7d, then 4 qhs to reduce neck pain Started by: Claretta Fraise, MD       Meds ordered this encounter  Medications  . potassium chloride SA (KLOR-CON M20) 20 MEQ tablet    Sig: TAKE ONE TABLET BY MOUTH DAILY FOR POTASSIUM    Dispense:  30 tablet    Refill:  3  . olmesartan (BENICAR) 40 MG tablet    Sig: Take 1 tablet (40 mg total) by mouth daily.    Dispense:  90 tablet    Refill:  0  . furosemide (LASIX) 40 MG tablet    Sig: Take 1 tablet (40 mg total) by mouth daily.    Dispense:  90 tablet    Refill:  0  . famotidine (PEPCID) 20 MG tablet    Sig: Take 1 tablet (20 mg total) by mouth 2 (two) times daily.    Dispense:  180 tablet    Refill:  3  . DULoxetine (CYMBALTA) 60 MG capsule    Sig: Take 1 capsule (60 mg total) by mouth daily with supper.    Dispense:  90 capsule    Refill:  1  . carvedilol (COREG) 12.5 MG tablet    Sig: 1 po BID    Dispense:  180 tablet    Refill:  1  . amLODipine (NORVASC) 10 MG tablet    Sig: TAKE ONE TABLET BY MOUTH DAILY FOR BLOOD PRESSURE    Dispense:  90 tablet    Refill:  0  . pregabalin (LYRICA) 50 MG capsule    Sig: 1 qhs X7 days , then 2 qhs X 7d, then 3 qhs X 7d, then 4 qhs to  reduce neck pain    Dispense:  120 capsule    Refill:  0  . Semaglutide,0.25 or 0.5MG/DOS, (OZEMPIC, 0.25 OR 0.5 MG/DOSE,) 2 MG/1.5ML SOPN    Sig: Inject 0.5 mg into the skin once a week.    Dispense:  1.5 mL    Refill:  1    Hopefully Ozempic combined with low-carb will break the cycle of the weight  pain/pain, weight combined with the Lyrica to help with the pain itself.  Follow-up: Return in about 1 month (around 12/29/2019).  Claretta Fraise, M.D.

## 2019-11-29 NOTE — Telephone Encounter (Signed)
Regarding referral for Colonoscopy: Pt would like to someone to call her at work when there is more info available for colonoscopy referral.   [phone]

## 2019-11-30 LAB — CBC WITH DIFFERENTIAL/PLATELET
Basophils Absolute: 0 10*3/uL (ref 0.0–0.2)
Basos: 0 %
EOS (ABSOLUTE): 0.5 10*3/uL — ABNORMAL HIGH (ref 0.0–0.4)
Eos: 6 %
Hematocrit: 40.7 % (ref 34.0–46.6)
Hemoglobin: 13.5 g/dL (ref 11.1–15.9)
Immature Grans (Abs): 0 10*3/uL (ref 0.0–0.1)
Immature Granulocytes: 0 %
Lymphocytes Absolute: 2.1 10*3/uL (ref 0.7–3.1)
Lymphs: 28 %
MCH: 29.3 pg (ref 26.6–33.0)
MCHC: 33.2 g/dL (ref 31.5–35.7)
MCV: 89 fL (ref 79–97)
Monocytes Absolute: 0.5 10*3/uL (ref 0.1–0.9)
Monocytes: 7 %
Neutrophils Absolute: 4.4 10*3/uL (ref 1.4–7.0)
Neutrophils: 59 %
Platelets: 229 10*3/uL (ref 150–450)
RBC: 4.6 x10E6/uL (ref 3.77–5.28)
RDW: 14 % (ref 11.7–15.4)
WBC: 7.5 10*3/uL (ref 3.4–10.8)

## 2019-11-30 LAB — CMP14+EGFR
ALT: 22 IU/L (ref 0–32)
AST: 18 IU/L (ref 0–40)
Albumin/Globulin Ratio: 1.7 (ref 1.2–2.2)
Albumin: 4.3 g/dL (ref 3.8–4.9)
Alkaline Phosphatase: 109 IU/L (ref 44–121)
BUN/Creatinine Ratio: 18 (ref 9–23)
BUN: 19 mg/dL (ref 6–24)
Bilirubin Total: 0.2 mg/dL (ref 0.0–1.2)
CO2: 27 mmol/L (ref 20–29)
Calcium: 10.2 mg/dL (ref 8.7–10.2)
Chloride: 105 mmol/L (ref 96–106)
Creatinine, Ser: 1.08 mg/dL — ABNORMAL HIGH (ref 0.57–1.00)
GFR calc Af Amer: 68 mL/min/{1.73_m2} (ref >59)
GFR calc non Af Amer: 59 mL/min/{1.73_m2} — ABNORMAL LOW (ref >59)
Globulin, Total: 2.5 g/dL (ref 1.5–4.5)
Glucose: 96 mg/dL (ref 65–99)
Potassium: 4.4 mmol/L (ref 3.5–5.2)
Sodium: 144 mmol/L (ref 134–144)
Total Protein: 6.8 g/dL (ref 6.0–8.5)

## 2019-11-30 LAB — HIV ANTIBODY (ROUTINE TESTING W REFLEX): HIV Screen 4th Generation wRfx: NONREACTIVE

## 2019-11-30 LAB — HEPATITIS C ANTIBODY: Hep C Virus Ab: <0.1 s/co ratio (ref 0.0–0.9)

## 2019-11-30 LAB — LIPID PANEL
Chol/HDL Ratio: 4.8 ratio — ABNORMAL HIGH (ref 0.0–4.4)
Cholesterol, Total: 160 mg/dL (ref 100–199)
HDL: 33 mg/dL — ABNORMAL LOW (ref >39)
LDL Chol Calc (NIH): 105 mg/dL — ABNORMAL HIGH (ref 0–99)
Triglycerides: 118 mg/dL (ref 0–149)
VLDL Cholesterol Cal: 22 mg/dL (ref 5–40)

## 2019-11-30 NOTE — Progress Notes (Signed)
Hello Kelli Thompson,  Your lab result is normal and/or stable.Some minor variations that are not significant are commonly marked abnormal, but do not represent any medical problem for you.  Best regards, Claretta Fraise, M.D.

## 2019-12-01 NOTE — Telephone Encounter (Signed)
PA for pregabalin denied. Patient must fail gabapentin.

## 2019-12-02 ENCOUNTER — Other Ambulatory Visit: Payer: Self-pay | Admitting: Family Medicine

## 2019-12-02 MED ORDER — GABAPENTIN 300 MG PO CAPS: ORAL_CAPSULE | ORAL | 0 refills | Status: DC

## 2019-12-02 NOTE — Telephone Encounter (Signed)
I sent in gabapentin for her to try

## 2019-12-05 NOTE — Telephone Encounter (Signed)
Patient aware and verbalizes understanding. 

## 2019-12-06 ENCOUNTER — Encounter: Payer: Self-pay | Admitting: Internal Medicine

## 2019-12-26 ENCOUNTER — Other Ambulatory Visit: Payer: Self-pay

## 2019-12-26 ENCOUNTER — Ambulatory Visit
Admission: RE | Admit: 2019-12-26 | Discharge: 2019-12-26 | Disposition: A | Discharge status: Home / Self Care | Payer: Self-pay | Source: Ambulatory Visit | Attending: Family Medicine | Admitting: Family Medicine

## 2019-12-26 DIAGNOSIS — Z1231 Encounter for screening mammogram for malignant neoplasm of breast: Secondary | ICD-10-CM

## 2019-12-28 ENCOUNTER — Ambulatory Visit: Payer: 59 | Admitting: Family Medicine

## 2020-01-02 ENCOUNTER — Other Ambulatory Visit: Payer: Self-pay | Admitting: Family Medicine

## 2020-01-02 DIAGNOSIS — R928 Other abnormal and inconclusive findings on diagnostic imaging of breast: Secondary | ICD-10-CM

## 2020-01-07 ENCOUNTER — Other Ambulatory Visit: Payer: Self-pay | Admitting: Family Medicine

## 2020-01-12 ENCOUNTER — Other Ambulatory Visit: Payer: Self-pay

## 2020-01-12 ENCOUNTER — Ambulatory Visit
Admission: RE | Admit: 2020-01-12 | Discharge: 2020-01-12 | Disposition: A | Discharge status: Home / Self Care | Payer: 59 | Source: Ambulatory Visit | Attending: Family Medicine | Admitting: Family Medicine

## 2020-01-12 ENCOUNTER — Other Ambulatory Visit: Payer: Self-pay | Admitting: Family Medicine

## 2020-01-12 DIAGNOSIS — R928 Other abnormal and inconclusive findings on diagnostic imaging of breast: Secondary | ICD-10-CM

## 2020-01-12 NOTE — Progress Notes (Signed)
Referring Provider: Claretta Fraise, MD Primary Care Physician:  Claretta Fraise, MD Primary Gastroenterologist:  Dr. Abbey Chatters  Chief Complaint  Patient presents with  . Colonoscopy    Never had tcs. No fhcrc    HPI:   Kelli Thompson is a 54 y.o. female presenting today at the request of Claretta Fraise, MD for colon cancer screening.   No prior colonoscopy. No family history of colon cancer. No abdominal pain, constipation, diarrhea, brbpr, or melena. No nausea, vomiting. A couple weeks ago, she had diarrhea for about 1 week. Had several watery stools. This was an acute illness. Associated fatigue during this time. Prior to onset, she had eaten out at a [employer]. No history of diarrhea. Symptoms have resolved. BMs back to baseline with soft, formed BMs every 2-3 days. No GERD symptoms or dysphagia.   May be resuming Lyrica in the near future.    Past Medical History:  Diagnosis Date  . Anxiety   . Hypertension   . Pancreatitis    around 2013; x1 secondary to lisinopril per patient  . Peri-menopause 02/28/2014  . Urinary frequency 01/30/2014  . Urinary incontinence, mixed 01/30/2014    Past Surgical History:  Procedure Laterality Date  . ABDOMINAL HYSTERECTOMY    . APPENDECTOMY    . CESAREAN SECTION     x2  . CHOLECYSTECTOMY      Current Outpatient Medications  Medication Sig Dispense Refill  . amLODipine (NORVASC) 10 MG tablet TAKE ONE TABLET BY MOUTH DAILY FOR BLOOD PRESSURE 90 tablet 0  . carvedilol (COREG) 12.5 MG tablet 1 po BID 180 tablet 1  . DULoxetine (CYMBALTA) 60 MG capsule Take 1 capsule (60 mg total) by mouth daily with supper. 90 capsule 1  . famotidine (PEPCID) 20 MG tablet Take 1 tablet (20 mg total) by mouth 2 (two) times daily. 180 tablet 3  . furosemide (LASIX) 40 MG tablet Take 1 tablet (40 mg total) by mouth daily. 90 tablet 0  . olmesartan (BENICAR) 40 MG tablet Take 1 tablet (40 mg total) by mouth daily. 90 tablet 0  . potassium chloride SA  (KLOR-CON M20) 20 MEQ tablet TAKE ONE TABLET BY MOUTH DAILY FOR POTASSIUM 30 tablet 3  . Semaglutide,0.25 or 0.5MG /DOS, (OZEMPIC, 0.25 OR 0.5 MG/DOSE,) 2 MG/1.5ML SOPN Inject 0.5 mg into the skin once a week. 1.5 mL 1   No current facility-administered medications for this visit.    Allergies as of 01/13/2020 - Review Complete 01/13/2020  Allergen Reaction Noted  . Ace inhibitors  03/28/2015    Family History  Problem Relation Age of Onset  . Hypertension Mother   . Diabetes Mother        borderline  . Hypertension Father   . Asthma Father   . Stroke Father   . Diabetes Maternal Grandmother   . Congestive Heart Failure Maternal Grandmother   . Arthritis Maternal Grandmother   . Parkinson's disease Maternal Grandfather   . Cancer Paternal Grandmother   . Alcohol abuse Paternal Grandfather   . Other Son        stomach issues  . Colon cancer Neg Hx     Social History   Socioeconomic History  . Marital status: Married    Spouse name: Not on file  . Number of children: Not on file  . Years of education: Not on file  . Highest education level: Not on file  Occupational History  . Not on file  Tobacco Use  . Smoking status:  Former Smoker    Packs/day: 0.50    Years: 30.00    Pack years: 15.00    Types: Cigarettes    Quit date: 03/27/2013    Years since quitting: 6.8  . Smokeless tobacco: Never Used  Vaping Use  . Vaping Use: Never used  Substance and Sexual Activity  . Alcohol use: No  . Drug use: No  . Sexual activity: Yes    Birth control/protection: Surgical    Comment: hyst  Other Topics Concern  . Not on file  Social History Narrative  . Not on file   Social Determinants of Health   Financial Resource Strain: Not on file  Food Insecurity: Not on file  Transportation Needs: Not on file  Physical Activity: Not on file  Stress: Not on file  Social Connections: Not on file  Intimate Partner Violence: Not on file    Review of Systems: Gen: Denies any  fever, chills, cold or flulike symptoms, lightheadedness, dizziness, presyncope, syncope. CV: Denies chest pain or heart palpitations. Resp: Denies shortness of breath or cough. GI: See HPI MS: Reports chronic neck pain. Derm: Denies rash Psych: History of anxiety. Doing well with medications.  Heme: See HPI  Physical Exam: BP 124/89   Pulse 83   Temp (!) 96.8 F (36 C) (Temporal)   Ht 5\' 5"  (1.651 m)   Wt 228 lb (103.4 kg)   BMI 37.94 kg/m  General:   Alert and oriented. Pleasant and cooperative. Well-nourished and well-developed.  Head:  Normocephalic and atraumatic. Eyes:  Without icterus, sclera clear and conjunctiva pink.  Ears:  Normal auditory acuity. Lungs:  Clear to auscultation bilaterally. No wheezes, rales, or rhonchi. No distress.  Heart:  S1, S2 present without murmurs appreciated.  Abdomen:  +BS, soft, non-tender and non-distended. No HSM noted. No guarding or rebound. No masses appreciated.  Rectal:  Deferred  Msk:  Symmetrical without gross deformities. Normal posture. Pulses:  Normal pulses noted. Extremities:  With 1+ pitting edema in LE.  Neurologic:  Alert and  oriented x4;  grossly normal neurologically. Skin:  Intact without significant lesions or rashes. Psych:  Normal mood and affect.

## 2020-01-12 NOTE — H&P (View-Only) (Signed)
Referring Provider: Claretta Fraise, MD Primary Care Physician:  Claretta Fraise, MD Primary Gastroenterologist:  Dr. Abbey Chatters  Chief Complaint  Patient presents with  . Colonoscopy    Never had tcs. No fhcrc    HPI:   Kelli Thompson is a 54 y.o. female presenting today at the request of Claretta Fraise, MD for colon cancer screening.   No prior colonoscopy. No family history of colon cancer. No abdominal pain, constipation, diarrhea, brbpr, or melena. No nausea, vomiting. A couple weeks ago, she had diarrhea for about 1 week. Had several watery stools. This was an acute illness. Associated fatigue during this time. Prior to onset, she had eaten out at a [employer]. No history of diarrhea. Symptoms have resolved. BMs back to baseline with soft, formed BMs every 2-3 days. No GERD symptoms or dysphagia.   May be resuming Lyrica in the near future.    Past Medical History:  Diagnosis Date  . Anxiety   . Hypertension   . Pancreatitis    around 2013; x1 secondary to lisinopril per patient  . Peri-menopause 02/28/2014  . Urinary frequency 01/30/2014  . Urinary incontinence, mixed 01/30/2014    Past Surgical History:  Procedure Laterality Date  . ABDOMINAL HYSTERECTOMY    . APPENDECTOMY    . CESAREAN SECTION     x2  . CHOLECYSTECTOMY      Current Outpatient Medications  Medication Sig Dispense Refill  . amLODipine (NORVASC) 10 MG tablet TAKE ONE TABLET BY MOUTH DAILY FOR BLOOD PRESSURE 90 tablet 0  . carvedilol (COREG) 12.5 MG tablet 1 po BID 180 tablet 1  . DULoxetine (CYMBALTA) 60 MG capsule Take 1 capsule (60 mg total) by mouth daily with supper. 90 capsule 1  . famotidine (PEPCID) 20 MG tablet Take 1 tablet (20 mg total) by mouth 2 (two) times daily. 180 tablet 3  . furosemide (LASIX) 40 MG tablet Take 1 tablet (40 mg total) by mouth daily. 90 tablet 0  . olmesartan (BENICAR) 40 MG tablet Take 1 tablet (40 mg total) by mouth daily. 90 tablet 0  . potassium chloride SA  (KLOR-CON M20) 20 MEQ tablet TAKE ONE TABLET BY MOUTH DAILY FOR POTASSIUM 30 tablet 3  . Semaglutide,0.25 or 0.5MG /DOS, (OZEMPIC, 0.25 OR 0.5 MG/DOSE,) 2 MG/1.5ML SOPN Inject 0.5 mg into the skin once a week. 1.5 mL 1   No current facility-administered medications for this visit.    Allergies as of 01/13/2020 - Review Complete 01/13/2020  Allergen Reaction Noted  . Ace inhibitors  03/28/2015    Family History  Problem Relation Age of Onset  . Hypertension Mother   . Diabetes Mother        borderline  . Hypertension Father   . Asthma Father   . Stroke Father   . Diabetes Maternal Grandmother   . Congestive Heart Failure Maternal Grandmother   . Arthritis Maternal Grandmother   . Parkinson's disease Maternal Grandfather   . Cancer Paternal Grandmother   . Alcohol abuse Paternal Grandfather   . Other Son        stomach issues  . Colon cancer Neg Hx     Social History   Socioeconomic History  . Marital status: Married    Spouse name: Not on file  . Number of children: Not on file  . Years of education: Not on file  . Highest education level: Not on file  Occupational History  . Not on file  Tobacco Use  . Smoking status:  Former Smoker    Packs/day: 0.50    Years: 30.00    Pack years: 15.00    Types: Cigarettes    Quit date: 03/27/2013    Years since quitting: 6.8  . Smokeless tobacco: Never Used  Vaping Use  . Vaping Use: Never used  Substance and Sexual Activity  . Alcohol use: No  . Drug use: No  . Sexual activity: Yes    Birth control/protection: Surgical    Comment: hyst  Other Topics Concern  . Not on file  Social History Narrative  . Not on file   Social Determinants of Health   Financial Resource Strain: Not on file  Food Insecurity: Not on file  Transportation Needs: Not on file  Physical Activity: Not on file  Stress: Not on file  Social Connections: Not on file  Intimate Partner Violence: Not on file    Review of Systems: Gen: Denies any  fever, chills, cold or flulike symptoms, lightheadedness, dizziness, presyncope, syncope. CV: Denies chest pain or heart palpitations. Resp: Denies shortness of breath or cough. GI: See HPI MS: Reports chronic neck pain. Derm: Denies rash Psych: History of anxiety. Doing well with medications.  Heme: See HPI  Physical Exam: BP 124/89   Pulse 83   Temp (!) 96.8 F (36 C) (Temporal)   Ht 5\' 5"  (1.651 m)   Wt 228 lb (103.4 kg)   BMI 37.94 kg/m  General:   Alert and oriented. Pleasant and cooperative. Well-nourished and well-developed.  Head:  Normocephalic and atraumatic. Eyes:  Without icterus, sclera clear and conjunctiva pink.  Ears:  Normal auditory acuity. Lungs:  Clear to auscultation bilaterally. No wheezes, rales, or rhonchi. No distress.  Heart:  S1, S2 present without murmurs appreciated.  Abdomen:  +BS, soft, non-tender and non-distended. No HSM noted. No guarding or rebound. No masses appreciated.  Rectal:  Deferred  Msk:  Symmetrical without gross deformities. Normal posture. Pulses:  Normal pulses noted. Extremities:  With 1+ pitting edema in LE.  Neurologic:  Alert and  oriented x4;  grossly normal neurologically. Skin:  Intact without significant lesions or rashes. Psych:  Normal mood and affect.

## 2020-01-13 ENCOUNTER — Encounter: Payer: Self-pay | Admitting: Gastroenterology

## 2020-01-13 ENCOUNTER — Ambulatory Visit (INDEPENDENT_AMBULATORY_CARE_PROVIDER_SITE_OTHER): Payer: 59 | Admitting: Gastroenterology

## 2020-01-13 ENCOUNTER — Other Ambulatory Visit: Payer: Self-pay

## 2020-01-13 DIAGNOSIS — Z1211 Encounter for screening for malignant neoplasm of colon: Secondary | ICD-10-CM | POA: Diagnosis not present

## 2020-01-13 NOTE — Assessment & Plan Note (Signed)
54 year old female with history of HTN, obesity, and anxiety presenting today to schedule first ever screening colonoscopy.  No significant upper or lower GI symptoms.  No alarm symptoms.  No family history of colon cancer.  Plan: Proceed with colonoscopy with propofol with Dr. Abbey Chatters in the near future.The risks, benefits, and alternatives have been discussed with the patient in detail. The patient states understanding and desires to proceed.  ASA II Follow-up as needed/per Dr. Abbey Chatters.  Advised to call with any new GI concerns.

## 2020-01-13 NOTE — Progress Notes (Signed)
Cc'ed to pcp °

## 2020-01-13 NOTE — Patient Instructions (Addendum)
We will get you scheduled for colonoscopy in the near future with Dr. Abbey Chatters.  As you are not having any GI problems at this time, we will follow up with you as Dr. Abbey Chatters recommends.  If you have any new GI concerns, do not hesitate to give [place] a call.  It was nice meeting you today!  I hope you have a great Christmas!  Aliene Altes, PA-C [facility]

## 2020-01-18 ENCOUNTER — Ambulatory Visit
Admission: RE | Admit: 2020-01-18 | Discharge: 2020-01-18 | Disposition: A | Discharge status: Home / Self Care | Payer: 59 | Source: Ambulatory Visit | Attending: Family Medicine | Admitting: Family Medicine

## 2020-01-18 ENCOUNTER — Other Ambulatory Visit: Payer: Self-pay

## 2020-01-18 DIAGNOSIS — R928 Other abnormal and inconclusive findings on diagnostic imaging of breast: Secondary | ICD-10-CM

## 2020-01-18 HISTORY — PX: BREAST EXCISIONAL BIOPSY: SUR124

## 2020-01-30 ENCOUNTER — Telehealth: Payer: Self-pay | Admitting: Internal Medicine

## 2020-01-30 ENCOUNTER — Other Ambulatory Visit: Payer: Self-pay

## 2020-01-30 ENCOUNTER — Ambulatory Visit (INDEPENDENT_AMBULATORY_CARE_PROVIDER_SITE_OTHER): Payer: 59 | Admitting: Family Medicine

## 2020-01-30 ENCOUNTER — Encounter: Payer: Self-pay | Admitting: Family Medicine

## 2020-01-30 DIAGNOSIS — Z6835 Body mass index (BMI) 35.0-35.9, adult: Secondary | ICD-10-CM

## 2020-01-30 DIAGNOSIS — M25512 Pain in left shoulder: Secondary | ICD-10-CM

## 2020-01-30 DIAGNOSIS — G8929 Other chronic pain: Secondary | ICD-10-CM

## 2020-01-30 DIAGNOSIS — N649 Disorder of breast, unspecified: Secondary | ICD-10-CM

## 2020-01-30 DIAGNOSIS — M25511 Pain in right shoulder: Secondary | ICD-10-CM

## 2020-01-30 DIAGNOSIS — I10 Essential (primary) hypertension: Secondary | ICD-10-CM

## 2020-01-30 MED ORDER — PREGABALIN 200 MG PO CAPS: 200.0000 mg | ORAL_CAPSULE | Freq: Every day | ORAL | 2 refills | Status: DC

## 2020-01-30 NOTE — Telephone Encounter (Signed)
Appt letter and procedure instructions sent to MyChart.

## 2020-01-30 NOTE — Progress Notes (Signed)
Subjective:    Patient ID: Kelli Thompson, female    DOB: November 05, 1965, 55 y.o.   MRN: [id number]   HPI: Kelli Thompson is a 55 y.o. female presenting for recheck of neuropathy. She got good relief from the lyrica, but ran out several days ago. Now symptoms are returnong. She was able to titrate to the full 200 mg dose without significant side effects including drowsiness or dizziness.   She took the Ozempic, but it made her nauseous. She discontinued it. Meanwhile she has a colonoscopy in 3 days and has a breast lesion excision surgery scheduled within the next two weeks. The lesion was bengin but represented a possible hamartoma, so excisional bx was recommended.    Depression screen [facility] 2/9 11/29/2019 02/16/2019 11/17/2017 10/20/2017 02/11/2017  Decreased Interest 0 0 0 3 0  Down, Depressed, Hopeless 0 0 0 2 0  PHQ - 2 Score 0 0 0 5 0  Altered sleeping - 3 - 0 -  Tired, decreased energy - 0 - 0 -  Change in appetite - 1 - 0 -  Feeling bad or failure about yourself  - 0 - 0 -  Trouble concentrating - 0 - 0 -  Moving slowly or fidgety/restless - 0 - 2 -  Suicidal thoughts - 0 - 0 -  PHQ-9 Score - 4 - 7 -  Difficult doing work/chores - Not difficult at all - - -     Relevant past medical, surgical, family and social history reviewed and updated as indicated.  Interim medical history since our last visit reviewed. Allergies and medications reviewed and updated.  ROS:  Review of Systems  Constitutional: Negative.   HENT: Negative.   Eyes: Negative for visual disturbance.  Respiratory: Negative for shortness of breath.   Cardiovascular: Negative for chest pain.  Gastrointestinal: Negative for abdominal pain.     Social History   Tobacco Use  Smoking Status Former Smoker  . Packs/day: 0.50  . Years: 30.00  . Pack years: 15.00  . Types: Cigarettes  . Quit date: 03/27/2013  . Years since quitting: 6.8  Smokeless Tobacco Never Used       Objective:     Wt Readings  from Last 3 Encounters:  01/13/20 228 lb (103.4 kg)  11/29/19 235 lb (106.6 kg)  02/16/19 225 lb 12.8 oz (102.4 kg)     Exam deferred. Pt. Harboring due to COVID 19. Phone visit performed.   Assessment & Plan:   1. Chronic pain of both shoulders   2. Essential hypertension   3. Breast lesion   4. Class 2 severe obesity due to excess calories with serious comorbidity and body mass index (BMI) of 35.0 to 35.9 in adult [facility])     Meds ordered this encounter  Medications  . pregabalin (LYRICA) 200 MG capsule    Sig: Take 1 capsule (200 mg total) by mouth at bedtime.    Dispense:  30 capsule    Refill:  2    No orders of the defined types were placed in this encounter.     Diagnoses and all orders for this visit:  Chronic pain of both shoulders  Essential hypertension  Breast lesion  Class 2 severe obesity due to excess calories with serious comorbidity and body mass index (BMI) of 35.0 to 35.9 in adult [facility])  Other orders -     pregabalin (LYRICA) 200 MG capsule; Take 1 capsule (200 mg total) by mouth at bedtime.  Virtual Visit via telephone Note  I discussed the limitations, risks, security and privacy concerns of performing an evaluation and management service by telephone and the availability of in person appointments. The patient was identified with two identifiers. Pt.expressed understanding and agreed to proceed. Pt. Is at home. Dr. Livia Snellen is in his office.  Follow Up Instructions:   I discussed the assessment and treatment plan with the patient. The patient was provided an opportunity to ask questions and all were answered. The patient agreed with the plan and demonstrated an understanding of the instructions.   The patient was advised to call back or seek an in-person evaluation if the symptoms worsen or if the condition fails to improve as anticipated.   Total minutes including chart review and phone contact time: 21   Follow up plan: Return in about 2  weeks (around 02/13/2020).  Claretta Fraise, MD [place]

## 2020-01-30 NOTE — Telephone Encounter (Signed)
PATIENT NEEDS HER INSTRUCTIONS SENT TO HER MYCHART

## 2020-02-01 ENCOUNTER — Other Ambulatory Visit: Payer: Self-pay

## 2020-02-01 ENCOUNTER — Other Ambulatory Visit (HOSPITAL_COMMUNITY)
Admission: RE | Admit: 2020-02-01 | Discharge: 2020-02-01 | Disposition: A | Discharge status: Home / Self Care | Payer: 59 | Source: Ambulatory Visit | Attending: Internal Medicine | Admitting: Internal Medicine

## 2020-02-01 DIAGNOSIS — Z01812 Encounter for preprocedural laboratory examination: Secondary | ICD-10-CM | POA: Diagnosis present

## 2020-02-01 DIAGNOSIS — Z20822 Contact with and (suspected) exposure to covid-19: Secondary | ICD-10-CM | POA: Diagnosis not present

## 2020-02-01 LAB — SARS CORONAVIRUS 2 (TAT 6-24 HRS): SARS Coronavirus 2: NEGATIVE

## 2020-02-02 ENCOUNTER — Other Ambulatory Visit: Payer: Self-pay

## 2020-02-02 ENCOUNTER — Ambulatory Visit (HOSPITAL_COMMUNITY): Payer: 59 | Admitting: Anesthesiology

## 2020-02-02 ENCOUNTER — Encounter (HOSPITAL_COMMUNITY)
Admission: RE | Disposition: A | Discharge status: Home / Self Care | Payer: Self-pay | Source: Home / Self Care | Attending: Internal Medicine

## 2020-02-02 ENCOUNTER — Encounter (HOSPITAL_COMMUNITY): Payer: Self-pay

## 2020-02-02 ENCOUNTER — Ambulatory Visit (HOSPITAL_COMMUNITY)
Admission: RE | Admit: 2020-02-02 | Discharge: 2020-02-02 | Disposition: A | Discharge status: Home / Self Care | Payer: 59 | Attending: Internal Medicine | Admitting: Internal Medicine

## 2020-02-02 DIAGNOSIS — K5730 Diverticulosis of large intestine without perforation or abscess without bleeding: Secondary | ICD-10-CM | POA: Diagnosis not present

## 2020-02-02 DIAGNOSIS — Z79899 Other long term (current) drug therapy: Secondary | ICD-10-CM | POA: Diagnosis not present

## 2020-02-02 DIAGNOSIS — Z888 Allergy status to other drugs, medicaments and biological substances status: Secondary | ICD-10-CM | POA: Insufficient documentation

## 2020-02-02 DIAGNOSIS — K648 Other hemorrhoids: Secondary | ICD-10-CM | POA: Diagnosis not present

## 2020-02-02 DIAGNOSIS — Z1211 Encounter for screening for malignant neoplasm of colon: Secondary | ICD-10-CM

## 2020-02-02 DIAGNOSIS — Z87891 Personal history of nicotine dependence: Secondary | ICD-10-CM | POA: Diagnosis not present

## 2020-02-02 HISTORY — PX: COLONOSCOPY WITH PROPOFOL: SHX5780

## 2020-02-02 SURGERY — COLONOSCOPY WITH PROPOFOL
Anesthesia: General

## 2020-02-02 MED ORDER — LIDOCAINE HCL (CARDIAC) PF 100 MG/5ML IV SOSY
PREFILLED_SYRINGE | INTRAVENOUS | Status: DC | PRN
  Administered 2020-02-02: 50 mg via INTRAVENOUS

## 2020-02-02 MED ORDER — PROPOFOL 10 MG/ML IV BOLUS
INTRAVENOUS | Status: DC | PRN
  Administered 2020-02-02: 30 mg via INTRAVENOUS
  Administered 2020-02-02: 50 mg via INTRAVENOUS
  Administered 2020-02-02: 100 mg via INTRAVENOUS
  Administered 2020-02-02: 20 mg via INTRAVENOUS

## 2020-02-02 MED ORDER — LACTATED RINGERS IV SOLN
INTRAVENOUS | Status: DC
  Administered 2020-02-02: 1000 mL via INTRAVENOUS

## 2020-02-02 NOTE — Interval H&P Note (Signed)
History and Physical Interval Note:  02/02/2020 8:10 AM  Kelli Thompson  has presented today for surgery, with the diagnosis of colon cancer screening.  The various methods of treatment have been discussed with the patient and family. After consideration of risks, benefits and other options for treatment, the patient has consented to  Procedure(s) with comments: COLONOSCOPY WITH PROPOFOL (N/A) - 8:15am as a surgical intervention.  The patient's history has been reviewed, patient examined, no change in status, stable for surgery.  I have reviewed the patient's chart and labs.  Questions were answered to the patient's satisfaction.     Lanelle Bal

## 2020-02-02 NOTE — Discharge Instructions (Signed)
  Colonoscopy Discharge Instructions  Read the instructions outlined below and refer to this sheet in the next few weeks. These discharge instructions provide you with general information on caring for yourself after you leave the hospital. Your doctor may also give you specific instructions. While your treatment has been planned according to the most current medical practices available, unavoidable complications occasionally occur.   ACTIVITY  You may resume your regular activity, but move at a slower pace for the next 24 hours.   Take frequent rest periods for the next 24 hours.   Walking will help get rid of the air and reduce the bloated feeling in your belly (abdomen).   No driving for 24 hours (because of the medicine (anesthesia) used during the test).    Do not sign any important legal documents or operate any machinery for 24 hours (because of the anesthesia used during the test).  NUTRITION  Drink plenty of fluids.   You may resume your normal diet as instructed by your doctor.   Begin with a light meal and progress to your normal diet. Heavy or fried foods are harder to digest and may make you feel sick to your stomach (nauseated).   Avoid alcoholic beverages for 24 hours or as instructed.  MEDICATIONS  You may resume your normal medications unless your doctor tells you otherwise.  WHAT YOU CAN EXPECT TODAY  Some feelings of bloating in the abdomen.   Passage of more gas than usual.   Spotting of blood in your stool or on the toilet paper.  IF YOU HAD POLYPS REMOVED DURING THE COLONOSCOPY:  No aspirin products for 7 days or as instructed.   No alcohol for 7 days or as instructed.   Eat a soft diet for the next 24 hours.  FINDING OUT THE RESULTS OF YOUR TEST Not all test results are available during your visit. If your test results are not back during the visit, make an appointment with your caregiver to find out the results. Do not assume everything is normal if  you have not heard from your caregiver or the medical facility. It is important for you to follow up on all of your test results.  SEEK IMMEDIATE MEDICAL ATTENTION IF:  You have more than a spotting of blood in your stool.   Your belly is swollen (abdominal distention).   You are nauseated or vomiting.   You have a temperature over 101.   You have abdominal pain or discomfort that is severe or gets worse throughout the day.   Your colonoscopy was relatively unremarkable.  I did not find any evidence of polyps or colon cancer. You do have diverticulosis and internal hemorrhoids. I would recommend increasing fiber in your diet or adding OTC Benefiber/Metamucil. Be sure to drink at least 4 to 6 glasses of water daily. Follow-up with GI as needed.   I hope you have a great rest of your week!  Hennie Duos. Marletta Lor, D.O. Gastroenterology and Hepatology [facility]

## 2020-02-02 NOTE — Op Note (Signed)
[facility] Patient Name: Kelli Thompson Procedure Date: 02/02/2020 8:06 AM MRN: [id number] Date of Birth: 07-28-1965 Attending MD: Elon Alas. Edgar Frisk CSN: [id number] Age: 55 Admit Type: Outpatient Procedure:                Colonoscopy Indications:              Screening for colorectal malignant neoplasm Providers:                Elon Alas. Airyana Sprunger, DO, Otis Peak B. Sharon Seller, RN,                            Caprice Kluver, Nelma Rothman, Technician Referring MD:              Medicines:                See the Anesthesia note for documentation of the                            administered medications Complications:            No immediate complications. Estimated Blood Loss:     Estimated blood loss: none. Procedure:                Pre-Anesthesia Assessment:                           - The anesthesia plan was to use monitored                            anesthesia care (MAC).                           After obtaining informed consent, the colonoscope                            was passed under direct vision. Throughout the                            procedure, the patient's blood pressure, pulse, and                            oxygen saturations were monitored continuously. The                            [id number]-HQ190L ([id number]) scope was introduced through                            the anus and advanced to the the cecum, identified                            by appendiceal orifice and ileocecal valve. The                            colonoscopy was performed without difficulty. The                            patient tolerated the  procedure well. The quality                            of the bowel preparation was evaluated using the                            BBPS [facility] Bowel Preparation Scale) with scores                            of: Right Colon = 2 (minor amount of residual                            staining, small fragments of stool and/or opaque                            liquid, but  mucosa seen well), Transverse Colon = 3                            (entire mucosa seen well with no residual staining,                            small fragments of stool or opaque liquid) and Left                            Colon = 3 (entire mucosa seen well with no residual                            staining, small fragments of stool or opaque                            liquid). The total BBPS score equals 8. The quality                            of the bowel preparation was good. Scope In: 8:21:43 AM Scope Out: 8:34:47 AM Scope Withdrawal Time: 0 hours 7 minutes 54 seconds  Total Procedure Duration: 0 hours 13 minutes 4 seconds  Findings:      The perianal and digital rectal examinations were normal.      Non-bleeding internal hemorrhoids were found during endoscopy.      Many small and large-mouthed diverticula were found in the sigmoid       colon, descending colon and transverse colon.      The exam was otherwise without abnormality. Impression:               - Non-bleeding internal hemorrhoids.                           - Diverticulosis in the sigmoid colon, in the                            descending colon and in the transverse colon.                           - The examination was  otherwise normal.                           - No specimens collected. Moderate Sedation:      Per Anesthesia Care Recommendation:           - Patient has a contact number available for                            emergencies. The signs and symptoms of potential                            delayed complications were discussed with the                            patient. Return to normal activities tomorrow.                            Written discharge instructions were provided to the                            patient.                           - Resume previous diet.                           - Continue present medications.                           - Repeat colonoscopy in 10 years for screening                             purposes.                           - Return to GI clinic PRN. Procedure Code(s):        --- Professional ---                           T5974, Colorectal cancer screening; colonoscopy on                            individual not meeting criteria for high risk Diagnosis Code(s):        --- Professional ---                           Z12.11, Encounter for screening for malignant                            neoplasm of colon                           K64.8, Other hemorrhoids                           K57.30, Diverticulosis of large intestine without  perforation or abscess without bleeding CPT copyright 2019 American Medical Association. All rights reserved. The codes documented in this report are preliminary and upon coder review may  be revised to meet current compliance requirements. Elon Alas. Abbey Chatters, DO [place] Abbey Chatters, DO 02/02/2020 8:37:51 AM This report has been signed electronically. Number of Addenda: 0

## 2020-02-02 NOTE — Anesthesia Postprocedure Evaluation (Signed)
Anesthesia Post Note  Patient: Kelli Thompson  Procedure(s) Performed: COLONOSCOPY WITH PROPOFOL (N/A )  Patient location during evaluation: Endoscopy Anesthesia Type: General Level of consciousness: awake and alert and oriented Pain management: pain level controlled Vital Signs Assessment: post-procedure vital signs reviewed and stable Respiratory status: spontaneous breathing, nonlabored ventilation and respiratory function stable Cardiovascular status: blood pressure returned to baseline and stable Postop Assessment: no apparent nausea or vomiting Anesthetic complications: no   No complications documented.   Last Vitals:  Vitals:   02/02/20 0839 02/02/20 0840  BP:  101/60  Pulse: 74   Resp: 17   Temp: 36.6 C   SpO2:      Last Pain:  Vitals:   02/02/20 0839  TempSrc: Oral  PainSc: 0-No pain                 Julian Reil

## 2020-02-02 NOTE — Transfer of Care (Signed)
Immediate Anesthesia Transfer of Care Note  Patient: Kelli Thompson  Procedure(s) Performed: COLONOSCOPY WITH PROPOFOL (N/A )  Patient Location: Endoscopy Unit  Anesthesia Type:General  Level of Consciousness: awake, alert  and oriented  Airway & Oxygen Therapy: Patient Spontanous Breathing  Post-op Assessment: Report given to RN and Post -op Vital signs reviewed and stable  Post vital signs: Reviewed and stable  Last Vitals:  Vitals Value Taken Time  BP    Temp    Pulse    Resp    SpO2      Last Pain:  Vitals:   02/02/20 0819  TempSrc:   PainSc: 0-No pain         Complications: No complications documented.

## 2020-02-02 NOTE — Anesthesia Preprocedure Evaluation (Signed)
Anesthesia Evaluation  Patient identified by MRN, date of birth, ID band Patient awake    Reviewed: Allergy & Precautions, H&P , NPO status , Patient's Chart, lab work & pertinent test results, reviewed documented beta blocker date and time   Airway Mallampati: II  TM Distance: >3 FB Neck ROM: full    Dental no notable dental hx. (+) Edentulous Lower, Edentulous Upper, Upper Dentures, Lower Dentures   Pulmonary neg pulmonary ROS, former smoker,    Pulmonary exam normal breath sounds clear to auscultation       Cardiovascular Exercise Tolerance: Good hypertension, negative cardio ROS   Rhythm:regular Rate:Normal     Neuro/Psych Anxiety negative neurological ROS  negative psych ROS   GI/Hepatic negative GI ROS, Neg liver ROS,   Endo/Other  negative endocrine ROS  Renal/GU negative Renal ROS  negative genitourinary   Musculoskeletal   Abdominal   Peds  Hematology negative hematology ROS (+)   Anesthesia Other Findings   Reproductive/Obstetrics negative OB ROS                             Anesthesia Physical  Anesthesia Plan  ASA: III  Anesthesia Plan: General   Post-op Pain Management:    Induction:   PONV Risk Score and Plan: Propofol infusion  Airway Management Planned:   Additional Equipment:   Intra-op Plan:   Post-operative Plan:   Informed Consent: I have reviewed the patients History and Physical, chart, labs and discussed the procedure including the risks, benefits and alternatives for the proposed anesthesia with the patient or authorized representative who has indicated his/her understanding and acceptance.     Dental Advisory Given  Plan Discussed with: CRNA  Anesthesia Plan Comments:         Anesthesia Quick Evaluation  

## 2020-02-02 NOTE — Anesthesia Procedure Notes (Signed)
Date/Time: 02/02/2020 8:24 AM Performed by: Julian Reil, CRNA Pre-anesthesia Checklist: Patient identified, Emergency Drugs available, Suction available and Patient being monitored Patient Re-evaluated:Patient Re-evaluated prior to induction Oxygen Delivery Method: Nasal cannula Induction Type: IV induction Placement Confirmation: positive ETCO2

## 2020-02-06 ENCOUNTER — Encounter (HOSPITAL_COMMUNITY): Payer: Self-pay | Admitting: Internal Medicine

## 2020-02-07 ENCOUNTER — Ambulatory Visit (INDEPENDENT_AMBULATORY_CARE_PROVIDER_SITE_OTHER): Payer: 59 | Admitting: General Surgery

## 2020-02-07 ENCOUNTER — Other Ambulatory Visit (HOSPITAL_COMMUNITY): Payer: Self-pay | Admitting: General Surgery

## 2020-02-07 ENCOUNTER — Other Ambulatory Visit (HOSPITAL_COMMUNITY): Payer: Self-pay | Admitting: Family Medicine

## 2020-02-07 ENCOUNTER — Other Ambulatory Visit: Payer: Self-pay

## 2020-02-07 ENCOUNTER — Encounter: Payer: Self-pay | Admitting: General Surgery

## 2020-02-07 ENCOUNTER — Other Ambulatory Visit: Payer: Self-pay | Admitting: Family Medicine

## 2020-02-07 VITALS — BP 139/89 | HR 88 | Temp 97.8°F | Resp 14 | Ht 65.0 in | Wt 235.0 lb

## 2020-02-07 DIAGNOSIS — R928 Other abnormal and inconclusive findings on diagnostic imaging of breast: Secondary | ICD-10-CM

## 2020-02-07 DIAGNOSIS — N6310 Unspecified lump in the right breast, unspecified quadrant: Secondary | ICD-10-CM

## 2020-02-07 NOTE — Progress Notes (Signed)
[facility] History and Physical  Reason for Referral: Right breast mass  Referring Physician:  [place]   Chief Complaint    New Patient (Initial Visit)      Kelli Thompson is a 55 y.o. female.  HPI: Kelli Thompson is a 55 yo who underwent her screening mammogram in November and then diagnostic and [place] with biopsy. Pathology has come back with concern for fibroepithelial lesion possibly Hamartoma but this is thought to be discordant by the Radiologist.  The patient has no history of any masses, lumps, bumps, nipple changes or discharge. She had menarche at age 55, and her first pregnancy at age 5. She is She has no history of any family breast cancer. She underwent menopause at 57 with a total hysterectomy per her report.    Past Medical History:  Diagnosis Date  . Anxiety   . Hypertension   . Pancreatitis    around 2013; x1 secondary to lisinopril per patient  . Peri-menopause 02/28/2014  . Urinary frequency 01/30/2014  . Urinary incontinence, mixed 01/30/2014    Past Surgical History:  Procedure Laterality Date  . ABDOMINAL HYSTERECTOMY    . APPENDECTOMY    . CESAREAN SECTION     x2  . CHOLECYSTECTOMY    . COLONOSCOPY WITH PROPOFOL N/A 02/02/2020   Procedure: COLONOSCOPY WITH PROPOFOL;  Surgeon: Eloise Harman, DO;  Location: [facility];  Service: Endoscopy;  Laterality: N/A;  8:15am    Family History  Problem Relation Age of Onset  . Hypertension Mother   . Diabetes Mother        borderline  . Hypertension Father   . Asthma Father   . Stroke Father   . Diabetes Maternal Grandmother   . Congestive Heart Failure Maternal Grandmother   . Arthritis Maternal Grandmother   . Parkinson's disease Maternal Grandfather   . Cancer Paternal Grandmother   . Alcohol abuse Paternal Grandfather   . Other Son        stomach issues  . Colon cancer Neg Hx     Social History   Tobacco Use  . Smoking status: Former Smoker    Packs/day: 0.50    Years:  30.00    Pack years: 15.00    Types: Cigarettes    Quit date: 03/27/2013    Years since quitting: 6.8  . Smokeless tobacco: Never Used  Vaping Use  . Vaping Use: Never used  Substance Use Topics  . Alcohol use: No  . Drug use: No    Medications: I have reviewed the patient's current medications. Allergies as of 02/07/2020      Reactions   Ace Inhibitors    pancreatitis      Medication List       Accurate as of February 07, 2020 11:38 AM. If you have any questions, ask your nurse or doctor.        amLODipine 10 MG tablet Commonly known as: NORVASC TAKE ONE TABLET BY MOUTH DAILY FOR BLOOD PRESSURE What changed:   how much to take  how to take this  when to take this   carvedilol 12.5 MG tablet Commonly known as: COREG 1 po BID What changed:   how much to take  how to take this  when to take this   DULoxetine 60 MG capsule Commonly known as: CYMBALTA Take 1 capsule (60 mg total) by mouth daily with supper.   famotidine 20 MG tablet Commonly known as: PEPCID Take 1 tablet (20  mg total) by mouth 2 (two) times daily.   furosemide 40 MG tablet Commonly known as: LASIX Take 1 tablet (40 mg total) by mouth daily.   olmesartan 40 MG tablet Commonly known as: BENICAR Take 1 tablet (40 mg total) by mouth daily.   potassium chloride SA 20 MEQ tablet Commonly known as: Klor-Con M20 TAKE ONE TABLET BY MOUTH DAILY FOR POTASSIUM What changed:   how much to take  how to take this  when to take this   pregabalin 200 MG capsule Commonly known as: Lyrica Take 1 capsule (200 mg total) by mouth at bedtime.        ROS:  A comprehensive review of systems was negative except for: Musculoskeletal: positive for neck pain and joint pain  Blood pressure 139/89, pulse 88, temperature 97.8 F (36.6 C), temperature source Other (Comment), resp. rate 14, height 5' 5"  (1.651 m), weight 235 lb (106.6 kg), SpO2 97 %. Physical Exam Vitals reviewed.  Constitutional:       Appearance: Normal appearance.  HENT:     Head: Normocephalic.     Nose: Nose normal.     Mouth/Throat:     Mouth: Mucous membranes are moist.  Eyes:     Extraocular Movements: Extraocular movements intact.  Cardiovascular:     Rate and Rhythm: Normal rate and regular rhythm.  Pulmonary:     Effort: Pulmonary effort is normal.     Breath sounds: Normal breath sounds.  Chest:  Breasts:     Right: No mass, nipple discharge, tenderness, axillary adenopathy or supraclavicular adenopathy.     Left: No mass, nipple discharge, skin change, tenderness, axillary adenopathy or supraclavicular adenopathy.      Comments: Bruising on right lateral breast Abdominal:     General: There is no distension.     Palpations: Abdomen is soft.     Tenderness: There is no abdominal tenderness.  Musculoskeletal:        General: Normal range of motion.     Cervical back: Normal range of motion.  Lymphadenopathy:     Upper Body:     Right upper body: No supraclavicular or axillary adenopathy.     Left upper body: No supraclavicular or axillary adenopathy.  Skin:    General: Skin is warm.  Neurological:     General: No focal deficit present.     Mental Status: She is alert and oriented to person, place, and time.  Psychiatric:        Mood and Affect: Mood normal.        Behavior: Behavior normal.        Thought Content: Thought content normal.        Judgment: Judgment normal.     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 This was found to be discordant by Dr. Franki Cabot, with excision recommended.  Pathology results were discussed with the patient by telephone. The patient reported doing well after the biopsy with tenderness, bleeding and bruising at the site. Post biopsy instructions and  care were reviewed and questions were answered. The patient was encouraged to call [facility] for any additional concerns. My direct phone number was provided.  A surgical referral is being arranged with [facility] in [place], [place], per patient request.  Pathology results reported by Terie Purser, RN on 01/25/2020.  Electronically Signed   By: Franki Cabot M.D.   On: 01/25/2020 09:59  CLINICAL DATA:  Patient with an indeterminate 5 mm mass in the RIGHT breast at 10 o'clock axis presents today for ultrasound-guided core biopsy  EXAM: ULTRASOUND GUIDED RIGHT BREAST CORE NEEDLE BIOPSY  COMPARISON:  Previous exam(s).  PROCEDURE: I met with the patient and we discussed the procedure of ultrasound-guided biopsy, including benefits and alternatives. We discussed the high likelihood of a successful procedure. We discussed the risks of the procedure, including infection, bleeding, tissue injury, clip migration, and inadequate sampling. Informed written consent was given. The usual time-out protocol was performed immediately prior to the procedure.  Lesion quadrant: Upper outer quadrant  Using sterile technique and 1% Lidocaine as local anesthetic, under direct ultrasound visualization, a 12 gauge spring-loaded device was used to perform biopsy of the 5 mm mass within the RIGHT breast at the 10 o'clock axis using a inferolateral approach. At the conclusion of the procedure ribbon shaped tissue marker clip was deployed into the biopsy cavity. Follow up 2 view mammogram was performed and dictated separately.  IMPRESSION: Ultrasound guided biopsy of the 5 mm mass in the RIGHT breast at the 10 o'clock axis. No apparent complications.  Electronically Signed: By: Franki Cabot M.D. On: 01/18/2020 14:28  CLINICAL DATA:  Screening recall for a possible right breast mass.  EXAM: DIGITAL DIAGNOSTIC UNILATERAL RIGHT  MAMMOGRAM WITH TOMO AND CAD; ULTRASOUND RIGHT BREAST LIMITED  COMPARISON:  Previous exam(s).  ACR Breast Density Category b: There are scattered areas of fibroglandular density.  FINDINGS: Spot compression tomosynthesis images through the upper outer right breast demonstrates oval mass measuring approximately 4 mm. This is adjacent to a larger stable lymph node which measures 8 mm.  Mammographic images were processed with CAD.  Ultrasound targeted to the right breast at 10 o'clock, 8 cm from the nipple demonstrates an irregular hypoechoic mass measuring 5 x 4 x 3 mm. Ultrasound of the right axilla demonstrates multiple normal-appearing lymph nodes.  IMPRESSION: 1. There is an indeterminate 5 mm mass in the right breast at 10 o'clock.  2.  No evidence of right axillary lymphadenopathy.  RECOMMENDATION: Ultrasound guided biopsy is recommended for the right breast mass. The procedure has been scheduled for 01/18/2020 at 1:45 p.m.  I have discussed the findings and recommendations with the patient. If applicable, a reminder letter will be sent to the patient regarding the next appointment.  BI-RADS CATEGORY  4: Suspicious.   Electronically Signed   By: Ammie Ferrier M.D.   On: 01/12/2020 12:57  CLINICAL DATA:  Screening.  EXAM: DIGITAL SCREENING BILATERAL MAMMOGRAM WITH TOMO AND CAD  COMPARISON:  Previous exam(s).  ACR Breast Density Category b: There are scattered areas of fibroglandular density.  FINDINGS: In the right breast, a possible mass warrants further evaluation. In the left breast, no findings suspicious for malignancy. Images were processed with CAD.  IMPRESSION: Further evaluation is suggested for a possible mass in the right breast.  RECOMMENDATION: Diagnostic mammogram and possibly ultrasound of the right breast. (Code:[id number]-[id number])  The patient will be contacted regarding the findings, and additional imaging will be  scheduled.  BI-RADS CATEGORY  0: Incomplete. Need additional imaging evaluation and/or prior mammograms for comparison.   Electronically Signed   By: Evangeline Dakin M.D.   On: 12/30/2019 10:30  Assessment & Plan:  Kelli Thompson is a 55 y.o. female with a right breast mass that is felt to discordant with imaging. The pathology came  back with possible hamartoma. We discussed that this is benign but that this is discordant with the imaging and the radiologist feels like it could still be a cancer. These lesions also can be rarely associated with breast cancers. Right now we do not have a diagnosis of cancer. We discussed the option of excisional breast biopsy after seed localization and risk of bleeding, infection, finding cancer, needing additional surgery to remove cancer or to sample lymph nodes.  Discussed preop COVID testing.   All questions were answered to the satisfaction of the patient.    Virl Cagey 02/07/2020, 11:38 AM

## 2020-02-07 NOTE — Patient Instructions (Signed)
Breast Biopsy A breast biopsy is a procedure in which a sample of breast tissue is removed from the breast and examined under a microscope to see if cancerous cells are present. You may need a breast biopsy if you have:  Any undiagnosed breast mass (tumor).  Nipple abnormalities, dimpling, crusting, or ulcerations.  Abnormal discharge from the nipple, especially blood.  Redness, swelling, and pain of the breast.  Calcium deposits (calcifications) or abnormalities seen on a mammogram, ultrasound results, or MRI results.  Abnormal changes in the breast seen on your mammogram. If the breast abnormality is found to be cancerous (malignant), a breast biopsy can help to determine what the best treatment is for you. There are many different types of breast biopsies. Talk with your health care provider about your options and which type is best for you. Tell a health care provider about:  Any allergies you have.  All medicines you are taking, including vitamins, herbs, eye drops, creams, and over-the-counter medicines.  Any problems you or family members have had with anesthetic medicines.  Any blood disorders you have.  Any surgeries you have had.  Any medical conditions you have.  Whether you are pregnant or may be pregnant. What are the risks? Generally, this is a safe procedure. However, problems may occur, including:  Discomfort. This is temporary.  Bruising and swelling of the breast.  Changes in the shape of the breast.  Bleeding.  Infection.  Damage to other tissues.  Allergic reactions to medicines.  Needing more surgery. What happens before the procedure? Medicines Ask your health care provider about:  Changing or stopping your regular medicines. This is especially important if you are taking diabetes medicines or blood thinners.  Taking medicines such as aspirin and ibuprofen. These medicines can thin your blood. Do not take these medicines unless your health  care provider tells you to take them.  Taking over-the-counter medicines, vitamins, herbs, and supplements. Lifestyle  Do not use any products that contain nicotine or tobacco, such as cigarettes, e-cigarettes, and chewing tobacco. If you need help quitting, ask your health care provider.  Do not drink alcohol for 24 hours before the procedure.  Wear a good support bra to the procedure. Eating and drinking restrictions Talk to your health care provider about when you should stop eating and drinking.  You may be asked not to drink or eat for 2-8 hours before the breast biopsy.  In some cases, you may be allowed to eat a light breakfast. General instructions  Plan to have someone take you home from the hospital or clinic.  Ask your health care provider how your surgical site will be marked or identified.  Ask your health care provider what steps will be taken to help prevent infection. These may include: ? Removing hair at the surgery site. ? Washing skin with a germ-killing soap.  Your health care provider may do a procedure to locate and mark the tumor area in your breast (localization). This will help guide your surgeon to where the biopsy or incision is made. This may be done with: ? Imaging, such as a mammogram, ultrasound, or MRI. ? Insertion of special wire, clip, seed, or radar reflector implant in the tumor area. What happens during the procedure?  You may be given one or more of the following: ? A medicine to numb the breast area (local anesthetic). ? A medicine to help you relax (sedative). ? A medicine to make you fall asleep (general anesthetic).  Your health  care provider will perform the biopsy using only one of the following methods. He or she will do: ? Fine needle aspiration. A thin needle with a syringe will be inserted into a breast cyst. Fluid and cells will be removed. ? Core needle biopsy. A wide, hollow needle (core needle) will be inserted into a breast  lump multiple times to remove tissue samples or cores. ? Stereotactic biopsy. X-rays and a computer will be used to locate the breast lump. The surgeon will use the X-ray images to collect several samples of tissue using a needle. ? Vacuum-assisted biopsy. A small incision will be made in your breast. A hollow needle and vacuum will be passed through the incision and into the breast tissue. The vacuum will gently draw abnormal breast tissue into the needle to remove it. ? Ultrasound-guided core needle biopsy. An ultrasound will be used to help guide the core needle to the area of the mass or abnormality. An incision will be made to insert the needle. Then tissue samples will be removed. ? Surgical biopsy. An incision will be made in the breast to remove part or all of the abnormal tissue. After the tissue is removed, the skin over the area will be closed with sutures and covered with a dressing. There are two types of surgical biopsies:  Incisional biopsy. The surgeon will remove part of the breast lump.  Excisional biopsy. The surgeon will attempt to remove the whole breast lump or as much of it as possible. After any of these procedures, the tissue or fluid that was removed will be examined under a microscope. The procedure may vary among health care providers and hospitals.   What happens after the procedure?  You will be taken to the recovery area. If you are doing well and have no problems, you will be allowed to go home.  You may have a pressure dressing applied on your breast for 24-48 hours. You may also be advised to wear a supportive bra during this time.  Do not drive for 24 hours if you were given a sedative during your procedure. Summary  A breast biopsy is a procedure in which a sample of breast tissue is removed from the breast and examined under a microscope to see if cancerous cells are present.  This is a safe procedure, but problems can occur, including bleeding, infection,  pain, and bruising.  Ask your health care provider about changing or stopping your regular medicines.  Plan to have someone take you home from the hospital or clinic. This information is not intended to replace advice given to you by your health care provider. Make sure you discuss any questions you have with your health care provider. Document Revised: 09/26/2019 Document Reviewed: 07/01/2017 Elsevier Patient Education  [place].

## 2020-02-07 NOTE — H&P (Signed)
[facility] History and Physical  Reason for Referral: Right breast mass  Referring Physician:  Kenvil   Chief Complaint    New Patient (Initial Visit)      Kelli Thompson is a 55 y.o. female.  HPI: Kelli Thompson is a 55 yo who underwent her screening mammogram in November and then diagnostic and [place] with biopsy. Pathology has come back with concern for fibroepithelial lesion possibly Hamartoma but this is thought to be discordant by the Radiologist.  The patient has no history of any masses, lumps, bumps, nipple changes or discharge. She had menarche at age 11, and her first pregnancy at age 69. She is She has no history of any family breast cancer. She underwent menopause at 59 with a total hysterectomy per her report.    Past Medical History:  Diagnosis Date  . Anxiety   . Hypertension   . Pancreatitis    around 2013; x1 secondary to lisinopril per patient  . Peri-menopause 02/28/2014  . Urinary frequency 01/30/2014  . Urinary incontinence, mixed 01/30/2014    Past Surgical History:  Procedure Laterality Date  . ABDOMINAL HYSTERECTOMY    . APPENDECTOMY    . CESAREAN SECTION     x2  . CHOLECYSTECTOMY    . COLONOSCOPY WITH PROPOFOL N/A 02/02/2020   Procedure: COLONOSCOPY WITH PROPOFOL;  Surgeon: Eloise Harman, DO;  Location: [facility];  Service: Endoscopy;  Laterality: N/A;  8:15am    Family History  Problem Relation Age of Onset  . Hypertension Mother   . Diabetes Mother        borderline  . Hypertension Father   . Asthma Father   . Stroke Father   . Diabetes Maternal Grandmother   . Congestive Heart Failure Maternal Grandmother   . Arthritis Maternal Grandmother   . Parkinson's disease Maternal Grandfather   . Cancer Paternal Grandmother   . Alcohol abuse Paternal Grandfather   . Other Son        stomach issues  . Colon cancer Neg Hx     Social History   Tobacco Use  . Smoking status: Former Smoker    Packs/day: 0.50    Years:  30.00    Pack years: 15.00    Types: Cigarettes    Quit date: 03/27/2013    Years since quitting: 6.8  . Smokeless tobacco: Never Used  Vaping Use  . Vaping Use: Never used  Substance Use Topics  . Alcohol use: No  . Drug use: No    Medications: I have reviewed the patient's current medications. Allergies as of 02/07/2020      Reactions   Ace Inhibitors    pancreatitis      Medication List       Accurate as of February 07, 2020 11:38 AM. If you have any questions, ask your nurse or doctor.        amLODipine 10 MG tablet Commonly known as: NORVASC TAKE ONE TABLET BY MOUTH DAILY FOR BLOOD PRESSURE What changed:   how much to take  how to take this  when to take this   carvedilol 12.5 MG tablet Commonly known as: COREG 1 po BID What changed:   how much to take  how to take this  when to take this   DULoxetine 60 MG capsule Commonly known as: CYMBALTA Take 1 capsule (60 mg total) by mouth daily with supper.   famotidine 20 MG tablet Commonly known as: PEPCID Take 1 tablet (20  mg total) by mouth 2 (two) times daily.   furosemide 40 MG tablet Commonly known as: LASIX Take 1 tablet (40 mg total) by mouth daily.   olmesartan 40 MG tablet Commonly known as: BENICAR Take 1 tablet (40 mg total) by mouth daily.   potassium chloride SA 20 MEQ tablet Commonly known as: Klor-Con M20 TAKE ONE TABLET BY MOUTH DAILY FOR POTASSIUM What changed:   how much to take  how to take this  when to take this   pregabalin 200 MG capsule Commonly known as: Lyrica Take 1 capsule (200 mg total) by mouth at bedtime.        ROS:  A comprehensive review of systems was negative except for: Musculoskeletal: positive for neck pain and joint pain  Blood pressure 139/89, pulse 88, temperature 97.8 F (36.6 C), temperature source Other (Comment), resp. rate 14, height 5' 5"  (1.651 m), weight 235 lb (106.6 kg), SpO2 97 %. Physical Exam Vitals reviewed.  Constitutional:       Appearance: Normal appearance.  HENT:     Head: Normocephalic.     Nose: Nose normal.     Mouth/Throat:     Mouth: Mucous membranes are moist.  Eyes:     Extraocular Movements: Extraocular movements intact.  Cardiovascular:     Rate and Rhythm: Normal rate and regular rhythm.  Pulmonary:     Effort: Pulmonary effort is normal.     Breath sounds: Normal breath sounds.  Chest:  Breasts:     Right: No mass, nipple discharge, tenderness, axillary adenopathy or supraclavicular adenopathy.     Left: No mass, nipple discharge, skin change, tenderness, axillary adenopathy or supraclavicular adenopathy.      Comments: Bruising on right lateral breast Abdominal:     General: There is no distension.     Palpations: Abdomen is soft.     Tenderness: There is no abdominal tenderness.  Musculoskeletal:        General: Normal range of motion.     Cervical back: Normal range of motion.  Lymphadenopathy:     Upper Body:     Right upper body: No supraclavicular or axillary adenopathy.     Left upper body: No supraclavicular or axillary adenopathy.  Skin:    General: Skin is warm.  Neurological:     General: No focal deficit present.     Mental Status: She is alert and oriented to person, place, and time.  Psychiatric:        Mood and Affect: Mood normal.        Behavior: Behavior normal.        Thought Content: Thought content normal.        Judgment: Judgment normal.     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 This was found to be discordant by Dr. Franki Cabot, with excision recommended.  Pathology results were discussed with the patient by telephone. The patient reported doing well after the biopsy with tenderness, bleeding and bruising at the site. Post biopsy instructions and  care were reviewed and questions were answered. The patient was encouraged to call [facility] for any additional concerns. My direct phone number was provided.  A surgical referral is being arranged with [facility] in [place], [place], per patient request.  Pathology results reported by Terie Purser, RN on 01/25/2020.  Electronically Signed   By: Franki Cabot M.D.   On: 01/25/2020 09:59  CLINICAL DATA:  Patient with an indeterminate 5 mm mass in the RIGHT breast at 10 o'clock axis presents today for ultrasound-guided core biopsy  EXAM: ULTRASOUND GUIDED RIGHT BREAST CORE NEEDLE BIOPSY  COMPARISON:  Previous exam(s).  PROCEDURE: I met with the patient and we discussed the procedure of ultrasound-guided biopsy, including benefits and alternatives. We discussed the high likelihood of a successful procedure. We discussed the risks of the procedure, including infection, bleeding, tissue injury, clip migration, and inadequate sampling. Informed written consent was given. The usual time-out protocol was performed immediately prior to the procedure.  Lesion quadrant: Upper outer quadrant  Using sterile technique and 1% Lidocaine as local anesthetic, under direct ultrasound visualization, a 12 gauge spring-loaded device was used to perform biopsy of the 5 mm mass within the RIGHT breast at the 10 o'clock axis using a inferolateral approach. At the conclusion of the procedure ribbon shaped tissue marker clip was deployed into the biopsy cavity. Follow up 2 view mammogram was performed and dictated separately.  IMPRESSION: Ultrasound guided biopsy of the 5 mm mass in the RIGHT breast at the 10 o'clock axis. No apparent complications.  Electronically Signed: By: Franki Cabot M.D. On: 01/18/2020 14:28  CLINICAL DATA:  Screening recall for a possible right breast mass.  EXAM: DIGITAL DIAGNOSTIC UNILATERAL RIGHT  MAMMOGRAM WITH TOMO AND CAD; ULTRASOUND RIGHT BREAST LIMITED  COMPARISON:  Previous exam(s).  ACR Breast Density Category b: There are scattered areas of fibroglandular density.  FINDINGS: Spot compression tomosynthesis images through the upper outer right breast demonstrates oval mass measuring approximately 4 mm. This is adjacent to a larger stable lymph node which measures 8 mm.  Mammographic images were processed with CAD.  Ultrasound targeted to the right breast at 10 o'clock, 8 cm from the nipple demonstrates an irregular hypoechoic mass measuring 5 x 4 x 3 mm. Ultrasound of the right axilla demonstrates multiple normal-appearing lymph nodes.  IMPRESSION: 1. There is an indeterminate 5 mm mass in the right breast at 10 o'clock.  2.  No evidence of right axillary lymphadenopathy.  RECOMMENDATION: Ultrasound guided biopsy is recommended for the right breast mass. The procedure has been scheduled for 01/18/2020 at 1:45 p.m.  I have discussed the findings and recommendations with the patient. If applicable, a reminder letter will be sent to the patient regarding the next appointment.  BI-RADS CATEGORY  4: Suspicious.   Electronically Signed   By: Ammie Ferrier M.D.   On: 01/12/2020 12:57  CLINICAL DATA:  Screening.  EXAM: DIGITAL SCREENING BILATERAL MAMMOGRAM WITH TOMO AND CAD  COMPARISON:  Previous exam(s).  ACR Breast Density Category b: There are scattered areas of fibroglandular density.  FINDINGS: In the right breast, a possible mass warrants further evaluation. In the left breast, no findings suspicious for malignancy. Images were processed with CAD.  IMPRESSION: Further evaluation is suggested for a possible mass in the right breast.  RECOMMENDATION: Diagnostic mammogram and possibly ultrasound of the right breast. (Code:[id number]-[id number])  The patient will be contacted regarding the findings, and additional imaging will be  scheduled.  BI-RADS CATEGORY  0: Incomplete. Need additional imaging evaluation and/or prior mammograms for comparison.   Electronically Signed   By: Evangeline Dakin M.D.   On: 12/30/2019 10:30  Assessment & Plan:  LARONDA LISBY is a 55 y.o. female with a right breast mass that is felt to discordant with imaging. The pathology came  back with possible hamartoma. We discussed that this is benign but that this is discordant with the imaging and the radiologist feels like it could still be a cancer. These lesions also can be rarely associated with breast cancers. Right now we do not have a diagnosis of cancer. We discussed the option of excisional breast biopsy after seed localization and risk of bleeding, infection, finding cancer, needing additional surgery to remove cancer or to sample lymph nodes.  Discussed preop COVID testing.   All questions were answered to the satisfaction of the patient.    Virl Cagey 02/07/2020, 11:38 AM

## 2020-02-14 ENCOUNTER — Other Ambulatory Visit: Payer: Self-pay

## 2020-02-14 ENCOUNTER — Ambulatory Visit (HOSPITAL_COMMUNITY)
Admission: RE | Admit: 2020-02-14 | Discharge: 2020-02-14 | Disposition: A | Discharge status: Home / Self Care | Payer: 59 | Source: Ambulatory Visit | Attending: General Surgery | Admitting: General Surgery

## 2020-02-14 DIAGNOSIS — R928 Other abnormal and inconclusive findings on diagnostic imaging of breast: Secondary | ICD-10-CM

## 2020-02-15 NOTE — Patient Instructions (Signed)
Your procedure is scheduled on: 02/20/2020  Report to [facility] at   8:00  AM.  Call this number if you have problems the morning of surgery: [phone]   Remember:   Do not Eat or Drink after midnight         No Smoking the morning of surgery  :  Take these medicines the morning of surgery with A SIP OF WATER: Amlodipine, Carvedilol, Cymbalta, and pepcid   Do not wear jewelry, make-up or nail polish.  Do not wear lotions, powders, or perfumes. You may wear deodorant.  Do not shave 48 hours prior to surgery. Men may shave face and neck.  Do not bring valuables to the hospital.  Contacts, dentures or bridgework may not be worn into surgery.  Leave suitcase in the car. After surgery it may be brought to your room.  For patients admitted to the hospital, checkout time is 11:00 AM the day of discharge.   Patients discharged the day of surgery will not be allowed to drive home.    Special Instructions: Shower using CHG night before surgery and shower the day of surgery use CHG.  Use special wash - you have one bottle of CHG for all showers.  You should use approximately 1/2 of the bottle for each shower.  How to Use Chlorhexidine for Bathing Chlorhexidine gluconate (CHG) is a germ-killing (antiseptic) solution that is used to clean the skin. It can get rid of the bacteria that normally live on the skin and can keep them away for about 24 hours. To clean your skin with CHG, you may be given:  A CHG solution to use in the shower or as part of a sponge bath.  A prepackaged cloth that contains CHG. Cleaning your skin with CHG may help lower the risk for infection:  While you are staying in the intensive care unit of the hospital.  If you have a vascular access, such as a central line, to provide short-term or long-term access to your veins.  If you have a catheter to drain urine from your bladder.  If you are on a ventilator. A ventilator is a machine that helps you breathe by  moving air in and out of your lungs.  After surgery. What are the risks? Risks of using CHG include:  A skin reaction.  Hearing loss, if CHG gets in your ears.  Eye injury, if CHG gets in your eyes and is not rinsed out.  The CHG product catching fire. Make sure that you avoid smoking and flames after applying CHG to your skin. Do not use CHG:  If you have a chlorhexidine allergy or have previously reacted to chlorhexidine.  On babies younger than 74 months of age. How to use CHG solution  Use CHG only as told by your health care provider, and follow the instructions on the label.  Use the full amount of CHG as directed. Usually, this is one bottle. During a shower Follow these steps when using CHG solution during a shower (unless your health care provider gives you different instructions): 1. Start the shower. 2. Use your normal soap and shampoo to wash your face and hair. 3. Turn off the shower or move out of the shower stream. 4. Pour the CHG onto a clean washcloth. Do not use any type of brush or rough-edged sponge. 5. Starting at your neck, lather your body down to your toes. Make sure you follow these instructions: ? If you will be having  surgery, pay special attention to the part of your body where you will be having surgery. Scrub this area for at least 1 minute. ? Do not use CHG on your head or face. If the solution gets into your ears or eyes, rinse them well with water. ? Avoid your genital area. ? Avoid any areas of skin that have broken skin, cuts, or scrapes. ? Scrub your back and under your arms. Make sure to wash skin folds. 6. Let the lather sit on your skin for 1-2 minutes or as long as told by your health care provider. 7. Thoroughly rinse your entire body in the shower. Make sure that all body creases and crevices are rinsed well. 8. Dry off with a clean towel. Do not put any substances on your body afterward-such as powder, lotion, or perfume-unless you are  told to do so by your health care provider. Only use lotions that are recommended by the manufacturer. 9. Put on clean clothes or pajamas. 10. If it is the night before your surgery, sleep in clean sheets.   During a sponge bath Follow these steps when using CHG solution during a sponge bath (unless your health care provider gives you different instructions): 1. Use your normal soap and shampoo to wash your face and hair. 2. Pour the CHG onto a clean washcloth. 3. Starting at your neck, lather your body down to your toes. Make sure you follow these instructions: ? If you will be having surgery, pay special attention to the part of your body where you will be having surgery. Scrub this area for at least 1 minute. ? Do not use CHG on your head or face. If the solution gets into your ears or eyes, rinse them well with water. ? Avoid your genital area. ? Avoid any areas of skin that have broken skin, cuts, or scrapes. ? Scrub your back and under your arms. Make sure to wash skin folds. 4. Let the lather sit on your skin for 1-2 minutes or as long as told by your health care provider. 5. Using a different clean, wet washcloth, thoroughly rinse your entire body. Make sure that all body creases and crevices are rinsed well. 6. Dry off with a clean towel. Do not put any substances on your body afterward-such as powder, lotion, or perfume-unless you are told to do so by your health care provider. Only use lotions that are recommended by the manufacturer. 7. Put on clean clothes or pajamas. 8. If it is the night before your surgery, sleep in clean sheets. How to use CHG prepackaged cloths  Only use CHG cloths as told by your health care provider, and follow the instructions on the label.  Use the CHG cloth on clean, dry skin.  Do not use the CHG cloth on your head or face unless your health care provider tells you to.  When washing with the CHG cloth: ? Avoid your genital area. ? Avoid any areas of  skin that have broken skin, cuts, or scrapes. Before surgery Follow these steps when using a CHG cloth to clean before surgery (unless your health care provider gives you different instructions): 1. Using the CHG cloth, vigorously scrub the part of your body where you will be having surgery. Scrub using a back-and-forth motion for 3 minutes. The area on your body should be completely wet with CHG when you are done scrubbing. 2. Do not rinse. Discard the cloth and let the area air-dry. Do not put any  substances on the area afterward, such as powder, lotion, or perfume. 3. Put on clean clothes or pajamas. 4. If it is the night before your surgery, sleep in clean sheets.   For general bathing Follow these steps when using CHG cloths for general bathing (unless your health care provider gives you different instructions). 1. Use a separate CHG cloth for each area of your body. Make sure you wash between any folds of skin and between your fingers and toes. Wash your body in the following order, switching to a new cloth after each step: ? The front of your neck, shoulders, and chest. ? Both of your arms, under your arms, and your hands. ? Your stomach and groin area, avoiding the genitals. ? Your right leg and foot. ? Your left leg and foot. ? The back of your neck, your back, and your buttocks. 2. Do not rinse. Discard the cloth and let the area air-dry. Do not put any substances on your body afterward-such as powder, lotion, or perfume-unless you are told to do so by your health care provider. Only use lotions that are recommended by the manufacturer. 3. Put on clean clothes or pajamas. Contact a health care provider if:  Your skin gets irritated after scrubbing.  You have questions about using your solution or cloth. Get help right away if:  Your eyes become very red or swollen.  Your eyes itch badly.  Your skin itches badly and is red or swollen.  Your hearing changes.  You have trouble  seeing.  You have swelling or tingling in your mouth or throat.  You have trouble breathing.  You swallow any chlorhexidine. Summary  Chlorhexidine gluconate (CHG) is a germ-killing (antiseptic) solution that is used to clean the skin. Cleaning your skin with CHG may help to lower your risk for infection.  You may be given CHG to use for bathing. It may be in a bottle or in a prepackaged cloth to use on your skin. Carefully follow your health care provider's instructions and the instructions on the product label.  Do not use CHG if you have a chlorhexidine allergy.  Contact your health care provider if your skin gets irritated after scrubbing. This information is not intended to replace advice given to you by your health care provider. Make sure you discuss any questions you have with your health care provider. Document Revised: 07/01/2019 Document Reviewed: 07/01/2019 Elsevier Patient Education  2021 [place].  Breast Biopsy, Care After These instructions give you information about caring for yourself after your procedure. Your doctor may also give you more specific instructions. Call your doctor if you have any problems or questions after your procedure. What can I expect after the procedure? After your procedure, it is common to have:  Bruising on your breast.  Numbness, tingling, or pain near your biopsy site. Follow these instructions at home: Medicines  Take over-the-counter and prescription medicines only as told by your doctor.  Do not drive for 24 hours if you were given a medicine to help you relax (sedative) during your procedure.  Do not drink alcohol while taking pain medicine.  Do not drive or use heavy machinery while taking prescription pain medicine. Biopsy site care  Follow instructions from your doctor about how to take care of your cut from surgery (incision) or your puncture area. Make sure you: ? Wash your hands with soap and water before you change  your bandage (dressing). If you cannot use soap and water, use hand  sanitizer. ? Change your bandage as told by your doctor. ? Leave stitches (sutures), skin glue, or skin tape (adhesive strips) in place. They may need to stay in place for 2 weeks or longer. If tape strips get loose and curl up, you may trim the loose edges. Do not remove tape strips completely unless your doctor says it is okay.  If you have stitches, keep them dry when you take a bath or a shower.  Check your cut or puncture area every day for signs of infection. Check for: ? Redness, swelling, or pain. ? Fluid or blood. ? Warmth. ? Pus or a bad smell.  Protect the biopsy area. Do not let the area get bumped.      Activity  If you had a cut during your procedure, avoid activities that could pull your cut open. These include: ? Stretching. ? Reaching over your head. ? Exercise. ? Sports. ? Lifting anything that weighs more than 3 lb (1.4 kg).  Return to your normal activities as told by your doctor. Ask your doctor what activities are safe for you. Managing pain, stiffness, and swelling If told, put ice on the biopsy site to relieve swelling:  Put ice in a plastic bag.  Place a towel between your skin and the bag.  Leave the ice on for 20 minutes, 2-3 times a day. General instructions  Continue your normal diet.  Wear a good support bra for as long as told by your doctor.  Get checked for extra fluid around your lymph nodes (lymphedema) as often as told by your doctor.  Keep all follow-up visits as told by your doctor. This is important. Contact a doctor if:  You notice any of the following at the biopsy site: ? More redness, swelling, or pain. ? More fluid or blood coming from the site. ? The site feels warm to the touch. ? Pus or a bad smell coming from the site. ? The site breaks open after the stitches or skin tape strips have been removed.  You have a rash.  You have a fever. Get help right  away if:  You have more bleeding from the biopsy site. Get help right away if bleeding is more than a small spot.  You have trouble breathing.  You have red streaks around the biopsy site. Summary  After your procedure, it is common to have bruising, numbness, tingling, or pain near the biopsy site.  Do not drive or use heavy machinery while taking prescription pain medicine.  Wear a good support bra for as long as told by your doctor.  If you had a cut during your procedure, avoid activities that may pull the cut open. Ask your doctor what activities are safe for you. This information is not intended to replace advice given to you by your health care provider. Make sure you discuss any questions you have with your health care provider. Document Revised: 09/26/2019 Document Reviewed: 07/02/2017 Elsevier Patient Education  2021 [place] Anesthesia, Adult, Care After This sheet gives you information about how to care for yourself after your procedure. Your health care provider may also give you more specific instructions. If you have problems or questions, contact your health care provider. What can I expect after the procedure? After the procedure, the following side effects are common:  Pain or discomfort at the IV site.  Nausea.  Vomiting.  Sore throat.  Trouble concentrating.  Feeling cold or chills.  Feeling weak or tired.  Sleepiness and fatigue.  Soreness and body aches. These side effects can affect parts of the body that were not involved in surgery. Follow these instructions at home: For the time period you were told by your health care provider:  Rest.  Do not participate in activities where you could fall or become injured.  Do not drive or use machinery.  Do not drink alcohol.  Do not take sleeping pills or medicines that cause drowsiness.  Do not make important decisions or sign legal documents.  Do not take care of children on your  own.   Eating and drinking  Follow any instructions from your health care provider about eating or drinking restrictions.  When you feel hungry, start by eating small amounts of foods that are soft and easy to digest (bland), such as toast. Gradually return to your regular diet.  Drink enough fluid to keep your urine pale yellow.  If you vomit, rehydrate by drinking water, juice, or clear broth. General instructions  If you have sleep apnea, surgery and certain medicines can increase your risk for breathing problems. Follow instructions from your health care provider about wearing your sleep device: ? Anytime you are sleeping, including during daytime naps. ? While taking prescription pain medicines, sleeping medicines, or medicines that make you drowsy.  Have a responsible adult stay with you for the time you are told. It is important to have someone help care for you until you are awake and alert.  Return to your normal activities as told by your health care provider. Ask your health care provider what activities are safe for you.  Take over-the-counter and prescription medicines only as told by your health care provider.  If you smoke, do not smoke without supervision.  Keep all follow-up visits as told by your health care provider. This is important. Contact a health care provider if:  You have nausea or vomiting that does not get better with medicine.  You cannot eat or drink without vomiting.  You have pain that does not get better with medicine.  You are unable to pass urine.  You develop a skin rash.  You have a fever.  You have redness around your IV site that gets worse. Get help right away if:  You have difficulty breathing.  You have chest pain.  You have blood in your urine or stool, or you vomit blood. Summary  After the procedure, it is common to have a sore throat or nausea. It is also common to feel tired.  Have a responsible adult stay with you for  the time you are told. It is important to have someone help care for you until you are awake and alert.  When you feel hungry, start by eating small amounts of foods that are soft and easy to digest (bland), such as toast. Gradually return to your regular diet.  Drink enough fluid to keep your urine pale yellow.  Return to your normal activities as told by your health care provider. Ask your health care provider what activities are safe for you. This information is not intended to replace advice given to you by your health care provider. Make sure you discuss any questions you have with your health care provider. Document Revised: 09/29/2019 Document Reviewed: 04/28/2019 Elsevier Patient Education  2021 [employer].

## 2020-02-16 ENCOUNTER — Encounter (HOSPITAL_COMMUNITY): Payer: Self-pay

## 2020-02-16 ENCOUNTER — Encounter (HOSPITAL_COMMUNITY)
Admission: RE | Admit: 2020-02-16 | Discharge: 2020-02-16 | Disposition: A | Discharge status: Home / Self Care | Payer: 59 | Source: Ambulatory Visit | Attending: General Surgery | Admitting: General Surgery

## 2020-02-16 ENCOUNTER — Other Ambulatory Visit: Payer: Self-pay

## 2020-02-16 ENCOUNTER — Other Ambulatory Visit (HOSPITAL_COMMUNITY)
Admission: RE | Admit: 2020-02-16 | Discharge: 2020-02-16 | Disposition: A | Discharge status: Home / Self Care | Payer: 59 | Source: Ambulatory Visit | Attending: General Surgery | Admitting: General Surgery

## 2020-02-16 DIAGNOSIS — Z20822 Contact with and (suspected) exposure to covid-19: Secondary | ICD-10-CM | POA: Insufficient documentation

## 2020-02-16 DIAGNOSIS — Z01812 Encounter for preprocedural laboratory examination: Secondary | ICD-10-CM | POA: Insufficient documentation

## 2020-02-16 HISTORY — DX: Gastro-esophageal reflux disease without esophagitis: K21.9

## 2020-02-16 HISTORY — DX: Depression, unspecified: F32.A

## 2020-02-16 HISTORY — DX: Unspecified osteoarthritis, unspecified site: M19.90

## 2020-02-16 LAB — BASIC METABOLIC PANEL
Anion gap: 9 (ref 5–15)
BUN: 16 mg/dL (ref 6–20)
CO2: 25 mmol/L (ref 22–32)
Calcium: 8.9 mg/dL (ref 8.9–10.3)
Chloride: 104 mmol/L (ref 98–111)
Creatinine, Ser: 0.81 mg/dL (ref 0.44–1.00)
GFR, Estimated: >60 mL/min (ref >60)
Glucose, Bld: 157 mg/dL — ABNORMAL HIGH (ref 70–99)
Potassium: 3.6 mmol/L (ref 3.5–5.1)
Sodium: 138 mmol/L (ref 135–145)

## 2020-02-16 LAB — SARS CORONAVIRUS 2 (TAT 6-24 HRS): SARS Coronavirus 2: NEGATIVE

## 2020-02-17 ENCOUNTER — Encounter (HOSPITAL_COMMUNITY)
Admission: RE | Admit: 2020-02-17 | Discharge: 2020-02-17 | Disposition: A | Discharge status: Home / Self Care | Payer: 59 | Source: Ambulatory Visit | Attending: General Surgery | Admitting: General Surgery

## 2020-02-17 ENCOUNTER — Other Ambulatory Visit (HOSPITAL_COMMUNITY): Payer: 59

## 2020-02-20 ENCOUNTER — Ambulatory Visit (HOSPITAL_COMMUNITY)
Admission: RE | Admit: 2020-02-20 | Discharge: 2020-02-20 | Disposition: A | Discharge status: Home / Self Care | Payer: 59 | Attending: General Surgery | Admitting: General Surgery

## 2020-02-20 ENCOUNTER — Encounter (HOSPITAL_COMMUNITY): Payer: Self-pay | Admitting: General Surgery

## 2020-02-20 ENCOUNTER — Encounter (HOSPITAL_COMMUNITY)
Admission: RE | Disposition: A | Discharge status: Home / Self Care | Payer: Self-pay | Source: Home / Self Care | Attending: General Surgery

## 2020-02-20 ENCOUNTER — Ambulatory Visit (HOSPITAL_COMMUNITY): Payer: 59 | Admitting: Certified Registered"

## 2020-02-20 ENCOUNTER — Ambulatory Visit (HOSPITAL_COMMUNITY): Payer: 59

## 2020-02-20 DIAGNOSIS — Z811 Family history of alcohol abuse and dependence: Secondary | ICD-10-CM | POA: Diagnosis not present

## 2020-02-20 DIAGNOSIS — Z888 Allergy status to other drugs, medicaments and biological substances status: Secondary | ICD-10-CM | POA: Insufficient documentation

## 2020-02-20 DIAGNOSIS — Z8261 Family history of arthritis: Secondary | ICD-10-CM | POA: Diagnosis not present

## 2020-02-20 DIAGNOSIS — Z809 Family history of malignant neoplasm, unspecified: Secondary | ICD-10-CM | POA: Insufficient documentation

## 2020-02-20 DIAGNOSIS — Z825 Family history of asthma and other chronic lower respiratory diseases: Secondary | ICD-10-CM | POA: Insufficient documentation

## 2020-02-20 DIAGNOSIS — Z87891 Personal history of nicotine dependence: Secondary | ICD-10-CM | POA: Insufficient documentation

## 2020-02-20 DIAGNOSIS — Z833 Family history of diabetes mellitus: Secondary | ICD-10-CM | POA: Diagnosis not present

## 2020-02-20 DIAGNOSIS — N6311 Unspecified lump in the right breast, upper outer quadrant: Secondary | ICD-10-CM | POA: Diagnosis present

## 2020-02-20 DIAGNOSIS — N6310 Unspecified lump in the right breast, unspecified quadrant: Secondary | ICD-10-CM | POA: Diagnosis not present

## 2020-02-20 DIAGNOSIS — Z8249 Family history of ischemic heart disease and other diseases of the circulatory system: Secondary | ICD-10-CM | POA: Diagnosis not present

## 2020-02-20 DIAGNOSIS — Z79899 Other long term (current) drug therapy: Secondary | ICD-10-CM | POA: Insufficient documentation

## 2020-02-20 DIAGNOSIS — D241 Benign neoplasm of right breast: Secondary | ICD-10-CM | POA: Diagnosis not present

## 2020-02-20 DIAGNOSIS — R928 Other abnormal and inconclusive findings on diagnostic imaging of breast: Secondary | ICD-10-CM

## 2020-02-20 DIAGNOSIS — Z823 Family history of stroke: Secondary | ICD-10-CM | POA: Diagnosis not present

## 2020-02-20 HISTORY — PX: BREAST BIOPSY: SHX20

## 2020-02-20 SURGERY — BREAST BIOPSY WITH NEEDLE LOCALIZATION
Anesthesia: General | Site: Breast | Laterality: Right

## 2020-02-20 MED ORDER — MIDAZOLAM HCL 5 MG/5ML IJ SOLN
INTRAMUSCULAR | Status: DC | PRN
  Administered 2020-02-20: 2 mg via INTRAVENOUS

## 2020-02-20 MED ORDER — GLYCOPYRROLATE 0.2 MG/ML IJ SOLN
INTRAMUSCULAR | Status: DC | PRN
  Administered 2020-02-20: .2 mg via INTRAVENOUS

## 2020-02-20 MED ORDER — BUPIVACAINE HCL (PF) 0.5 % IJ SOLN
INTRAMUSCULAR | Status: AC
  Filled 2020-02-20: qty 30

## 2020-02-20 MED ORDER — PROPOFOL 10 MG/ML IV BOLUS
INTRAVENOUS | Status: DC | PRN
  Administered 2020-02-20: 150 mg via INTRAVENOUS

## 2020-02-20 MED ORDER — FENTANYL CITRATE (PF) 100 MCG/2ML IJ SOLN
INTRAMUSCULAR | Status: AC
  Filled 2020-02-20: qty 2

## 2020-02-20 MED ORDER — MIDAZOLAM HCL 2 MG/2ML IJ SOLN
INTRAMUSCULAR | Status: AC
  Filled 2020-02-20: qty 2

## 2020-02-20 MED ORDER — OXYCODONE HCL 5 MG PO TABS: 5.0000 mg | ORAL_TABLET | ORAL | 0 refills | Status: DC | PRN

## 2020-02-20 MED ORDER — ONDANSETRON HCL 4 MG PO TABS: 4.0000 mg | ORAL_TABLET | Freq: Three times a day (TID) | ORAL | 0 refills | Status: DC | PRN

## 2020-02-20 MED ORDER — FENTANYL CITRATE (PF) 100 MCG/2ML IJ SOLN: 25.0000 ug | INTRAMUSCULAR | Status: DC | PRN

## 2020-02-20 MED ORDER — CHLORHEXIDINE GLUCONATE 0.12 % MT SOLN
15.0000 mL | Freq: Once | OROMUCOSAL | Status: AC
  Administered 2020-02-20: 15 mL via OROMUCOSAL

## 2020-02-20 MED ORDER — CHLORHEXIDINE GLUCONATE CLOTH 2 % EX PADS: 6.0000 | MEDICATED_PAD | Freq: Once | CUTANEOUS | Status: DC

## 2020-02-20 MED ORDER — CEFAZOLIN SODIUM-DEXTROSE 2-4 GM/100ML-% IV SOLN
INTRAVENOUS | Status: AC
  Filled 2020-02-20: qty 100

## 2020-02-20 MED ORDER — CEFAZOLIN SODIUM-DEXTROSE 2-4 GM/100ML-% IV SOLN: 2.0000 g | INTRAVENOUS | Status: DC

## 2020-02-20 MED ORDER — ALBUMIN HUMAN 25 % IV SOLN
12.5000 g | Freq: Once | INTRAVENOUS | Status: DC
  Filled 2020-02-20: qty 50

## 2020-02-20 MED ORDER — FENTANYL CITRATE (PF) 100 MCG/2ML IJ SOLN
INTRAMUSCULAR | Status: DC | PRN
  Administered 2020-02-20: 50 ug via INTRAVENOUS

## 2020-02-20 MED ORDER — CEFAZOLIN SODIUM-DEXTROSE 2-3 GM-%(50ML) IV SOLR
INTRAVENOUS | Status: DC | PRN
  Administered 2020-02-20: 2 g via INTRAVENOUS

## 2020-02-20 MED ORDER — PHENYLEPHRINE 40 MCG/ML (10ML) SYRINGE FOR IV PUSH (FOR BLOOD PRESSURE SUPPORT)
PREFILLED_SYRINGE | INTRAVENOUS | Status: DC | PRN
  Administered 2020-02-20: 100 ug via INTRAVENOUS
  Administered 2020-02-20: 80 ug via INTRAVENOUS
  Administered 2020-02-20: 100 ug via INTRAVENOUS
  Administered 2020-02-20 (×2): 80 ug via INTRAVENOUS
  Administered 2020-02-20 (×7): 100 ug via INTRAVENOUS
  Administered 2020-02-20 (×2): 80 ug via INTRAVENOUS
  Administered 2020-02-20 (×5): 100 ug via INTRAVENOUS

## 2020-02-20 MED ORDER — EPHEDRINE SULFATE-NACL 50-0.9 MG/10ML-% IV SOSY
PREFILLED_SYRINGE | INTRAVENOUS | Status: DC | PRN
  Administered 2020-02-20 (×2): 10 mg via INTRAVENOUS
  Administered 2020-02-20: 5 mg via INTRAVENOUS
  Administered 2020-02-20: 15 mg via INTRAVENOUS
  Administered 2020-02-20: 10 mg via INTRAVENOUS

## 2020-02-20 MED ORDER — PHENYLEPHRINE HCL-NACL 10-0.9 MG/250ML-% IV SOLN
INTRAVENOUS | Status: DC | PRN
  Administered 2020-02-20: 60 ug/min via INTRAVENOUS

## 2020-02-20 MED ORDER — ORAL CARE MOUTH RINSE: 15.0000 mL | Freq: Once | OROMUCOSAL | Status: AC

## 2020-02-20 MED ORDER — ONDANSETRON HCL 4 MG/2ML IJ SOLN
INTRAMUSCULAR | Status: DC | PRN
  Administered 2020-02-20: 4 mg via INTRAVENOUS

## 2020-02-20 MED ORDER — LACTATED RINGERS IV SOLN: INTRAVENOUS | Status: DC

## 2020-02-20 MED ORDER — ATROPINE SULFATE 0.4 MG/ML IJ SOLN
INTRAMUSCULAR | Status: DC | PRN
  Administered 2020-02-20: .2 mg via INTRAVENOUS

## 2020-02-20 MED ORDER — LIDOCAINE 2% (20 MG/ML) 5 ML SYRINGE
INTRAMUSCULAR | Status: DC | PRN
  Administered 2020-02-20: 80 mg via INTRAVENOUS

## 2020-02-20 MED ORDER — BUPIVACAINE HCL (PF) 0.5 % IJ SOLN
INTRAMUSCULAR | Status: DC | PRN
  Administered 2020-02-20: 10 mL

## 2020-02-20 MED ORDER — ONDANSETRON HCL 4 MG/2ML IJ SOLN: 4.0000 mg | Freq: Once | INTRAMUSCULAR | Status: DC | PRN

## 2020-02-20 MED ORDER — PROPOFOL 10 MG/ML IV BOLUS
INTRAVENOUS | Status: AC
  Filled 2020-02-20: qty 20

## 2020-02-20 MED ORDER — DEXAMETHASONE SODIUM PHOSPHATE 10 MG/ML IJ SOLN
INTRAMUSCULAR | Status: DC | PRN
  Administered 2020-02-20: 5 mg via INTRAVENOUS

## 2020-02-20 MED ORDER — ONDANSETRON HCL 4 MG/2ML IJ SOLN
INTRAMUSCULAR | Status: AC
  Filled 2020-02-20: qty 2

## 2020-02-20 MED ORDER — 0.9 % SODIUM CHLORIDE (POUR BTL) OPTIME
TOPICAL | Status: DC | PRN
  Administered 2020-02-20: 1000 mL

## 2020-02-20 SURGICAL SUPPLY — 39 items
ADH SKN CLS APL DERMABOND .7 (GAUZE/BANDAGES/DRESSINGS) ×1
APL PRP STRL LF DISP 70% ISPRP (MISCELLANEOUS) ×1
APPLIER CLIP 11 MED OPEN (CLIP) ×2
APR CLP MED 11 20 MLT OPN (CLIP) ×1
BLADE SURG 15 STRL LF DISP TIS (BLADE) ×1 IMPLANT
BLADE SURG 15 STRL SS (BLADE) ×2
CHLORAPREP W/TINT 26 (MISCELLANEOUS) ×2 IMPLANT
CLIP APPLIE 11 MED OPEN (CLIP) IMPLANT
CLOTH BEACON ORANGE TIMEOUT ST (SAFETY) ×2 IMPLANT
COVER LIGHT HANDLE STERIS (MISCELLANEOUS) ×4 IMPLANT
COVER WAND RF STERILE (DRAPES) ×2 IMPLANT
DECANTER SPIKE VIAL GLASS SM (MISCELLANEOUS) ×2 IMPLANT
DERMABOND ADVANCED (GAUZE/BANDAGES/DRESSINGS) ×1
DERMABOND ADVANCED .7 DNX12 (GAUZE/BANDAGES/DRESSINGS) ×1 IMPLANT
ELECT REM PT RETURN 9FT ADLT (ELECTROSURGICAL) ×2
ELECTRODE REM PT RTRN 9FT ADLT (ELECTROSURGICAL) ×1 IMPLANT
GAUZE SPONGE 4X4 16PLY XRAY LF (GAUZE/BANDAGES/DRESSINGS) ×6 IMPLANT
GLOVE BIO SURGEON STRL SZ 6.5 (GLOVE) ×4 IMPLANT
GLOVE BIOGEL PI IND STRL 6.5 (GLOVE) ×1 IMPLANT
GLOVE BIOGEL PI IND STRL 7.0 (GLOVE) ×2 IMPLANT
GLOVE BIOGEL PI INDICATOR 6.5 (GLOVE) ×1
GLOVE BIOGEL PI INDICATOR 7.0 (GLOVE) ×2
GOWN STRL REUS W/TWL LRG LVL3 (GOWN DISPOSABLE) ×6 IMPLANT
KIT MARKER MARGIN INK (KITS) ×2 IMPLANT
KIT TURNOVER KIT A (KITS) ×2 IMPLANT
MANIFOLD NEPTUNE II (INSTRUMENTS) ×2 IMPLANT
NDL HYPO 25X1 1.5 SAFETY (NEEDLE) ×1 IMPLANT
NEEDLE HYPO 25X1 1.5 SAFETY (NEEDLE) ×2 IMPLANT
NS IRRIG 1000ML POUR BTL (IV SOLUTION) ×2 IMPLANT
PACK MINOR (CUSTOM PROCEDURE TRAY) ×2 IMPLANT
PAD ARMBOARD 7.5X6 YLW CONV (MISCELLANEOUS) ×2 IMPLANT
SET BASIN LINEN APH (SET/KITS/TRAYS/PACK) ×2 IMPLANT
SET LOCALIZER 20 PROBE US (MISCELLANEOUS) ×2 IMPLANT
SUT MNCRL AB 4-0 PS2 18 (SUTURE) ×2 IMPLANT
SUT SILK 2 0 FSL 18 (SUTURE) ×1 IMPLANT
SUT SILK 2 0 SH (SUTURE) ×1 IMPLANT
SUT VIC AB 3-0 SH 27 (SUTURE) ×2
SUT VIC AB 3-0 SH 27X BRD (SUTURE) ×1 IMPLANT
SYR CONTROL 10ML LL (SYRINGE) ×2 IMPLANT

## 2020-02-20 NOTE — Progress Notes (Signed)
[facility]  Husband notified surgery completed. Will call with pathology results and do a post op phone call in a few weeks.  Rx sent to [facility].  Curlene Labrum, MD [facility] [address] [place], [place] [address] [phone] (office)

## 2020-02-20 NOTE — Anesthesia Preprocedure Evaluation (Signed)
Anesthesia Evaluation  Patient identified by MRN, date of birth, ID band Patient awake    Reviewed: Allergy & Precautions, H&P , NPO status , Patient's Chart, lab work & pertinent test results, reviewed documented beta blocker date and time   Airway Mallampati: II  TM Distance: >3 FB Neck ROM: full    Dental no notable dental hx. (+) Edentulous Lower, Edentulous Upper, Upper Dentures, Lower Dentures   Pulmonary neg pulmonary ROS, former smoker,    Pulmonary exam normal breath sounds clear to auscultation       Cardiovascular Exercise Tolerance: Good hypertension, negative cardio ROS   Rhythm:regular Rate:Normal     Neuro/Psych Anxiety negative neurological ROS  negative psych ROS   GI/Hepatic negative GI ROS, Neg liver ROS,   Endo/Other  negative endocrine ROS  Renal/GU negative Renal ROS  negative genitourinary   Musculoskeletal   Abdominal   Peds  Hematology negative hematology ROS (+)   Anesthesia Other Findings   Reproductive/Obstetrics negative OB ROS                             Anesthesia Physical  Anesthesia Plan  ASA: III  Anesthesia Plan: General   Post-op Pain Management:    Induction:   PONV Risk Score and Plan: Propofol infusion  Airway Management Planned:   Additional Equipment:   Intra-op Plan:   Post-operative Plan:   Informed Consent: I have reviewed the patients History and Physical, chart, labs and discussed the procedure including the risks, benefits and alternatives for the proposed anesthesia with the patient or authorized representative who has indicated his/her understanding and acceptance.     Dental Advisory Given  Plan Discussed with: CRNA  Anesthesia Plan Comments:         Anesthesia Quick Evaluation

## 2020-02-20 NOTE — Op Note (Addendum)
[facility] Operative Note  02/20/20  Preoperative Diagnosis:  Right breast mass   Postoperative Diagnosis: Same   Procedure(s) Performed:  Excisional breast biopsy right after radiofrequency tag localization    Surgeon: Lanell Matar. Constance Haw, MD   Assistants: No qualified resident was available    Anesthesia: General endotracheal   Anesthesiologist: Dr. Briant Cedar     Specimens:  Right breast biopsy (marked with specimen paint)    Estimated Blood Loss: 150cc   Blood Replacement: None    Complications: None   Wound Class: Clean    Operative Indications: Ms. 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 We discussed excisional biopsy versus treating this like a cancer and doing a sentinel node with her initial surgery.  We discussed risk of bleeding, infection, lymph edema, finding cancer, needing more surgery, and at this time she opted to have the excisional biopsy and undergo further surgery if needed.   Findings: Seed posteromedial to the biopsy clip; down to chest wall posteriorly    Procedure: The patient was taken to the operating room and placed supine. General endotracheal anesthesia was induced. Intravenous antibiotics were administered per protocol. The Hologic localizer was used to identify the location of the specimen in the shortest distance in the upper outer right breast and this was marked. The right breast was then prepared and draped in the usual sterile fashion.   A curvilinear incision was made on the outer upper breast and flaps were created. Using sharp dissection and the radiofrequency probe for the Hologic localizer, the breast tissue around the biopsy clip and the seed which was posteromedial to the clip was excised in its entirety and the posterior margin was down to the pectoralis muscle.  After excision, the  probe was used to ensure that the seed was within the specimen with sufficient margin.  The cavity was checked and no radiofrequency was detected.  The specimen was painted in the standard fashion and passed off the field.  Imaging was performed and verified the seed and clip were in the specimen.    The cavity was irrigated and hemostasis was achieved with clips and cautery. There was generalized oozing of blood during the case.  The cavity was left open to allow for seroma formation and cosmesis. The dermis was closed with interrupted 3-0 Vicryl and the skin was closed with a running 4-0 Monocryl Subcuticular.  The skin was closed with dermabond.   Final inspection revealed acceptable hemostasis. All counts were correct at the end of the case. The patient was awakened from anesthesia and extubated without complication.  The patient went to the PACU in stable condition.   Curlene Labrum, MD [facility] [address] [place],  [address] [phone] (office)

## 2020-02-20 NOTE — Transfer of Care (Signed)
Immediate Anesthesia Transfer of Care Note  Patient: Kelli Thompson  Procedure(s) Performed: BREAST EXCISIONAL BIOPSY AFTER SEED LOCATION (Right Breast)  Patient Location: PACU  Anesthesia Type:General  Level of Consciousness: drowsy and patient cooperative  Airway & Oxygen Therapy: Patient Spontanous Breathing and Patient connected to nasal cannula oxygen  Post-op Assessment: Report given to RN and Post -op Vital signs reviewed and stable  Post vital signs: Reviewed and stable  Last Vitals:  Vitals Value Taken Time  BP    Temp    Pulse 93 02/20/20 1103  Resp 11 02/20/20 1103  SpO2 95 % 02/20/20 1103  Vitals shown include unvalidated device data.  Last Pain:  Vitals:   02/20/20 0821  TempSrc: Oral  PainSc: 3       Patients Stated Pain Goal: 5 (11/16/09 7356)  Complications: No complications documented.

## 2020-02-20 NOTE — Anesthesia Procedure Notes (Signed)
Procedure Name: LMA Insertion Date/Time: 02/20/2020 9:18 AM Performed by: Gwyndolyn Saxon, CRNA Pre-anesthesia Checklist: Patient identified, Emergency Drugs available, Suction available and Patient being monitored Patient Re-evaluated:Patient Re-evaluated prior to induction Oxygen Delivery Method: Circle system utilized Preoxygenation: Pre-oxygenation with 100% oxygen Induction Type: IV induction Ventilation: Mask ventilation without difficulty LMA: LMA inserted LMA Size: 4.0 Number of attempts: 1 Airway Equipment and Method: Patient positioned with wedge pillow Placement Confirmation: positive ETCO2 and breath sounds checked- equal and bilateral Tube secured with: Tape Dental Injury: Teeth and Oropharynx as per pre-operative assessment

## 2020-02-20 NOTE — Anesthesia Postprocedure Evaluation (Signed)
Anesthesia Post Note  Patient: Kelli Thompson  Procedure(s) Performed: BREAST EXCISIONAL BIOPSY AFTER SEED LOCATION (Right Breast)  Patient location during evaluation: PACU Anesthesia Type: General Level of consciousness: awake Pain management: pain level controlled Vital Signs Assessment: post-procedure vital signs reviewed and stable Respiratory status: spontaneous breathing Cardiovascular status: blood pressure returned to baseline Postop Assessment: no headache Anesthetic complications: no   No complications documented.   Last Vitals:  Vitals:   02/20/20 1145 02/20/20 1156  BP: 106/80 105/68  Pulse: 86 83  Resp: 12 16  Temp:  36.7 C  SpO2: 97% 98%    Last Pain:  Vitals:   02/20/20 1156  TempSrc: Oral  PainSc: [place]

## 2020-02-20 NOTE — Discharge Instructions (Signed)
Discharge instructions after breast surgery:   Common Complaints: Pain and bruising at the incision sites.  Swelling at the incision sites. Stiffness of the arm.  I will call you with the pathology results when they come in.   Diet/ Activity: Diet as tolerated.  You may shower but do not take hot showers as this can disrupt the glue. Walk everyday for at least 15-20 minutes. Deep cough and move around every 1-2 hours in the first few days after surgery.  Do not lift > 10 lbs for the first 2 weeks after surgery. Do not do anything that makes you feel like you are putting unnecessary pull or stretch on the incision sites.  Do move your arm and shoulder (see exercises options below). If you do not move then you can get stiff and hurt more.  Do not pick at the dermabond glue on your incision sites.  This glue film will remain in place for 1-2 weeks and will start to peel off.  Do not place lotions or balms on your incision unless instructed to specifically by Dr. Constance Haw.   Pain Expectations and Narcotics: -After surgery you will have pain associated with your incisions and this is normal. The pain is muscular and nerve pain, and will get better with time. -You are encouraged and expected to take non narcotic medications like tylenol and ibuprofen (when able) to treat pain as multiple modalities can aid with pain treatment. -Narcotics are only used when pain is severe or there is breakthrough pain. -You are not expected to have a pain score of 0 after surgery, as we cannot prevent pain. A pain score of 3-4 that allows you to be functional, move, walk, and tolerate some activity is the goal. The pain will continue to improve over the days after surgery and is dependent on your surgery. -Due to [place] law, we are only able to give a certain amount of pain medication to treat post operative pain, and we only give additional narcotics on a patient by patient basis.  -For most laparoscopic surgery, studies  have shown that the majority of patients only need 10-15 narcotic pills, and for open surgeries most patients only need 15-20.   -Having appropriate expectations of pain and knowledge of pain management with non narcotics is important as we do not want anyone to become addicted to narcotic pain medication.  -Using ice packs in the first 48 hours and heating pads after 48 hours, wearing an abdominal binder (when recommended), and using over the counter medications are all ways to help with pain management.   -Simple acts like meditation and mindfulness practices after surgery can also help with pain control and research has proven the benefit of these practices.  Medication: Take tylenol and ibuprofen as needed for pain control, alternating every 4-6 hours.  Example:  Tylenol 1000mg  @ 6am, 12noon, 6pm, 26midnight (Do not exceed 4000mg  of tylenol a day). Ibuprofen 800mg  @ 9am, 3pm, 9pm, 3am (Do not exceed 3600mg  of ibuprofen a day).  Take Roxicodone for breakthrough pain every 4 hours.  Take Colace for constipation related to narcotic pain medication. If you do not have a bowel movement in 2 days, take Miralax over the counter.  Drink plenty of water to also prevent constipation.   Contact Information: If you have questions or concerns, please call our office, [phone], Monday- Thursday 8AM-5PM and Friday 8AM-12Noon.  If it is after hours or on the weekend, please call [facility], [phone], [phone], and ask  to speak to the surgeon on call for Dr. Henreitta Leber at [facility]   Exercises After Breast Surgery Do at least a few of the exercises below twice a day. It is ok to start the day after surgery and gradually build up the amount and type of exercises you do. Link to the exercises with pictures ([email or web address].au).   Deep Breathing Exercise Deep breathing can help you relax and ease discomfort and tightness around  your incision (surgical cut). Its also a very good way to relieve stress during the day.  Sit comfortably in a chair. Take a slow, deep breath through your nose. Let your chest and belly expand. Breathe out slowly through your mouth. Repeat as many times as needed.  Arm and Shoulder Exercises Doing arm and shoulder exercises will help you get back your full range of motion on your affected side (the side where you had your surgery). With full range of motion, youll be able to: Move your arm over your head and out to the side Move your arm behind your neck Move your arm to the middle of your back Do each of the exercises below 5 times a day. Keep doing this until you have a full range of motion again and can use your arm as you did before surgery in all your normal activities. This includes activities at work, at home, and in recreation or sports. If you had limited movement in your arm before surgery, your goal will be to get back as much movement as you had before.  If you get your full range of motion back quickly, keep doing these exercises once a day instead of 5 times a day. This is especially true if you feel any tightness in your chest, shoulder, or under your affected arm. These exercises can help keep scar tissue from forming in your armpit and shoulder. Scar tissue can limit your arm movements later.  If you still have trouble moving your shoulder 4 weeks after your surgery, tell your surgeon. Theyll tell you if you need more rehabilitation, such as physical or occupational therapy.  If you had one of the following surgeries, you can do the following set of exercises on the first day after your surgery, as long as your surgeon tells you its safe.  Shoulder rolls The shoulder roll is a good exercise to start with because it gently stretches your chest and shoulder muscles.  Stand or sit comfortably with your arms relaxed at your sides. Start with backward shoulder rolls. In a  circular motion, bring your shoulders forward, up, backward, and down. Do this 10 times. Switch directions and do 10 forward shoulder rolls. Bring your shoulders backward, up, forward, and down. Do this 10 times. Try to make the circles as big as you can and move both shoulders at the same time. If you have some tightness across your incision or chest, start with smaller circles and make them bigger as the tightness decreases. The backward direction might feel a little tighter across your chest than the forward direction. This will get better with practice.  Shoulder wings The shoulder wings exercise will help you get back outward movement of your shoulder. You can do this exercise while sitting or standing.  Place your hands on your chest or collarbone. Raise your elbows out to the side, limiting your range of motion as instructed by your healthcare team. Slowly lower your elbows. Do this 10 times. Then, slowly lower your hands. If you feel discomfort while  doing this exercise, hold your position and do the deep breathing exercise. If the discomfort passes, raise your elbows a little higher. If it doesnt pass, dont raise your elbows any higher. Finish the exercise raising your elbows only high enough to feel a gentle stretch and no discomfort.  Arm circles If you had surgery on both breasts, do this exercise with both arms, 1 arm at a time. Dont do this exercise with both arms at the same time. This will put too much pressure on your chest.  Stand with your feet slightly apart for balance. Raise your affected arm out to the side as high as you can, limiting your range of movement as instructed by your healthcare team. Start making slow, backward circles in the air with your arm. Make sure youre moving your arm from your shoulder, not your elbow. Keep your elbow straight. Increase the size of the circles until theyre as big as you can comfortably make them, limiting your range of motion as  instructed by your healthcare team. If you feel any aching or if your arm is tired, take a break. Keep doing the exercise when you feel better. Do 10 full backward circles. Then, slowly lower your arm to your side. Rest your arm for a moment. Follow steps 1 to 4 again, but this time make slow, forward circles.  W exercise You can do the W exercise while sitting or standing.  Form a W with your arms out to the side and palms facing forward (see Figure 4). Try to bring your hands up so theyre even with your face. If you cant raise your arms that high, bring them to the highest comfortable position. Make sure to limit your range of motion as instructed by your healthcare team. Pinch your shoulder blades together and downward, as if youre squeezing a pencil between them. If you feel discomfort, stop at that position and do the deep breathing exercise. If the discomfort passes, try to bring your arms back a little further. If it doesnt pass, dont reach any further. Hold the furthest position that doesnt cause discomfort. Squeeze your shoulder blades together and downward for 5 seconds. Slowly bring your arms back down to the starting position. Repeat this movement 10 times.  Back Climb You can do the back climb stretch while sitting or standing. Youll need a timer or stopwatch.  Place your hands behind your back. Hold the hand on your affected side with your other hand. If you had surgery on both breasts, use the arm that moves most easily to hold the other. Slowly slide your hands up the center of your back as far as you can. If you feel tightness near your incision, stop at that position and do the deep breathing exercise. If the tightness decreases, try to slide your hands up a little further. If it doesnt decrease, dont slide your hands up any further. Hold the highest position you can for 1 minute. Use your stopwatch or timer to keep track. You should feel a gentle stretch in your  shoulder area. After 1 minute, slowly lower your hands.  Hands behind neck You can do the hands behind neck stretch while sitting or standing. Youll need a timer or stopwatch.  Clasp your hands together on your lap or in front of you. Slowly raise your hands toward your head, keeping your elbows together in front of you, not out to the sides. Keep your head level. Dont bend your neck or head forward.  Slide your hands over your head until you reach the back of your neck. When you get to this point, spread your elbows out to the sides. Hold this position for 1 minute. Use your stopwatch or timer to keep track. Breathe normally. Dont hold your breath as you stretch your body. If you have some tightness across your incision or chest, hold your position and do the deep breathing exercise. If the tightness decreases, continue with the movement. If the tightness stays the same, reach up and stretch your elbows back as best as you can without causing discomfort. Hold the position youre most comfortable in for 1 minute. Slowly come out of the stretch by bringing your elbows together and sliding your hands over your head. Then, slowly lower your arms.  Forward wall crawls Youll need 2 pieces of tape for the forward wall crawl exercise.  Stand facing a wall. Your toes should be about 6 inches (15 centimeters) from the wall. Reach as high as you can with your unaffected arm. [place]ta [place] that point with a piece of tape. This will be the goal for your affected arm. If you had surgery on both breasts, set your goal using the arm that moves most comfortably. Place both hands against the wall at a level thats comfortable. Crawl your fingers up the wall as far as you can, keeping them even with each other.. Try not to look up toward your hands or arch your back. When you get to the point where you feel a good stretch, but not pain, do the deep breathing exercise. Return to the starting position by crawling your  fingers back down the wall. Repeat the wall crawl 10 times. Each time you raise your hands, try to crawl a little bit higher. On the 10th crawl, use the other piece of tape to mark the highest point you reached with your affected arm. This will let you to see your progress each time you do this exercise. As you become more flexible, you may need to take a step closer to the wall so you can reach a little higher.   Side wall crawls Youll also need 2 pieces of tape for the side wall crawl exercise.  You shouldnt feel pain while doing this exercise. Its normal to feel some tightness or pulling across the side of your chest. Focus on your breathing until the tightness decreases. Breathe normally throughout this exercise. Dont hold your breath.  Be careful not to turn your body toward the wall while doing this exercise. Make sure only the side of your body faces the wall.  If you had surgery on both breasts, start with step 3.  Stand with your unaffected side closest to the wall, about 1 foot (30.5 centimeters) away from the wall. Reach as high as you can with your unaffected arm. Elta [place] that point with a piece of tape (see Figure 8). This will be the goal for your affected arm. Turn your body so your affected side is now closest to the wall. If you had surgery on both breasts, start with either side closest to the wall. Crawl your fingers up the wall as far as you can. When you get to the point where you feel a good stretch, but not pain, do the deep breathing exercise. Return to the starting position by crawling your fingers back down the wall. Repeat this exercise 10 times. On your 10th crawl, use a piece of tape to mark the highest point you reached with  your affected arm. This will let you see your progress each time you do the exercise. If you had surgery on both breasts, repeat the exercise with your other arm.  Raise your arm above your head and do hand pumps several times a day. To do  hand pumps, slowly open and close your fist 10 times. This will help drain the fluid out of your arm. Dont hold your arm straight up over your head for more than a few minutes. This can cause your arm muscles to get tired. Raise your arm to the side a few times a day for about 20 minutes at a time. To do this, sit or lie down on your back. Rest your arm on a few pillows next to you so its raised above the level of your heart. If youre able to sleep on your unaffected side, you can place 1 or 2 pillows in front of you and rest your affected arm on them while you sleep. If the swelling doesnt go down within 4 to 6 weeks, call your surgeon or nurse.

## 2020-02-20 NOTE — Interval H&P Note (Signed)
History and Physical Interval Note:  02/20/2020 8:46 AM  Kelli Thompson  has presented today for surgery, with the diagnosis of Right breast mass.  The various methods of treatment have been discussed with the patient and family. After consideration of risks, benefits and other options for treatment, the patient has consented to  Procedure(s): BREAST BIOPSY WITH NEEDLE LOCALIZATION (Right) as a surgical intervention.  The patient's history has been reviewed, patient examined, no change in status, stable for surgery.  I have reviewed the patient's chart and labs.  Questions were answered to the patient's satisfaction.    Marked. No questions or changes.   Virl Cagey

## 2020-02-21 ENCOUNTER — Encounter (HOSPITAL_COMMUNITY): Payer: Self-pay | Admitting: General Surgery

## 2020-02-23 ENCOUNTER — Telehealth (INDEPENDENT_AMBULATORY_CARE_PROVIDER_SITE_OTHER): Payer: 59 | Admitting: General Surgery

## 2020-02-23 DIAGNOSIS — N6310 Unspecified lump in the right breast, unspecified quadrant: Secondary | ICD-10-CM

## 2020-02-23 LAB — SURGICAL PATHOLOGY

## 2020-02-23 NOTE — Telephone Encounter (Signed)
[facility]  Pathology Benign.  FINAL MICROSCOPIC DIAGNOSIS:   A. BREAST, RIGHT, BIOPSY:  - Findings consistent with benign hamartoma.  - Biopsy site.   Curlene Labrum, MD [facility] [address] [place], [place] [address] [phone] (office)

## 2020-02-28 ENCOUNTER — Ambulatory Visit: Payer: 59 | Admitting: Family Medicine

## 2020-02-28 ENCOUNTER — Telehealth (INDEPENDENT_AMBULATORY_CARE_PROVIDER_SITE_OTHER): Payer: Self-pay | Admitting: General Surgery

## 2020-02-28 DIAGNOSIS — R600 Localized edema: Secondary | ICD-10-CM

## 2020-02-28 DIAGNOSIS — N6310 Unspecified lump in the right breast, unspecified quadrant: Secondary | ICD-10-CM

## 2020-02-28 DIAGNOSIS — I10 Essential (primary) hypertension: Secondary | ICD-10-CM

## 2020-02-28 NOTE — Progress Notes (Signed)
[facility]  I am calling the patient for post operative evaluation. This is not a billable encounter as it is under the [facility] charges for the surgery.  The patient had a breast excisional biopsy on 02/20/2020. The patient reports that she is doing well overall but went to work yesterday and is having some burning pain in the area.  She can trim her dermabond. No bleeding or infection. She is having induration. Told her it could be 3 months before that softens.   Pathology results already given and benign.  Will see the patient PRN.  Activity as tolerated.   Curlene Labrum, MD [facility] [address] [place], [place] [address] [phone] (office)

## 2020-03-01 ENCOUNTER — Encounter: Payer: Self-pay | Admitting: Family Medicine

## 2020-03-01 ENCOUNTER — Ambulatory Visit (INDEPENDENT_AMBULATORY_CARE_PROVIDER_SITE_OTHER): Payer: 59 | Admitting: Family Medicine

## 2020-03-01 VITALS — BP 148/91 | HR 92 | Temp 97.5°F

## 2020-03-01 DIAGNOSIS — J988 Other specified respiratory disorders: Secondary | ICD-10-CM

## 2020-03-01 DIAGNOSIS — J029 Acute pharyngitis, unspecified: Secondary | ICD-10-CM

## 2020-03-01 LAB — RAPID STREP SCREEN (MED CTR MEBANE ONLY): Strep Gp A Ag, IA W/Reflex: NEGATIVE

## 2020-03-01 LAB — CULTURE, GROUP A STREP

## 2020-03-01 LAB — VERITOR FLU A/B WAIVED
Influenza A: NEGATIVE
Influenza B: NEGATIVE

## 2020-03-01 MED ORDER — GUAIFENESIN ER 600 MG PO TB12: 1200.0000 mg | ORAL_TABLET | Freq: Two times a day (BID) | ORAL | 1 refills | Status: DC | PRN

## 2020-03-01 MED ORDER — BENZONATATE 100 MG PO CAPS: 100.0000 mg | ORAL_CAPSULE | Freq: Three times a day (TID) | ORAL | 0 refills | Status: DC | PRN

## 2020-03-01 NOTE — Progress Notes (Signed)
Established Patient Office Visit  Subjective:  Patient ID: Kelli Thompson, female    DOB: November 07, 1965  Age: 55 y.o. MRN: [id number]  CC:  Chief Complaint  Patient presents with  . Sore Throat    Sore throat, cough, congestion, sob,     HPI Kelli Thompson presents for cough and head congestion x5 days. Her cough is dry. She also reports a sore throat and chills. She feels like her symptoms have been getting worse. She denies headache, fever, or chest pain. Denies vomiting or diarrhea. She does reports some shortness of breath walking from from the car into the office, but not walking around her house. She has been taking tylenol for her symptoms but nothing else.   Past Medical History:  Diagnosis Date  . Anxiety   . Arthritis   . Depression   . GERD (gastroesophageal reflux disease)   . Hypertension   . Pancreatitis    around 2013; x1 secondary to lisinopril per patient  . Peri-menopause 02/28/2014  . Urinary frequency 01/30/2014  . Urinary incontinence, mixed 01/30/2014    Past Surgical History:  Procedure Laterality Date  . ABDOMINAL HYSTERECTOMY    . APPENDECTOMY    . BREAST BIOPSY Right 02/20/2020   Procedure: BREAST EXCISIONAL BIOPSY AFTER SEED LOCATION;  Surgeon: Virl Cagey, MD;  Location: [facility];  Service: General;  Laterality: Right;  . CESAREAN SECTION     x2  . CHOLECYSTECTOMY    . COLONOSCOPY WITH PROPOFOL N/A 02/02/2020   Procedure: COLONOSCOPY WITH PROPOFOL;  Surgeon: Eloise Harman, DO;  Location: [facility];  Service: Endoscopy;  Laterality: N/A;  8:15am    Family History  Problem Relation Age of Onset  . Hypertension Mother   . Diabetes Mother        borderline  . Hypertension Father   . Asthma Father   . Stroke Father   . Diabetes Maternal Grandmother   . Congestive Heart Failure Maternal Grandmother   . Arthritis Maternal Grandmother   . Parkinson's disease Maternal Grandfather   . Cancer Paternal Grandmother   . Alcohol abuse Paternal  Grandfather   . Other Son        stomach issues  . Colon cancer Neg Hx     Social History   Socioeconomic History  . Marital status: Married    Spouse name: Not on file  . Number of children: Not on file  . Years of education: Not on file  . Highest education level: Not on file  Occupational History  . Not on file  Tobacco Use  . Smoking status: Former Smoker    Packs/day: 0.50    Years: 30.00    Pack years: 15.00    Types: Cigarettes    Quit date: 03/27/2013    Years since quitting: 6.9  . Smokeless tobacco: Never Used  Vaping Use  . Vaping Use: Never used  Substance and Sexual Activity  . Alcohol use: No  . Drug use: No  . Sexual activity: Yes    Birth control/protection: Surgical    Comment: hyst  Other Topics Concern  . Not on file  Social History Narrative  . Not on file   Social Determinants of Health   Financial Resource Strain: Not on file  Food Insecurity: Not on file  Transportation Needs: Not on file  Physical Activity: Not on file  Stress: Not on file  Social Connections: Not on file  Intimate Partner Violence: Not on file  Outpatient Medications Prior to Visit  Medication Sig Dispense Refill  . amLODipine (NORVASC) 10 MG tablet TAKE ONE TABLET BY MOUTH DAILY FOR BLOOD PRESSURE (Patient taking differently: Take 10 mg by mouth every evening. TAKE ONE TABLET BY MOUTH DAILY FOR BLOOD PRESSURE) 90 tablet 0  . carvedilol (COREG) 12.5 MG tablet 1 po BID (Patient taking differently: Take 12.5 mg by mouth 2 (two) times daily with a meal. 1 po BID) 180 tablet 1  . DULoxetine (CYMBALTA) 60 MG capsule Take 1 capsule (60 mg total) by mouth daily with supper. 90 capsule 1  . famotidine (PEPCID) 20 MG tablet Take 1 tablet (20 mg total) by mouth 2 (two) times daily. 180 tablet 3  . furosemide (LASIX) 40 MG tablet Take 1 tablet (40 mg total) by mouth daily. 90 tablet 0  . olmesartan (BENICAR) 40 MG tablet Take 1 tablet (40 mg total) by mouth daily. 90 tablet 0  .  potassium chloride SA (KLOR-CON M20) 20 MEQ tablet TAKE ONE TABLET BY MOUTH DAILY FOR POTASSIUM (Patient taking differently: Take 20 mEq by mouth daily. TAKE ONE TABLET BY MOUTH DAILY FOR POTASSIUM) 30 tablet 3  . pregabalin (LYRICA) 200 MG capsule Take 1 capsule (200 mg total) by mouth at bedtime. 30 capsule 2  . ondansetron (ZOFRAN) 4 MG tablet Take 1 tablet (4 mg total) by mouth every 8 (eight) hours as needed for nausea. 30 tablet 0  . oxyCODONE (ROXICODONE) 5 MG immediate release tablet Take 1 tablet (5 mg total) by mouth every 4 (four) hours as needed for severe pain or breakthrough pain. 10 tablet 0   No facility-administered medications prior to visit.    Allergies  Allergen Reactions  . Ace Inhibitors     pancreatitis    ROS Review of Systems As per HPI.    Objective:    Physical Exam Vitals and nursing note reviewed.  Constitutional:      General: She is not in acute distress.    Appearance: She is well-developed. She is not diaphoretic.  HENT:     Head: Normocephalic and atraumatic.     Nose: Congestion present.     Mouth/Throat:     Mouth: Mucous membranes are moist. No oral lesions.     Pharynx: Posterior oropharyngeal erythema present. No oropharyngeal exudate or uvula swelling.     Tonsils: No tonsillar exudate or tonsillar abscesses.  Eyes:     Conjunctiva/sclera: Conjunctivae normal.     Pupils: Pupils are equal, round, and reactive to light.  Cardiovascular:     Rate and Rhythm: Normal rate and regular rhythm.     Heart sounds: Normal heart sounds. No murmur heard.   Pulmonary:     Effort: Pulmonary effort is normal. No respiratory distress.     Breath sounds: Normal breath sounds. No stridor. No wheezing, rhonchi or rales.  Chest:     Chest wall: No tenderness.  Musculoskeletal:     Cervical back: Neck supple.  Lymphadenopathy:     Cervical: No cervical adenopathy.  Skin:    General: Skin is warm and dry.     Capillary Refill: Capillary refill  takes less than 2 seconds.  Neurological:     General: No focal deficit present.     Mental Status: She is alert and oriented to person, place, and time.  Psychiatric:        Mood and Affect: Mood normal.        Behavior: Behavior normal.     BP Marland Kitchen)  148/91   Pulse 92   Temp (!) 97.5 F (36.4 C) (Temporal)   SpO2 98% Comment: room air Wt Readings from Last 3 Encounters:  02/16/20 235 lb (106.6 kg)  02/07/20 235 lb (106.6 kg)  02/02/20 225 lb (102.1 kg)     Health Maintenance Due  Topic Date Due  . COVID-19 Vaccine (1) Never done  . PAP SMEAR-Modifier  Never done    There are no preventive care reminders to display for this patient.  No results found for: TSH Lab Results  Component Value Date   WBC 7.5 11/29/2019   HGB 13.5 11/29/2019   HCT 40.7 11/29/2019   MCV 89 11/29/2019   PLT 229 11/29/2019   Lab Results  Component Value Date   NA 138 02/16/2020   K 3.6 02/16/2020   CO2 25 02/16/2020   GLUCOSE 157 (H) 02/16/2020   BUN 16 02/16/2020   CREATININE 0.81 02/16/2020   BILITOT 0.2 11/29/2019   ALKPHOS 109 11/29/2019   AST 18 11/29/2019   ALT 22 11/29/2019   PROT 6.8 11/29/2019   ALBUMIN 4.3 11/29/2019   CALCIUM 8.9 02/16/2020   ANIONGAP 9 02/16/2020   Lab Results  Component Value Date   CHOL 160 11/29/2019   Lab Results  Component Value Date   HDL 33 (L) 11/29/2019   Lab Results  Component Value Date   LDLCALC 105 (H) 11/29/2019   Lab Results  Component Value Date   TRIG 118 11/29/2019   Lab Results  Component Value Date   CHOLHDL 4.8 (H) 11/29/2019   Lab Results  Component Value Date   HGBA1C 5.2 02/16/2017      Assessment & Plan:   Ereka was seen today for sore throat.  Diagnoses and all orders for this visit:  Respiratory infection Negative flu. Mucinex and nasal saline for congestion. Tessalon perles for cough. Tylenol as needed. Push fluids. Monitor symptoms closly and notify provider for new or worsening symptoms. Go to ED  for chest pain or worsening shortness of breath.  -     Novel Coronavirus, NAA ([facility]) -     Veritor Flu A/B Waived -     Rapid Strep Screen ([facility] ONLY) -     benzonatate (TESSALON PERLES) 100 MG capsule; Take 1 capsule (100 mg total) by mouth 3 (three) times daily as needed for cough. -     guaiFENesin (MUCINEX) 600 MG 12 hr tablet; Take 2 tablets (1,200 mg total) by mouth 2 (two) times daily as needed for to loosen phlegm.  Sore throat Viral. Negative strep. Stay well hydrated. OTC throat lozenges, cool fluids as needed.  -     Novel Coronavirus, NAA ([facility]) -     Veritor Flu A/B Waived -     Rapid Strep Screen ([facility] ONLY)   Follow-up: Return if symptoms worsen or fail to improve.   The patient indicates understanding of these issues and agrees with the plan.  Gwenlyn Perking, FNP

## 2020-03-02 ENCOUNTER — Other Ambulatory Visit: Payer: Self-pay | Admitting: Family Medicine

## 2020-03-02 DIAGNOSIS — I10 Essential (primary) hypertension: Secondary | ICD-10-CM

## 2020-03-02 DIAGNOSIS — R600 Localized edema: Secondary | ICD-10-CM

## 2020-03-02 LAB — SARS-COV-2, NAA 2 DAY TAT

## 2020-03-02 LAB — NOVEL CORONAVIRUS, NAA: SARS-CoV-2, NAA: DETECTED — AB

## 2020-03-03 ENCOUNTER — Telehealth: Payer: Self-pay | Admitting: Physician Assistant

## 2020-03-03 NOTE — Telephone Encounter (Signed)
Called to discuss with [name] about Covid symptoms and the use of sotrovimab, remdisivir or oral therapies for those with mild to moderate Covid symptoms and at a high risk of hospitalization.     Pt is qualified due to co-morbid conditions and/or a member of an at-risk group (not boosted, COPD, BMI, SVI), however declines infusion at this time. SHe is on day 7 and feeling much better. Symptoms tier reviewed as well as criteria for ending isolation.  Symptoms reviewed that would warrant ED/Hospital evaluation. Preventative practices reviewed. Patient verbalized understanding. Patient advised to call back if he decides that he does want to get infusion. Callback number to the infusion center given. Patient advised to go to Urgent care or ED with severe symptoms. .     Patient Active Problem List   Diagnosis Date Noted  . Breast mass, right 02/07/2020  . Colon cancer screening 01/13/2020  . Cervical disc disease with myelopathy 05/10/2019  . Cervicalgia 02/16/2019  . Localized edema 04/30/2015  . Peri-menopause 02/28/2014  . Urinary incontinence, mixed 01/30/2014  . Hypokalemia 04/18/2013  . Essential hypertension 04/17/2013    Angelena Form PA-C

## 2020-03-06 ENCOUNTER — Telehealth: Payer: Self-pay

## 2020-03-06 NOTE — Telephone Encounter (Signed)
Pt informed to continue hydration, take Mucinex, Tylenol as needed. Continue Tessalon. She should wait until symptoms have improved before returning to work.

## 2020-03-06 NOTE — Telephone Encounter (Signed)
  Incoming Patient Call  03/06/2020  What symptoms do you have? Hoarse and coughing and a little bit of sinus, yesterday throat started hurting   How long have you been sick? Since last week,   Have you been seen for this problem? Yes covid clinic on 03/01/20, was told to take muccinex but wants to know if there is anything else she can take   If your provider decides to give you a prescription, which pharmacy would you like for it to be sent to? [facility] [address] [place]   Patient informed that this information will be sent to the clinical staff for review and that they should receive a follow up call.

## 2020-03-08 ENCOUNTER — Ambulatory Visit: Payer: 59 | Admitting: Family Medicine

## 2020-03-16 ENCOUNTER — Ambulatory Visit (INDEPENDENT_AMBULATORY_CARE_PROVIDER_SITE_OTHER): Payer: 59 | Admitting: Family Medicine

## 2020-03-16 ENCOUNTER — Other Ambulatory Visit: Payer: Self-pay

## 2020-03-16 ENCOUNTER — Encounter: Payer: Self-pay | Admitting: Family Medicine

## 2020-03-16 VITALS — BP 136/88 | HR 81 | Temp 97.2°F | Resp 20 | Ht 65.0 in | Wt 236.1 lb

## 2020-03-16 DIAGNOSIS — F32A Depression, unspecified: Secondary | ICD-10-CM | POA: Diagnosis not present

## 2020-03-16 DIAGNOSIS — I10 Essential (primary) hypertension: Secondary | ICD-10-CM

## 2020-03-16 DIAGNOSIS — S46919A Strain of unspecified muscle, fascia and tendon at shoulder and upper arm level, unspecified arm, initial encounter: Secondary | ICD-10-CM | POA: Diagnosis not present

## 2020-03-16 DIAGNOSIS — M5000 Cervical disc disorder with myelopathy, unspecified cervical region: Secondary | ICD-10-CM | POA: Diagnosis not present

## 2020-03-16 DIAGNOSIS — X503XXA Overexertion from repetitive movements, initial encounter: Secondary | ICD-10-CM

## 2020-03-16 MED ORDER — OLMESARTAN MEDOXOMIL 40 MG PO TABS: 40.0000 mg | ORAL_TABLET | Freq: Every day | ORAL | 1 refills | Status: DC

## 2020-03-16 MED ORDER — DULOXETINE HCL 60 MG PO CPEP: 60.0000 mg | ORAL_CAPSULE | Freq: Every day | ORAL | 1 refills | Status: DC

## 2020-03-16 MED ORDER — PREGABALIN 200 MG PO CAPS: 200.0000 mg | ORAL_CAPSULE | Freq: Every day | ORAL | 1 refills | Status: DC

## 2020-03-16 MED ORDER — CARVEDILOL 12.5 MG PO TABS: ORAL_TABLET | ORAL | 1 refills | Status: DC

## 2020-03-16 NOTE — Progress Notes (Signed)
Subjective:  Patient ID: Kelli Thompson, female    DOB: 02-02-1965  Age: 55 y.o. MRN: [id number]  CC: Follow up weight   HPI [name] presents for recheck Lyrica. Helping a lot. Sleeping well. She still works a lot with her shoulders and has pain by the end of her shift.  She does want to lose weight and she discussed a 30-day diet which she had done in the past that was quite drastic and that portions were limited dairy and carbs were prohibited.  She did lose 30 pounds and 30 g but drink back almost immediately.  Depression screen [facility] 2/9 03/16/2020 11/29/2019 02/16/2019  Decreased Interest 0 0 0  Down, Depressed, Hopeless 0 0 0  PHQ - 2 Score 0 0 0  Altered sleeping - - 3  Tired, decreased energy - - 0  Change in appetite - - 1  Feeling bad or failure about yourself  - - 0  Trouble concentrating - - 0  Moving slowly or fidgety/restless - - 0  Suicidal thoughts - - 0  PHQ-9 Score - - 4  Difficult doing work/chores - - Not difficult at all    History Rickie has a past medical history of Anxiety, Arthritis, Depression, GERD (gastroesophageal reflux disease), Hypertension, Pancreatitis, Peri-menopause (02/28/2014), Urinary frequency (01/30/2014), and Urinary incontinence, mixed (01/30/2014).   She has a past surgical history that includes Cesarean section; Cholecystectomy; Appendectomy; Abdominal hysterectomy; Colonoscopy with propofol (N/A, 02/02/2020); and Breast biopsy (Right, 02/20/2020).   Her family history includes Alcohol abuse in her paternal grandfather; Arthritis in her maternal grandmother; Asthma in her father; Cancer in her paternal grandmother; Congestive Heart Failure in her maternal grandmother; Diabetes in her maternal grandmother and mother; Hypertension in her father and mother; Other in her son; Parkinson's disease in her maternal grandfather; Stroke in her father.She reports that she quit smoking about 6 years ago. Her smoking use included cigarettes. She has a 15.00  pack-year smoking history. She has never used smokeless tobacco. She reports that she does not drink alcohol and does not use drugs.    ROS Review of Systems  Musculoskeletal: Positive for arthralgias (bilateral shoulder pain with exertion).    Objective:  BP 136/88   Pulse 81   Temp (!) 97.2 F (36.2 C) (Temporal)   Resp 20   Ht 5\' 5"  (1.651 m)   Wt 236 lb 2 oz (107.1 kg)   SpO2 95%   BMI 39.29 kg/m   BP Readings from Last 3 Encounters:  03/16/20 136/88  03/01/20 (!) 148/91  02/20/20 105/68    Wt Readings from Last 3 Encounters:  03/16/20 236 lb 2 oz (107.1 kg)  02/16/20 235 lb (106.6 kg)  02/07/20 235 lb (106.6 kg)     Physical Exam Constitutional:      General: She is not in acute distress.    Appearance: She is well-developed.  HENT:     Head: Normocephalic and atraumatic.  Eyes:     Conjunctiva/sclera: Conjunctivae normal.     Pupils: Pupils are equal, round, and reactive to light.  Neck:     Thyroid: No thyromegaly.  Cardiovascular:     Rate and Rhythm: Normal rate and regular rhythm.     Heart sounds: Normal heart sounds. No murmur heard.   Pulmonary:     Effort: Pulmonary effort is normal. No respiratory distress.     Breath sounds: Normal breath sounds. No wheezing or rales.  Abdominal:     General: Bowel  sounds are normal. There is no distension.     Palpations: Abdomen is soft.     Tenderness: There is no abdominal tenderness.  Musculoskeletal:        General: Normal range of motion.     Cervical back: Normal range of motion and neck supple.  Lymphadenopathy:     Cervical: No cervical adenopathy.  Skin:    General: Skin is warm and dry.  Neurological:     Mental Status: She is alert and oriented to person, place, and time.  Psychiatric:        Behavior: Behavior normal.        Thought Content: Thought content normal.        Judgment: Judgment normal.       Assessment & Plan:   Delorice was seen today for follow up  weight.  Diagnoses and all orders for this visit:  Repetitive strain injury of shoulder, unspecified laterality, initial encounter -     Ambulatory referral to Physical Therapy  Cervical disc disease with myelopathy -     pregabalin (LYRICA) 200 MG capsule; Take 1 capsule (200 mg total) by mouth at bedtime.  Essential hypertension -     carvedilol (COREG) 12.5 MG tablet; 1 po BID -     olmesartan (BENICAR) 40 MG tablet; Take 1 tablet (40 mg total) by mouth daily.  Depression, unspecified depression type -     DULoxetine (CYMBALTA) 60 MG capsule; Take 1 capsule (60 mg total) by mouth daily with supper.       I have discontinued Brayden W. Murguia's benzonatate and guaiFENesin. I am also having her maintain her potassium chloride SA, famotidine, amLODipine, furosemide, carvedilol, DULoxetine, olmesartan, and pregabalin.  Allergies as of 03/16/2020      Reactions   Ace Inhibitors    pancreatitis      Medication List       Accurate as of March 16, 2020  4:03 PM. If you have any questions, ask your nurse or doctor.        STOP taking these medications   benzonatate 100 MG capsule Commonly known as: Best boy Stopped by: Claretta Fraise, MD   guaiFENesin 600 MG 12 hr tablet Commonly known as: Mucinex Stopped by: Claretta Fraise, MD     TAKE these medications   amLODipine 10 MG tablet Commonly known as: NORVASC TAKE ONE TABLET BY MOUTH DAILY FOR BLOOD PRESSURE   carvedilol 12.5 MG tablet Commonly known as: COREG 1 po BID What changed:   how much to take  how to take this  when to take this   DULoxetine 60 MG capsule Commonly known as: CYMBALTA Take 1 capsule (60 mg total) by mouth daily with supper.   famotidine 20 MG tablet Commonly known as: PEPCID Take 1 tablet (20 mg total) by mouth 2 (two) times daily.   furosemide 40 MG tablet Commonly known as: LASIX TAKE ONE TABLET BY MOUTH DAILY   olmesartan 40 MG tablet Commonly known as: BENICAR Take  1 tablet (40 mg total) by mouth daily.   potassium chloride SA 20 MEQ tablet Commonly known as: Klor-Con M20 TAKE ONE TABLET BY MOUTH DAILY FOR POTASSIUM What changed:   how much to take  how to take this  when to take this   pregabalin 200 MG capsule Commonly known as: Lyrica Take 1 capsule (200 mg total) by mouth at bedtime.        Follow-up: No follow-ups on file.  Claretta Fraise, M.D.

## 2020-06-13 ENCOUNTER — Ambulatory Visit: Payer: 59 | Admitting: Family Medicine

## 2020-06-20 ENCOUNTER — Ambulatory Visit: Payer: Self-pay | Admitting: Family Medicine

## 2020-06-23 ENCOUNTER — Other Ambulatory Visit: Payer: Self-pay | Admitting: Family Medicine

## 2020-06-23 DIAGNOSIS — R600 Localized edema: Secondary | ICD-10-CM

## 2020-06-28 ENCOUNTER — Other Ambulatory Visit: Payer: Self-pay | Admitting: Family Medicine

## 2020-06-28 DIAGNOSIS — E876 Hypokalemia: Secondary | ICD-10-CM

## 2020-07-12 ENCOUNTER — Ambulatory Visit: Payer: Self-pay | Admitting: Family Medicine

## 2020-08-06 ENCOUNTER — Ambulatory Visit (INDEPENDENT_AMBULATORY_CARE_PROVIDER_SITE_OTHER): Payer: 59 | Admitting: Family Medicine

## 2020-08-06 ENCOUNTER — Encounter: Payer: Self-pay | Admitting: Family Medicine

## 2020-08-06 ENCOUNTER — Other Ambulatory Visit: Payer: Self-pay

## 2020-08-06 VITALS — BP 126/93 | HR 102 | Temp 97.6°F | Ht 65.0 in | Wt 233.2 lb

## 2020-08-06 DIAGNOSIS — M5000 Cervical disc disorder with myelopathy, unspecified cervical region: Secondary | ICD-10-CM

## 2020-08-06 DIAGNOSIS — I10 Essential (primary) hypertension: Secondary | ICD-10-CM

## 2020-08-06 DIAGNOSIS — Z79899 Other long term (current) drug therapy: Secondary | ICD-10-CM

## 2020-08-06 DIAGNOSIS — E876 Hypokalemia: Secondary | ICD-10-CM

## 2020-08-06 DIAGNOSIS — R600 Localized edema: Secondary | ICD-10-CM

## 2020-08-06 DIAGNOSIS — F32A Depression, unspecified: Secondary | ICD-10-CM | POA: Diagnosis not present

## 2020-08-06 MED ORDER — AMLODIPINE BESYLATE 10 MG PO TABS: ORAL_TABLET | ORAL | 3 refills | Status: DC

## 2020-08-06 MED ORDER — FUROSEMIDE 40 MG PO TABS: 40.0000 mg | ORAL_TABLET | Freq: Every day | ORAL | 3 refills | Status: DC

## 2020-08-06 MED ORDER — OLMESARTAN MEDOXOMIL 40 MG PO TABS: 40.0000 mg | ORAL_TABLET | Freq: Every day | ORAL | 3 refills | Status: DC

## 2020-08-06 MED ORDER — KETOROLAC TROMETHAMINE 60 MG/2ML IM SOLN
60.0000 mg | Freq: Once | INTRAMUSCULAR | Status: AC
  Administered 2020-08-06: 60 mg via INTRAMUSCULAR

## 2020-08-06 MED ORDER — DULOXETINE HCL 60 MG PO CPEP
60.0000 mg | ORAL_CAPSULE | Freq: Every day | ORAL | 3 refills | Status: DC
Start: 2020-08-06 — End: 2021-10-28

## 2020-08-06 MED ORDER — BETAMETHASONE SOD PHOS & ACET 6 (3-3) MG/ML IJ SUSP
6.0000 mg | Freq: Once | INTRAMUSCULAR | Status: AC
  Administered 2020-08-06: 6 mg via INTRAMUSCULAR

## 2020-08-06 MED ORDER — FAMOTIDINE 20 MG PO TABS: 20.0000 mg | ORAL_TABLET | Freq: Two times a day (BID) | ORAL | 3 refills | Status: DC

## 2020-08-06 MED ORDER — PREGABALIN 300 MG PO CAPS: 300.0000 mg | ORAL_CAPSULE | Freq: Every day | ORAL | 1 refills | Status: DC

## 2020-08-06 MED ORDER — POTASSIUM CHLORIDE CRYS ER 20 MEQ PO TBCR: EXTENDED_RELEASE_TABLET | ORAL | 3 refills | Status: DC

## 2020-08-06 MED ORDER — CARVEDILOL 12.5 MG PO TABS: ORAL_TABLET | ORAL | 3 refills | Status: DC

## 2020-08-06 NOTE — Progress Notes (Signed)
Subjective:  Patient ID: Kelli Thompson, female    DOB: August 30, 1965  Age: 55 y.o. MRN: [id number]  CC: Medical Management of Chronic Issues   HPI Kelli Thompson presents for increasing neck pain. Lifting laundry exacerbated. MRI of Cervical spine showed disc flattening the cord at C6. Feels a knot at right postauricular area. Tightness at right posterior neck radiating to shoulder blade. Pain is running  5-8/10. Using tylenol and ibuprofen.   Patient in for follow-up of GERD. Currently asymptomatic taking  PPI daily. There is frequent heartburn recently when she ran out of famotidine . No hematemesis and no melena. No dysphagia or choking. Onset is remote. Progression is stable. Complicating factors, none. Dsepression stable on current med which is well tolerated.  Depression screen [facility] 2/9 08/06/2020 08/06/2020 03/16/2020 11/29/2019 02/16/2019  Decreased Interest 0 0 0 0 0  Down, Depressed, Hopeless 0 0 0 0 0  PHQ - 2 Score 0 0 0 0 0  Altered sleeping 3 - - - 3  Tired, decreased energy 0 - - - 0  Change in appetite 0 - - - 1  Feeling bad or failure about yourself  0 - - - 0  Trouble concentrating 1 - - - 0  Moving slowly or fidgety/restless 0 - - - 0  Suicidal thoughts 0 - - - 0  PHQ-9 Score 4 - - - 4  Difficult doing work/chores Somewhat difficult - - - Not difficult at all     History Kelli Thompson has a past medical history of Anxiety, Arthritis, Depression, GERD (gastroesophageal reflux disease), Hypertension, Pancreatitis, Peri-menopause (02/28/2014), Urinary frequency (01/30/2014), and Urinary incontinence, mixed (01/30/2014).   She has a past surgical history that includes Cesarean section; Cholecystectomy; Appendectomy; Abdominal hysterectomy; Colonoscopy with propofol (N/A, 02/02/2020); and Breast biopsy (Right, 02/20/2020).   Her family history includes Alcohol abuse in her paternal grandfather; Arthritis in her maternal grandmother; Asthma in her father; Cancer in her paternal  grandmother; Congestive Heart Failure in her maternal grandmother; Diabetes in her maternal grandmother and mother; Hypertension in her father and mother; Other in her son; Parkinson's disease in her maternal grandfather; Stroke in her father.She reports that she quit smoking about 7 years ago. Her smoking use included cigarettes. She has a 15.00 pack-year smoking history. She has never used smokeless tobacco. She reports that she does not drink alcohol and does not use drugs.    ROS Review of Systems  Constitutional: Negative.   HENT: Negative.    Eyes:  Negative for visual disturbance.  Respiratory:  Negative for shortness of breath.   Cardiovascular:  Negative for chest pain.  Gastrointestinal:  Negative for abdominal pain.  Musculoskeletal:  Positive for arthralgias, neck pain and neck stiffness.   Objective:  BP (!) 126/93   Pulse (!) 102   Temp 97.6 F (36.4 C)   Ht 5' 5"  (1.651 m)   Wt 233 lb 3.2 oz (105.8 kg)   SpO2 98%   BMI 38.81 kg/m   BP Readings from Last 3 Encounters:  08/06/20 (!) 126/93  03/16/20 136/88  03/01/20 (!) 148/91    Wt Readings from Last 3 Encounters:  08/06/20 233 lb 3.2 oz (105.8 kg)  03/16/20 236 lb 2 oz (107.1 kg)  02/16/20 235 lb (106.6 kg)     Physical Exam Constitutional:      General: She is not in acute distress.    Appearance: She is well-developed.  Cardiovascular:     Rate and Rhythm: Normal rate and  regular rhythm.  Pulmonary:     Breath sounds: Normal breath sounds.  Musculoskeletal:        General: Normal range of motion.  Skin:    General: Skin is warm and dry.  Neurological:     Mental Status: She is alert and oriented to person, place, and time.      Assessment & Plan:   Kelli Thompson was seen today for medical management of chronic issues.  Diagnoses and all orders for this visit:  Cervical disc disease with myelopathy -     Ambulatory referral to Neurosurgery -     pregabalin (LYRICA) 300 MG capsule; Take 1  capsule (300 mg total) by mouth at bedtime. -     betamethasone acetate-betamethasone sodium phosphate (CELESTONE) injection 6 mg -     ketorolac (TORADOL) injection 60 mg  Controlled substance agreement signed -     ToxASSURE Select 13 (MW), Urine  Essential hypertension -     amLODipine (NORVASC) 10 MG tablet; TAKE ONE TABLET BY MOUTH DAILY FOR BLOOD PRESSURE -     carvedilol (COREG) 12.5 MG tablet; 1 po BID -     olmesartan (BENICAR) 40 MG tablet; Take 1 tablet (40 mg total) by mouth daily. -     CMP14+EGFR  Depression, unspecified depression type -     DULoxetine (CYMBALTA) 60 MG capsule; Take 1 capsule (60 mg total) by mouth daily with supper.  Localized edema -     furosemide (LASIX) 40 MG tablet; Take 1 tablet (40 mg total) by mouth daily.  Hypokalemia -     potassium chloride SA (KLOR-CON M20) 20 MEQ tablet; TAKE ONE TABLET BY MOUTH DAILY FOR POTASSIUM -     CMP14+EGFR  Other orders -     famotidine (PEPCID) 20 MG tablet; Take 1 tablet (20 mg total) by mouth 2 (two) times daily.      I have changed Kelli Thompson's pregabalin and furosemide. I am also having her maintain her amLODipine, carvedilol, DULoxetine, famotidine, olmesartan, and potassium chloride SA. We administered betamethasone acetate-betamethasone sodium phosphate and ketorolac.  Allergies as of 08/06/2020       Reactions   Ace Inhibitors    pancreatitis        Medication List        Accurate as of August 06, 2020 11:59 PM. If you have any questions, ask your nurse or doctor.          amLODipine 10 MG tablet Commonly known as: NORVASC TAKE ONE TABLET BY MOUTH DAILY FOR BLOOD PRESSURE   carvedilol 12.5 MG tablet Commonly known as: COREG 1 po BID   DULoxetine 60 MG capsule Commonly known as: CYMBALTA Take 1 capsule (60 mg total) by mouth daily with supper.   famotidine 20 MG tablet Commonly known as: PEPCID Take 1 tablet (20 mg total) by mouth 2 (two) times daily.   furosemide 40  MG tablet Commonly known as: LASIX Take 1 tablet (40 mg total) by mouth daily.   olmesartan 40 MG tablet Commonly known as: BENICAR Take 1 tablet (40 mg total) by mouth daily.   potassium chloride SA 20 MEQ tablet Commonly known as: Klor-Con M20 TAKE ONE TABLET BY MOUTH DAILY FOR POTASSIUM   pregabalin 300 MG capsule Commonly known as: Lyrica Take 1 capsule (300 mg total) by mouth at bedtime. What changed:  medication strength how much to take Changed by: Claretta Fraise, MD         Follow-up: Return in about  6 weeks (around 09/17/2020).  Claretta Fraise, M.D.

## 2020-08-10 LAB — TOXASSURE SELECT 13 (MW), URINE

## 2020-08-24 ENCOUNTER — Other Ambulatory Visit: Payer: Self-pay

## 2020-08-24 ENCOUNTER — Ambulatory Visit (INDEPENDENT_AMBULATORY_CARE_PROVIDER_SITE_OTHER): Payer: 59 | Admitting: Nurse Practitioner

## 2020-08-24 ENCOUNTER — Encounter: Payer: Self-pay | Admitting: Nurse Practitioner

## 2020-08-24 VITALS — BP 103/68 | HR 91 | Temp 98.1°F | Ht 65.0 in | Wt 233.0 lb

## 2020-08-24 DIAGNOSIS — M25512 Pain in left shoulder: Secondary | ICD-10-CM | POA: Diagnosis not present

## 2020-08-24 MED ORDER — METHOCARBAMOL 500 MG PO TABS
500.0000 mg | ORAL_TABLET | Freq: Four times a day (QID) | ORAL | 0 refills | Status: DC
Start: 2020-08-24 — End: 2020-09-17

## 2020-08-24 MED ORDER — IBUPROFEN 600 MG PO TABS: 600.0000 mg | ORAL_TABLET | Freq: Three times a day (TID) | ORAL | 0 refills | Status: DC | PRN

## 2020-08-24 NOTE — Assessment & Plan Note (Signed)
Uncontrolled left shoulder pain radiating to hand in the last 3 days.  Patient overstretched and strained shoulders.  Ice pack application with no therapeutic effect, started patient on 600 mg ibuprofen by mouth as needed, Robaxin muscle relaxant for 5 days.  Patient knows to follow-up with worsening unresolved symptoms.  Rx sent to pharmacy.

## 2020-08-24 NOTE — Patient Instructions (Signed)

## 2020-08-24 NOTE — Progress Notes (Signed)
Acute Office Visit  Subjective:    Patient ID: Kelli Thompson, female    DOB: 1965/03/06, 55 y.o.   MRN: [id number]  Chief Complaint  Patient presents with   Elbow Pain    HPI Patient is in today for Pain  She reports new onset left shoulder pain. was not an injury that may have caused the pain. The pain started a few days ago and is staying constant. The pain does radiate shoulder to hand. The pain is described as aching and soreness, is 5/10 in intensity, occurring constantly. Symptoms are worse in the: morning, mid-day, afternoon, evening  Aggravating factors: none Relieving factors: none.  She has tried application of ice with no relief.  Patient reports symptoms started when she tried to stretch and believes she overstretched her arms.  ---------------------------------------------------------------------------------------------------   Past Medical History:  Diagnosis Date   Anxiety    Arthritis    Depression    GERD (gastroesophageal reflux disease)    Hypertension    Pancreatitis    around 2013; x1 secondary to lisinopril per patient   Peri-menopause 02/28/2014   Urinary frequency 01/30/2014   Urinary incontinence, mixed 01/30/2014    Past Surgical History:  Procedure Laterality Date   ABDOMINAL HYSTERECTOMY     APPENDECTOMY     BREAST BIOPSY Right 02/20/2020   Procedure: BREAST EXCISIONAL BIOPSY AFTER SEED LOCATION;  Surgeon: Virl Cagey, MD;  Location: [facility];  Service: General;  Laterality: Right;   CESAREAN SECTION     x2   CHOLECYSTECTOMY     COLONOSCOPY WITH PROPOFOL N/A 02/02/2020   Procedure: COLONOSCOPY WITH PROPOFOL;  Surgeon: Eloise Harman, DO;  Location: [facility];  Service: Endoscopy;  Laterality: N/A;  8:15am    Family History  Problem Relation Age of Onset   Hypertension Mother    Diabetes Mother        borderline   Hypertension Father    Asthma Father    Stroke Father    Diabetes Maternal Grandmother    Congestive Heart  Failure Maternal Grandmother    Arthritis Maternal Grandmother    Parkinson's disease Maternal Grandfather    Cancer Paternal Grandmother    Alcohol abuse Paternal Grandfather    Other Son        stomach issues   Colon cancer Neg Hx     Social History   Socioeconomic History   Marital status: Married    Spouse name: Not on file   Number of children: Not on file   Years of education: Not on file   Highest education level: Not on file  Occupational History   Not on file  Tobacco Use   Smoking status: Former    Packs/day: 0.50    Years: 30.00    Pack years: 15.00    Types: Cigarettes    Quit date: 03/27/2013    Years since quitting: 7.4   Smokeless tobacco: Never  Vaping Use   Vaping Use: Never used  Substance and Sexual Activity   Alcohol use: No   Drug use: No   Sexual activity: Yes    Birth control/protection: Surgical    Comment: hyst  Other Topics Concern   Not on file  Social History Narrative   Not on file   Social Determinants of Health   Financial Resource Strain: Not on file  Food Insecurity: Not on file  Transportation Needs: Not on file  Physical Activity: Not on file  Stress: Not on file  Social Connections: Not on file  Intimate Partner Violence: Not on file    Outpatient Medications Prior to Visit  Medication Sig Dispense Refill   amLODipine (NORVASC) 10 MG tablet TAKE ONE TABLET BY MOUTH DAILY FOR BLOOD PRESSURE 90 tablet 3   carvedilol (COREG) 12.5 MG tablet 1 po BID 180 tablet 3   DULoxetine (CYMBALTA) 60 MG capsule Take 1 capsule (60 mg total) by mouth daily with supper. 90 capsule 3   famotidine (PEPCID) 20 MG tablet Take 1 tablet (20 mg total) by mouth 2 (two) times daily. 180 tablet 3   furosemide (LASIX) 40 MG tablet Take 1 tablet (40 mg total) by mouth daily. 90 tablet 3   olmesartan (BENICAR) 40 MG tablet Take 1 tablet (40 mg total) by mouth daily. 90 tablet 3   potassium chloride SA (KLOR-CON M20) 20 MEQ tablet TAKE ONE TABLET BY MOUTH  DAILY FOR POTASSIUM 90 tablet 3   pregabalin (LYRICA) 300 MG capsule Take 1 capsule (300 mg total) by mouth at bedtime. 90 capsule 1   No facility-administered medications prior to visit.    Allergies  Allergen Reactions   Ace Inhibitors     pancreatitis    Review of Systems  Constitutional: Negative.   HENT: Negative.    Respiratory: Negative.    Cardiovascular: Negative.   Skin:  Negative for rash.  All other systems reviewed and are negative.     Objective:    Physical Exam Vitals and nursing note reviewed.  Constitutional:      Appearance: Normal appearance.  HENT:     Head: Normocephalic.     Mouth/Throat:     Mouth: Mucous membranes are moist.     Pharynx: Oropharynx is clear.  Eyes:     Conjunctiva/sclera: Conjunctivae normal.  Cardiovascular:     Rate and Rhythm: Normal rate and regular rhythm.     Pulses: Normal pulses.     Heart sounds: Normal heart sounds.  Pulmonary:     Effort: Pulmonary effort is normal.     Breath sounds: Normal breath sounds.  Abdominal:     General: Bowel sounds are normal.  Musculoskeletal:     Left shoulder: Tenderness present. No swelling or deformity. Decreased range of motion.  Skin:    Findings: No rash.  Neurological:     Mental Status: She is alert and oriented to person, place, and time.    BP 103/68   Pulse 91   Temp 98.1 F (36.7 C) (Temporal)   Ht '5\' 5"'$  (1.651 m)   Wt 233 lb (105.7 kg)   SpO2 97%   BMI 38.77 kg/m  Wt Readings from Last 3 Encounters:  08/24/20 233 lb (105.7 kg)  08/06/20 233 lb 3.2 oz (105.8 kg)  03/16/20 236 lb 2 oz (107.1 kg)    Health Maintenance Due  Topic Date Due   COVID-19 Vaccine (1) Never done   Pneumococcal Vaccine 63-6 Years old (1 - PCV) Never done   Zoster Vaccines- Shingrix (1 of 2) Never done   PAP SMEAR-Modifier  Never done    There are no preventive care reminders to display for this patient.   No results found for: TSH Lab Results  Component Value Date   WBC  7.5 11/29/2019   HGB 13.5 11/29/2019   HCT 40.7 11/29/2019   MCV 89 11/29/2019   PLT 229 11/29/2019   Lab Results  Component Value Date   NA 138 02/16/2020   K 3.6 02/16/2020   CO2  25 02/16/2020   GLUCOSE 157 (H) 02/16/2020   BUN 16 02/16/2020   CREATININE 0.81 02/16/2020   BILITOT 0.2 11/29/2019   ALKPHOS 109 11/29/2019   AST 18 11/29/2019   ALT 22 11/29/2019   PROT 6.8 11/29/2019   ALBUMIN 4.3 11/29/2019   CALCIUM 8.9 02/16/2020   ANIONGAP 9 02/16/2020   Lab Results  Component Value Date   CHOL 160 11/29/2019   Lab Results  Component Value Date   HDL 33 (L) 11/29/2019   Lab Results  Component Value Date   LDLCALC 105 (H) 11/29/2019   Lab Results  Component Value Date   TRIG 118 11/29/2019   Lab Results  Component Value Date   CHOLHDL 4.8 (H) 11/29/2019   Lab Results  Component Value Date   HGBA1C 5.2 02/16/2017       Assessment & Plan:   Problem List Items Addressed This Visit       Other   Acute pain of left shoulder - Primary    Uncontrolled left shoulder pain radiating to hand in the last 3 days.  Patient overstretched and strained shoulders.  Ice pack application with no therapeutic effect, started patient on 600 mg ibuprofen by mouth as needed, Robaxin muscle relaxant for 5 days.  Patient knows to follow-up with worsening unresolved symptoms.  Rx sent to pharmacy.       Relevant Medications   ibuprofen (ADVIL) 600 MG tablet   methocarbamol (ROBAXIN) 500 MG tablet     Meds ordered this encounter  Medications   ibuprofen (ADVIL) 600 MG tablet    Sig: Take 1 tablet (600 mg total) by mouth every 8 (eight) hours as needed.    Dispense:  30 tablet    Refill:  0    Order Specific Question:   Supervising Provider    Answer:   Janora Norlander [id number]   methocarbamol (ROBAXIN) 500 MG tablet    Sig: Take 1 tablet (500 mg total) by mouth 4 (four) times daily.    Dispense:  20 tablet    Refill:  0    Order Specific Question:   Supervising  Provider    Answer:   Janora Norlander [id number]     Ivy Lynn, NP

## 2020-09-17 ENCOUNTER — Encounter: Payer: Self-pay | Admitting: Family Medicine

## 2020-09-17 ENCOUNTER — Other Ambulatory Visit: Payer: Self-pay

## 2020-09-17 ENCOUNTER — Ambulatory Visit (INDEPENDENT_AMBULATORY_CARE_PROVIDER_SITE_OTHER): Payer: 59 | Admitting: Family Medicine

## 2020-09-17 VITALS — BP 130/82 | HR 77 | Temp 97.1°F | Ht 65.0 in | Wt 241.0 lb

## 2020-09-17 DIAGNOSIS — G959 Disease of spinal cord, unspecified: Secondary | ICD-10-CM

## 2020-09-17 DIAGNOSIS — M25512 Pain in left shoulder: Secondary | ICD-10-CM | POA: Diagnosis not present

## 2020-09-17 DIAGNOSIS — M5000 Cervical disc disorder with myelopathy, unspecified cervical region: Secondary | ICD-10-CM

## 2020-09-17 MED ORDER — PREGABALIN 200 MG PO CAPS: 400.0000 mg | ORAL_CAPSULE | Freq: Every day | ORAL | 2 refills | Status: DC

## 2020-09-17 MED ORDER — METHOCARBAMOL 500 MG PO TABS
500.0000 mg | ORAL_TABLET | Freq: Four times a day (QID) | ORAL | 1 refills | Status: DC
Start: 2020-09-17 — End: 2021-05-11

## 2020-09-17 NOTE — Progress Notes (Signed)
Subjective:  Patient ID: Kelli Thompson, female    DOB: October 05, 1965  Age: 55 y.o. MRN: [id number]  CC: Follow-up   HPI Kelli Thompson presents for right poterior neck to mastoid is point of max pain. Like a charlie  horse. Pain is stable. 6-8/10. Having numbness and weakness at the RUE.  Referral coordinator reports that she did not receive the referral request noted in my plan  at last visit. Pt. Concerned that insurance had no one in network in the past.  Depression screen [facility] 2/9 09/17/2020 09/17/2020 08/06/2020  Decreased Interest 0 0 0  Down, Depressed, Hopeless 0 0 0  PHQ - 2 Score 0 0 0  Altered sleeping 1 - 3  Tired, decreased energy 1 - 0  Change in appetite 3 - 0  Feeling bad or failure about yourself  0 - 0  Trouble concentrating 0 - 1  Moving slowly or fidgety/restless 0 - 0  Suicidal thoughts 0 - 0  PHQ-9 Score 5 - 4  Difficult doing work/chores Somewhat difficult - Somewhat difficult    History Kelli Thompson has a past medical history of Anxiety, Arthritis, Depression, GERD (gastroesophageal reflux disease), Hypertension, Pancreatitis, Peri-menopause (02/28/2014), Urinary frequency (01/30/2014), and Urinary incontinence, mixed (01/30/2014).   She has a past surgical history that includes Cesarean section; Cholecystectomy; Appendectomy; Abdominal hysterectomy; Colonoscopy with propofol (N/A, 02/02/2020); and Breast biopsy (Right, 02/20/2020).   Her family history includes Alcohol abuse in her paternal grandfather; Arthritis in her maternal grandmother; Asthma in her father; Cancer in her paternal grandmother; Congestive Heart Failure in her maternal grandmother; Diabetes in her maternal grandmother and mother; Hypertension in her father and mother; Other in her son; Parkinson's disease in her maternal grandfather; Stroke in her father.She reports that she quit smoking about 7 years ago. Her smoking use included cigarettes. She has a 15.00 pack-year smoking history. She has never used  smokeless tobacco. She reports that she does not drink alcohol and does not use drugs.    ROS Review of Systems  Constitutional: Negative.   HENT: Negative.    Eyes:  Negative for visual disturbance.  Respiratory:  Negative for shortness of breath.   Cardiovascular:  Negative for chest pain.  Gastrointestinal:  Negative for abdominal pain.  Musculoskeletal:  Positive for arthralgias and neck pain.   Objective:  BP 130/82   Pulse 77   Temp (!) 97.1 F (36.2 C)   Ht '5\' 5"'$  (1.651 m)   Wt 241 lb (109.3 kg)   SpO2 96%   BMI 40.10 kg/m   BP Readings from Last 3 Encounters:  09/17/20 130/82  08/24/20 103/68  08/06/20 (!) 126/93    Wt Readings from Last 3 Encounters:  09/17/20 241 lb (109.3 kg)  08/24/20 233 lb (105.7 kg)  08/06/20 233 lb 3.2 oz (105.8 kg)     Physical Exam Constitutional:      General: She is not in acute distress.    Appearance: She is well-developed.  HENT:     Head: Normocephalic and atraumatic.  Eyes:     Conjunctiva/sclera: Conjunctivae normal.     Pupils: Pupils are equal, round, and reactive to light.  Cardiovascular:     Rate and Rhythm: Normal rate and regular rhythm.     Heart sounds: Normal heart sounds. No murmur heard. Pulmonary:     Effort: Pulmonary effort is normal. No respiratory distress.     Breath sounds: Normal breath sounds. No wheezing or rales.  Musculoskeletal:  General: Tenderness (right posterior neck and superior trapezius) present.     Cervical back: Tenderness present.     Lumbar back: Spasms and tenderness present. No deformity. Decreased range of motion.  Lymphadenopathy:     Cervical: No cervical adenopathy.  Skin:    General: Skin is warm and dry.  Neurological:     Mental Status: She is alert and oriented to person, place, and time.     Deep Tendon Reflexes: Reflexes are normal and symmetric.  Psychiatric:        Behavior: Behavior normal.        Thought Content: Thought content normal.       Assessment & Plan:   Kelli Thompson was seen today for follow-up.  Diagnoses and all orders for this visit:  Cervical myelopathy ([place]) -     Ambulatory referral to Neurosurgery  Cervical disc disease with myelopathy -     pregabalin (LYRICA) 200 MG capsule; Take 2 capsules (400 mg total) by mouth at bedtime.  Acute pain of left shoulder -     methocarbamol (ROBAXIN) 500 MG tablet; Take 1 tablet (500 mg total) by mouth 4 (four) times daily.      I have changed Kelli Thompson pregabalin. I am also having her maintain her amLODipine, carvedilol, DULoxetine, famotidine, furosemide, olmesartan, potassium chloride SA, ibuprofen, and methocarbamol.  Allergies as of 09/17/2020       Reactions   Ace Inhibitors    pancreatitis        Medication List        Accurate as of September 17, 2020  5:35 PM. If you have any questions, ask your nurse or doctor.          amLODipine 10 MG tablet Commonly known as: NORVASC TAKE ONE TABLET BY MOUTH DAILY FOR BLOOD PRESSURE   carvedilol 12.5 MG tablet Commonly known as: COREG 1 po BID   DULoxetine 60 MG capsule Commonly known as: CYMBALTA Take 1 capsule (60 mg total) by mouth daily with supper.   famotidine 20 MG tablet Commonly known as: PEPCID Take 1 tablet (20 mg total) by mouth 2 (two) times daily.   furosemide 40 MG tablet Commonly known as: LASIX Take 1 tablet (40 mg total) by mouth daily.   ibuprofen 600 MG tablet Commonly known as: ADVIL Take 1 tablet (600 mg total) by mouth every 8 (eight) hours as needed.   methocarbamol 500 MG tablet Commonly known as: Robaxin Take 1 tablet (500 mg total) by mouth 4 (four) times daily.   olmesartan 40 MG tablet Commonly known as: BENICAR Take 1 tablet (40 mg total) by mouth daily.   potassium chloride SA 20 MEQ tablet Commonly known as: Klor-Con M20 TAKE ONE TABLET BY MOUTH DAILY FOR POTASSIUM   pregabalin 200 MG capsule Commonly known as: Lyrica Take 2 capsules (400  mg total) by mouth at bedtime. What changed:  medication strength how much to take Changed by: Claretta Fraise, MD         Follow-up: Return if symptoms worsen or fail to improve.  Claretta Fraise, M.D.

## 2020-11-06 ENCOUNTER — Telehealth: Payer: Self-pay | Admitting: Family Medicine

## 2020-11-12 ENCOUNTER — Other Ambulatory Visit: Payer: Self-pay | Admitting: Family Medicine

## 2020-11-12 DIAGNOSIS — G959 Disease of spinal cord, unspecified: Secondary | ICD-10-CM

## 2020-11-12 NOTE — Telephone Encounter (Signed)
Referral placed, as requested [facility] 

## 2021-02-01 ENCOUNTER — Other Ambulatory Visit: Payer: Self-pay | Admitting: Family Medicine

## 2021-02-22 ENCOUNTER — Other Ambulatory Visit: Payer: Self-pay | Admitting: Family Medicine

## 2021-02-22 DIAGNOSIS — M5000 Cervical disc disorder with myelopathy, unspecified cervical region: Secondary | ICD-10-CM

## 2021-02-26 ENCOUNTER — Ambulatory Visit (INDEPENDENT_AMBULATORY_CARE_PROVIDER_SITE_OTHER): Payer: No Typology Code available for payment source | Admitting: Family Medicine

## 2021-02-26 ENCOUNTER — Encounter: Payer: Self-pay | Admitting: Family Medicine

## 2021-02-26 VITALS — BP 132/85 | HR 91 | Temp 97.2°F | Ht 65.0 in | Wt 241.8 lb

## 2021-02-26 DIAGNOSIS — I10 Essential (primary) hypertension: Secondary | ICD-10-CM | POA: Diagnosis not present

## 2021-02-26 DIAGNOSIS — E7800 Pure hypercholesterolemia, unspecified: Secondary | ICD-10-CM

## 2021-02-26 DIAGNOSIS — R609 Edema, unspecified: Secondary | ICD-10-CM | POA: Diagnosis not present

## 2021-02-26 DIAGNOSIS — M5000 Cervical disc disorder with myelopathy, unspecified cervical region: Secondary | ICD-10-CM | POA: Diagnosis not present

## 2021-02-26 LAB — LIPID PANEL

## 2021-02-26 MED ORDER — FAMOTIDINE 20 MG PO TABS: 20.0000 mg | ORAL_TABLET | Freq: Two times a day (BID) | ORAL | 3 refills | Status: DC

## 2021-02-26 MED ORDER — AMLODIPINE BESYLATE 5 MG PO TABS: 5.0000 mg | ORAL_TABLET | Freq: Every day | ORAL | 3 refills | Status: DC

## 2021-02-26 MED ORDER — PREGABALIN 200 MG PO CAPS: 400.0000 mg | ORAL_CAPSULE | Freq: Every day | ORAL | 1 refills | Status: DC

## 2021-02-26 MED ORDER — AMOXICILLIN-POT CLAVULANATE 875-125 MG PO TABS: 1.0000 | ORAL_TABLET | Freq: Two times a day (BID) | ORAL | 0 refills | Status: DC

## 2021-02-26 MED ORDER — BENZONATATE 200 MG PO CAPS: 200.0000 mg | ORAL_CAPSULE | Freq: Three times a day (TID) | ORAL | 0 refills | Status: DC | PRN

## 2021-02-26 NOTE — Progress Notes (Signed)
Subjective:  Patient ID: Kelli Thompson, female    DOB: 02-17-1965  Age: 56 y.o. MRN: [id number]  CC: Medical Management of Chronic Issues   HPI Kelli Thompson presents for back pain. Fluid in legs for 3 mos. Props feet in the evenings. In evening has "sausage toes." Cutting back on salt.   Planning back surgery on May 1 with [facility], unless earlier if they have a cancellation.   Symptoms include congestion, facial pain, nasal congestion, non productive cough, post nasal drip and sinus pressure. There is no fever, chills, or sweats. Onset of symptoms was a few days ago, gradually worsening since that time.    Depression screen [facility] 2/9 02/26/2021 09/17/2020 09/17/2020  Decreased Interest 0 0 0  Down, Depressed, Hopeless 0 0 0  PHQ - 2 Score 0 0 0  Altered sleeping - 1 -  Tired, decreased energy - 1 -  Change in appetite - 3 -  Feeling bad or failure about yourself  - 0 -  Trouble concentrating - 0 -  Moving slowly or fidgety/restless - 0 -  Suicidal thoughts - 0 -  PHQ-9 Score - 5 -  Difficult doing work/chores - Somewhat difficult -    History Kelli Thompson has a past medical history of Anxiety, Arthritis, Depression, GERD (gastroesophageal reflux disease), Hypertension, Pancreatitis, Peri-menopause (02/28/2014), Urinary frequency (01/30/2014), and Urinary incontinence, mixed (01/30/2014).   She has a past surgical history that includes Cesarean section; Cholecystectomy; Appendectomy; Abdominal hysterectomy; Colonoscopy with propofol (N/A, 02/02/2020); and Breast biopsy (Right, 02/20/2020).   Her family history includes Alcohol abuse in her paternal grandfather; Arthritis in her maternal grandmother; Asthma in her father; Cancer in her paternal grandmother; Congestive Heart Failure in her maternal grandmother; Diabetes in her maternal grandmother and mother; Hypertension in her father and mother; Other in her son; Parkinson's disease in her maternal grandfather; Stroke in her father.She reports  that she quit smoking about 7 years ago. Her smoking use included cigarettes. She has a 15.00 pack-year smoking history. She has never used smokeless tobacco. She reports that she does not drink alcohol and does not use drugs.    ROS Review of Systems  Constitutional: Negative.   HENT: Negative.    Eyes:  Negative for visual disturbance.  Respiratory:  Negative for shortness of breath.   Cardiovascular:  Negative for chest pain.  Gastrointestinal:  Negative for abdominal pain.  Musculoskeletal:  Negative for arthralgias.   Objective:  BP 132/85    Pulse 91    Temp (!) 97.2 F (36.2 C)    Ht 5' 5"  (1.651 m)    Wt 241 lb 12.8 oz (109.7 kg)    SpO2 95%    BMI 40.24 kg/m   BP Readings from Last 3 Encounters:  02/26/21 132/85  09/17/20 130/82  08/24/20 103/68    Wt Readings from Last 3 Encounters:  02/26/21 241 lb 12.8 oz (109.7 kg)  09/17/20 241 lb (109.3 kg)  08/24/20 233 lb (105.7 kg)     Physical Exam Constitutional:      General: She is not in acute distress.    Appearance: She is well-developed.  HENT:     Head: Normocephalic and atraumatic.     Right Ear: Tympanic membrane and external ear normal. No decreased hearing noted.     Left Ear: Tympanic membrane and external ear normal. No decreased hearing noted.     Nose: Mucosal edema present.     Right Sinus: No frontal sinus tenderness.  Left Sinus: No frontal sinus tenderness.     Mouth/Throat:     Pharynx: No oropharyngeal exudate or posterior oropharyngeal erythema.  Eyes:     Conjunctiva/sclera: Conjunctivae normal.     Pupils: Pupils are equal, round, and reactive to light.  Neck:     Meningeal: Brudzinski's sign absent.  Cardiovascular:     Rate and Rhythm: Normal rate and regular rhythm.     Heart sounds: Normal heart sounds. No murmur heard. Pulmonary:     Effort: Pulmonary effort is normal. No respiratory distress.     Breath sounds: Normal breath sounds. No wheezing or rales.  Abdominal:      General: Bowel sounds are normal. There is no distension.     Palpations: Abdomen is soft.     Tenderness: There is no abdominal tenderness.  Musculoskeletal:        General: Tenderness (posterior neck) present.     Cervical back: Normal range of motion and neck supple.     Lumbar back: Spasms and tenderness present. No deformity. Decreased range of motion.  Lymphadenopathy:     Head:     Right side of head: No preauricular adenopathy.     Left side of head: No preauricular adenopathy.     Cervical:     Right cervical: No superficial cervical adenopathy.    Left cervical: No superficial cervical adenopathy.  Skin:    General: Skin is warm and dry.  Neurological:     Mental Status: She is alert and oriented to person, place, and time.     Deep Tendon Reflexes: Reflexes are normal and symmetric.  Psychiatric:        Behavior: Behavior normal.        Thought Content: Thought content normal.      Assessment & Plan:   Kelli Thompson was seen today for medical management of chronic issues.  Diagnoses and all orders for this visit:  Elevated LDL cholesterol level -     Lipid panel  Essential hypertension -     CBC with Differential/Platelet -     CMP14+EGFR -     amLODipine (NORVASC) 5 MG tablet; Take 1 tablet (5 mg total) by mouth daily. TAKE ONE TABLET BY MOUTH DAILY FOR BLOOD PRESSURE  Cervical disc disease with myelopathy -     pregabalin (LYRICA) 200 MG capsule; Take 2 capsules (400 mg total) by mouth at bedtime.  Dependent edema -     Compression stockings  Other orders -     famotidine (PEPCID) 20 MG tablet; Take 1 tablet (20 mg total) by mouth 2 (two) times daily. (NEEDS TO BE SEEN BEFORE NEXT REFILL) -     amoxicillin-clavulanate (AUGMENTIN) 875-125 MG tablet; Take 1 tablet by mouth 2 (two) times daily. Take all of this medication -     benzonatate (TESSALON) 200 MG capsule; Take 1 capsule (200 mg total) by mouth 3 (three) times daily as needed for cough.       I  have discontinued Colton W. Arzola's ibuprofen. I have also changed her amLODipine. Additionally, I am having her start on amoxicillin-clavulanate and benzonatate. Lastly, I am having her maintain her carvedilol, DULoxetine, furosemide, olmesartan, potassium chloride SA, methocarbamol, famotidine, and pregabalin.  Allergies as of 02/26/2021       Reactions   Ace Inhibitors    pancreatitis   Other    Any of the -prils, manly lisinopril        Medication List  Accurate as of February 26, 2021  7:46 PM. If you have any questions, ask your nurse or doctor.          STOP taking these medications    ibuprofen 600 MG tablet Commonly known as: ADVIL Stopped by: Claretta Fraise, MD       TAKE these medications    amLODipine 5 MG tablet Commonly known as: NORVASC Take 1 tablet (5 mg total) by mouth daily. TAKE ONE TABLET BY MOUTH DAILY FOR BLOOD PRESSURE What changed:  medication strength how much to take how to take this when to take this Changed by: Claretta Fraise, MD   amoxicillin-clavulanate 417 [id number] [id number] MG tablet Commonly known as: AUGMENTIN Take 1 tablet by mouth 2 (two) times daily. Take all of this medication Started by: Claretta Fraise, MD   benzonatate 200 MG capsule Commonly known as: TESSALON Take 1 capsule (200 mg total) by mouth 3 (three) times daily as needed for cough. Started by: Claretta Fraise, MD   carvedilol 12.5 MG tablet Commonly known as: COREG 1 po BID   DULoxetine 60 MG capsule Commonly known as: CYMBALTA Take 1 capsule (60 mg total) by mouth daily with supper.   famotidine 20 MG tablet Commonly known as: PEPCID Take 1 tablet (20 mg total) by mouth 2 (two) times daily. (NEEDS TO BE SEEN BEFORE NEXT REFILL)   furosemide 40 MG tablet Commonly known as: LASIX Take 1 tablet (40 mg total) by mouth daily.   methocarbamol 500 MG tablet Commonly known as: Robaxin Take 1 tablet (500 mg total) by mouth 4 (four) times daily.   olmesartan 40 MG  tablet Commonly known as: BENICAR Take 1 tablet (40 mg total) by mouth daily.   potassium chloride SA 20 MEQ tablet Commonly known as: Klor-Con M20 TAKE ONE TABLET BY MOUTH DAILY FOR POTASSIUM   pregabalin 200 MG capsule Commonly known as: Lyrica Take 2 capsules (400 mg total) by mouth at bedtime.         Follow-up: Return in about 3 months (around 05/26/2021).  Claretta Fraise, M.D.

## 2021-02-27 LAB — CMP14+EGFR
ALT: 25 IU/L (ref 0–32)
AST: 15 IU/L (ref 0–40)
Albumin/Globulin Ratio: 1.7 (ref 1.2–2.2)
Albumin: 4.5 g/dL (ref 3.8–4.9)
Alkaline Phosphatase: 116 IU/L (ref 44–121)
BUN/Creatinine Ratio: 13 (ref 9–23)
BUN: 11 mg/dL (ref 6–24)
Bilirubin Total: 0.2 mg/dL (ref 0.0–1.2)
CO2: 24 mmol/L (ref 20–29)
Calcium: 9.6 mg/dL (ref 8.7–10.2)
Chloride: 104 mmol/L (ref 96–106)
Creatinine, Ser: 0.84 mg/dL (ref 0.57–1.00)
Globulin, Total: 2.6 g/dL (ref 1.5–4.5)
Glucose: 138 mg/dL — ABNORMAL HIGH (ref 70–99)
Potassium: 4.3 mmol/L (ref 3.5–5.2)
Sodium: 141 mmol/L (ref 134–144)
Total Protein: 7.1 g/dL (ref 6.0–8.5)
eGFR: 82 mL/min/{1.73_m2} (ref >59)

## 2021-02-27 LAB — CBC WITH DIFFERENTIAL/PLATELET
Basophils Absolute: 0.1 10*3/uL (ref 0.0–0.2)
Basos: 1 %
EOS (ABSOLUTE): 0.4 10*3/uL (ref 0.0–0.4)
Eos: 5 %
Hematocrit: 42.7 % (ref 34.0–46.6)
Hemoglobin: 14.2 g/dL (ref 11.1–15.9)
Immature Grans (Abs): 0 10*3/uL (ref 0.0–0.1)
Immature Granulocytes: 0 %
Lymphocytes Absolute: 1.9 10*3/uL (ref 0.7–3.1)
Lymphs: 25 %
MCH: 27.5 pg (ref 26.6–33.0)
MCHC: 33.3 g/dL (ref 31.5–35.7)
MCV: 83 fL (ref 79–97)
Monocytes Absolute: 0.5 10*3/uL (ref 0.1–0.9)
Monocytes: 6 %
Neutrophils Absolute: 4.9 10*3/uL (ref 1.4–7.0)
Neutrophils: 63 %
Platelets: 258 10*3/uL (ref 150–450)
RBC: 5.17 x10E6/uL (ref 3.77–5.28)
RDW: 14.6 % (ref 11.7–15.4)
WBC: 7.8 10*3/uL (ref 3.4–10.8)

## 2021-02-27 LAB — LIPID PANEL
Chol/HDL Ratio: 5.6 ratio — ABNORMAL HIGH (ref 0.0–4.4)
Cholesterol, Total: 162 mg/dL (ref 100–199)
HDL: 29 mg/dL — ABNORMAL LOW (ref >39)
LDL Chol Calc (NIH): 110 mg/dL — ABNORMAL HIGH (ref 0–99)
Triglycerides: 126 mg/dL (ref 0–149)
VLDL Cholesterol Cal: 23 mg/dL (ref 5–40)

## 2021-03-13 ENCOUNTER — Encounter: Payer: Self-pay | Admitting: *Deleted

## 2021-05-11 ENCOUNTER — Encounter (HOSPITAL_COMMUNITY): Payer: Self-pay

## 2021-05-11 ENCOUNTER — Other Ambulatory Visit: Payer: Self-pay

## 2021-05-11 ENCOUNTER — Emergency Department (HOSPITAL_COMMUNITY)
Admission: EM | Admit: 2021-05-11 | Discharge: 2021-05-11 | Disposition: A | Discharge status: Home / Self Care | Payer: PRIVATE HEALTH INSURANCE | Attending: Emergency Medicine | Admitting: Emergency Medicine

## 2021-05-11 DIAGNOSIS — M25512 Pain in left shoulder: Secondary | ICD-10-CM

## 2021-05-11 DIAGNOSIS — Z79899 Other long term (current) drug therapy: Secondary | ICD-10-CM | POA: Insufficient documentation

## 2021-05-11 DIAGNOSIS — I10 Essential (primary) hypertension: Secondary | ICD-10-CM | POA: Insufficient documentation

## 2021-05-11 DIAGNOSIS — R519 Headache, unspecified: Secondary | ICD-10-CM | POA: Insufficient documentation

## 2021-05-11 DIAGNOSIS — M542 Cervicalgia: Secondary | ICD-10-CM | POA: Diagnosis present

## 2021-05-11 MED ORDER — KETOROLAC TROMETHAMINE 30 MG/ML IJ SOLN
15.0000 mg | Freq: Once | INTRAMUSCULAR | Status: AC
  Administered 2021-05-11: 15 mg via INTRAVENOUS
  Filled 2021-05-11: qty 1

## 2021-05-11 MED ORDER — PROCHLORPERAZINE EDISYLATE 10 MG/2ML IJ SOLN
5.0000 mg | Freq: Once | INTRAMUSCULAR | Status: AC
  Administered 2021-05-11: 5 mg via INTRAVENOUS
  Filled 2021-05-11: qty 2

## 2021-05-11 MED ORDER — FENTANYL CITRATE PF 50 MCG/ML IJ SOSY
50.0000 ug | PREFILLED_SYRINGE | Freq: Once | INTRAMUSCULAR | Status: AC
  Administered 2021-05-11: 50 ug via INTRAVENOUS
  Filled 2021-05-11: qty 1

## 2021-05-11 MED ORDER — METHOCARBAMOL 500 MG PO TABS: 500.0000 mg | ORAL_TABLET | Freq: Three times a day (TID) | ORAL | 0 refills | Status: DC | PRN

## 2021-05-11 NOTE — ED Triage Notes (Signed)
Pt reports neck pain x 2 years, reports scheduled for surgery on May 1st, pt says she notified Dr Edwinna Areola, surgeon and he told her to come to ED if pain got worse. ?

## 2021-05-11 NOTE — ED Provider Notes (Signed)
?[facility] ?Provider Note ? ? ?CSN: [id number] ?Arrival date & time: 05/11/21  1908 ? ?  ? ?History ? ?Chief Complaint  ?Patient presents with  ? Neck Pain  ? ? ?Kelli Thompson is a 56 y.o. female. ? ? ?Neck Pain ?Associated symptoms: headaches   ?Pain in the upper neck going behind her ear into her head.  Has a history of some chronic neck pain over the last couple years due to spinal stenosis and is due for surgery in around 2 weeks.  However does have headaches at times.  No worsening of the numbness or weakness in the arms.  No new injury.  Not on blood thinners.  Not on muscle laxer's at home.  No pain in the ear.  Patient is having spasms in the upper neck and sometimes worse with movement.  No vision changes. ?  ? ?Home Medications ?Prior to Admission medications   ?Medication Sig Start Date End Date Taking? Authorizing Provider  ?amLODipine (NORVASC) 5 MG tablet Take 1 tablet (5 mg total) by mouth daily. TAKE ONE TABLET BY MOUTH DAILY FOR BLOOD PRESSURE 02/26/21   Claretta Fraise, MD  ?amoxicillin-clavulanate (AUGMENTIN) 875-125 MG tablet Take 1 tablet by mouth 2 (two) times daily. Take all of this medication 02/26/21   Claretta Fraise, MD  ?benzonatate (TESSALON) 200 MG capsule Take 1 capsule (200 mg total) by mouth 3 (three) times daily as needed for cough. 02/26/21   Claretta Fraise, MD  ?carvedilol (COREG) 12.5 MG tablet 1 po BID 08/06/20   Claretta Fraise, MD  ?DULoxetine (CYMBALTA) 60 MG capsule Take 1 capsule (60 mg total) by mouth daily with supper. 08/06/20   Claretta Fraise, MD  ?famotidine (PEPCID) 20 MG tablet Take 1 tablet (20 mg total) by mouth 2 (two) times daily. (NEEDS TO BE SEEN BEFORE NEXT REFILL) 02/26/21   Claretta Fraise, MD  ?furosemide (LASIX) 40 MG tablet Take 1 tablet (40 mg total) by mouth daily. 08/06/20   Claretta Fraise, MD  ?methocarbamol (ROBAXIN) 500 MG tablet Take 1 tablet (500 mg total) by mouth 4 (four) times daily. 09/17/20   Claretta Fraise, MD  ?olmesartan  (BENICAR) 40 MG tablet Take 1 tablet (40 mg total) by mouth daily. 08/06/20   Claretta Fraise, MD  ?potassium chloride SA (KLOR-CON M20) 20 MEQ tablet TAKE ONE TABLET BY MOUTH DAILY FOR POTASSIUM 08/06/20   Claretta Fraise, MD  ?pregabalin (LYRICA) 200 MG capsule Take 2 capsules (400 mg total) by mouth at bedtime. 02/26/21   Claretta Fraise, MD  ?   ? ?Allergies    ?Ace inhibitors and Other   ? ?Review of Systems   ?Review of Systems  ?Constitutional:  Negative for appetite change.  ?Eyes:  Negative for visual disturbance.  ?Musculoskeletal:  Positive for neck pain.  ?Neurological:  Positive for headaches.  ? ?Physical Exam ?Updated Vital Signs ?BP (!) 177/101 (BP Location: Right Arm) Comment: pt says she hasn't taken evening dose of BP med  Pulse 84   Temp 97.8 ?F (36.6 ?C) (Oral)   Resp 18   Ht '5\' 5"'$  (1.651 m)   Wt 111.1 kg   SpO2 98%   BMI 40.77 kg/m?  ?Physical Exam ?Vitals and nursing note reviewed.  ?HENT:  ?   Head: Atraumatic.  ?Eyes:  ?   Extraocular Movements: Extraocular movements intact.  ?   Pupils: Pupils are equal, round, and reactive to light.  ?Neck:  ?   Comments: Some tenderness over upper neck musculature.  No deformity.  No rash. ?Cardiovascular:  ?   Rate and Rhythm: Normal rate.  ?Musculoskeletal:  ?   Cervical back: Neck supple.  ?Skin: ?   General: Skin is warm.  ?   Capillary Refill: Capillary refill takes less than 2 seconds.  ?Neurological:  ?   Mental Status: She is oriented to person, place, and time.  ? ? ?ED Results / Procedures / Treatments   ?Labs ?(all labs ordered are listed, but only abnormal results are displayed) ?Labs Reviewed - No data to display ? ?EKG ?None ? ?Radiology ?No results found. ? ?Procedures ?Procedures  ? ? ?Medications Ordered in ED ?Medications  ?ketorolac (TORADOL) 30 MG/ML injection 15 mg (15 mg Intravenous Given 05/11/21 2034)  ?prochlorperazine (COMPAZINE) injection 5 mg (5 mg Intravenous Given 05/11/21 2034)  ?fentaNYL (SUBLIMAZE) injection 50 mcg (50 mcg  Intravenous Given 05/11/21 2033)  ? ? ?ED Course/ Medical Decision Making/ A&P ?  ?                        ?Medical Decision Making ?Risk ?Prescription drug management. ? ? ?Patient with right-sided neck pain and headache.  History of chronic neck pain and sometimes getting headaches.  Nonfocal exam.  Do not feels we need imaging at this time.  Doubt severe intracranial hemorrhage.  Doubt meningitis.  Doubt severely worsening of the known cervical spine disease.  Treated with migraine cocktail of Compazine some fentanyl and Toradol.  Feeling better.  Had been sleeping in the ER after it.  Will discharge home with some muscle relaxer.  Outpatient follow-up as needed with both PCP and her neurosurgeon.  Due for surgery in around 2 weeks.  I have reviewed previous neurosurgery note. ? ? ? ? ? ? ? ?Final Clinical Impression(s) / ED Diagnoses ?Final diagnoses:  ?None  ? ? ?Rx / DC Orders ?ED Discharge Orders   ? ? None  ? ?  ? ? ?  ?Davonna Belling, MD ?05/11/21 2234 ? ?

## 2021-07-05 ENCOUNTER — Emergency Department (HOSPITAL_BASED_OUTPATIENT_CLINIC_OR_DEPARTMENT_OTHER)
Admission: EM | Admit: 2021-07-05 | Discharge: 2021-07-06 | Disposition: A | Discharge status: Home / Self Care | Payer: PRIVATE HEALTH INSURANCE | Attending: Emergency Medicine | Admitting: Emergency Medicine

## 2021-07-05 ENCOUNTER — Other Ambulatory Visit: Payer: Self-pay

## 2021-07-05 ENCOUNTER — Emergency Department (HOSPITAL_BASED_OUTPATIENT_CLINIC_OR_DEPARTMENT_OTHER): Payer: PRIVATE HEALTH INSURANCE | Admitting: Radiology

## 2021-07-05 ENCOUNTER — Encounter (HOSPITAL_BASED_OUTPATIENT_CLINIC_OR_DEPARTMENT_OTHER): Payer: Self-pay

## 2021-07-05 DIAGNOSIS — Z87891 Personal history of nicotine dependence: Secondary | ICD-10-CM | POA: Insufficient documentation

## 2021-07-05 DIAGNOSIS — X501XXA Overexertion from prolonged static or awkward postures, initial encounter: Secondary | ICD-10-CM | POA: Insufficient documentation

## 2021-07-05 DIAGNOSIS — S8992XA Unspecified injury of left lower leg, initial encounter: Secondary | ICD-10-CM | POA: Insufficient documentation

## 2021-07-05 DIAGNOSIS — Z79899 Other long term (current) drug therapy: Secondary | ICD-10-CM | POA: Insufficient documentation

## 2021-07-05 DIAGNOSIS — I10 Essential (primary) hypertension: Secondary | ICD-10-CM | POA: Insufficient documentation

## 2021-07-05 NOTE — ED Triage Notes (Signed)
"  Hurt my left knee about 6 weeks ago getting in bed, and it is not getting better. Have not been seen for it yet, and it is swollen and painful" per pt

## 2021-07-06 MED ORDER — MELOXICAM 15 MG PO TABS: ORAL_TABLET | ORAL | 0 refills | Status: DC

## 2021-07-06 MED ORDER — NAPROXEN 250 MG PO TABS
500.0000 mg | ORAL_TABLET | Freq: Once | ORAL | Status: AC
  Administered 2021-07-06: 500 mg via ORAL
  Filled 2021-07-06: qty 2

## 2021-07-06 NOTE — ED Notes (Signed)
Discharge instructions including prescription and follow up care discussed with pt. Pt verbalize understanding with no questions at this time. Pt to follow up with ortho. Pt going home with s/o at bedside.

## 2021-07-06 NOTE — ED Provider Notes (Signed)
[facility] Provider Note: Georgena Spurling, MD, FACEP  CSN: [id number] MRN: [id number] ARRIVAL: 07/05/21 at [place]: [place]  Knee Pain   HISTORY OF PRESENT ILLNESS  07/06/21 12:26 AM Kelli Thompson is a 56 y.o. female who injured her left knee about 6 weeks ago getting into bed.  She thinks she twisted it wrong and felt a pop.  She continues to have pain in the left knee which has been persistent.  There is associated swelling as well.  She has not been seen for this previously.  She rates her pain as a 6 out of 10, worse with movement or weightbearing.    Past Medical History:  Diagnosis Date   Anxiety    Arthritis    Depression    GERD (gastroesophageal reflux disease)    Hypertension    Pancreatitis    around 2013; x1 secondary to lisinopril per patient   Peri-menopause 02/28/2014   Urinary frequency 01/30/2014   Urinary incontinence, mixed 01/30/2014    Past Surgical History:  Procedure Laterality Date   ABDOMINAL HYSTERECTOMY     APPENDECTOMY     BREAST BIOPSY Right 02/20/2020   Procedure: BREAST EXCISIONAL BIOPSY AFTER SEED LOCATION;  Surgeon: Virl Cagey, MD;  Location: [facility];  Service: General;  Laterality: Right;   CESAREAN SECTION     x2   CHOLECYSTECTOMY     COLONOSCOPY WITH PROPOFOL N/A 02/02/2020   Procedure: COLONOSCOPY WITH PROPOFOL;  Surgeon: Eloise Harman, DO;  Location: [facility];  Service: Endoscopy;  Laterality: N/A;  8:15am    Family History  Problem Relation Age of Onset   Hypertension Mother    Diabetes Mother        borderline   Hypertension Father    Asthma Father    Stroke Father    Diabetes Maternal Grandmother    Congestive Heart Failure Maternal Grandmother    Arthritis Maternal Grandmother    Parkinson's disease Maternal Grandfather    Cancer Paternal Grandmother    Alcohol abuse Paternal Grandfather    Other Son        stomach issues   Colon cancer Neg Hx     Social History   Tobacco  Use   Smoking status: Former    Packs/day: 0.50    Years: 30.00    Total pack years: 15.00    Types: Cigarettes    Quit date: 03/27/2013    Years since quitting: 8.2   Smokeless tobacco: Never  Vaping Use   Vaping Use: Never used  Substance Use Topics   Alcohol use: No   Drug use: No    Prior to Admission medications   Medication Sig Start Date End Date Taking? Authorizing Provider  amLODipine (NORVASC) 5 MG tablet Take 1 tablet (5 mg total) by mouth daily. TAKE ONE TABLET BY MOUTH DAILY FOR BLOOD PRESSURE 02/26/21   Claretta Fraise, MD  amoxicillin-clavulanate (AUGMENTIN) 875-125 MG tablet Take 1 tablet by mouth 2 (two) times daily. Take all of this medication 02/26/21   Claretta Fraise, MD  benzonatate (TESSALON) 200 MG capsule Take 1 capsule (200 mg total) by mouth 3 (three) times daily as needed for cough. 02/26/21   Claretta Fraise, MD  carvedilol (COREG) 12.5 MG tablet 1 po BID 08/06/20   Claretta Fraise, MD  DULoxetine (CYMBALTA) 60 MG capsule Take 1 capsule (60 mg total) by mouth daily with supper. 08/06/20   Claretta Fraise, MD  famotidine (PEPCID) 20 MG  tablet Take 1 tablet (20 mg total) by mouth 2 (two) times daily. (NEEDS TO BE SEEN BEFORE NEXT REFILL) 02/26/21   Claretta Fraise, MD  furosemide (LASIX) 40 MG tablet Take 1 tablet (40 mg total) by mouth daily. 08/06/20   Claretta Fraise, MD  methocarbamol (ROBAXIN) 500 MG tablet Take 1 tablet (500 mg total) by mouth every 8 (eight) hours as needed for muscle spasms. 05/11/21   Davonna Belling, MD  olmesartan (BENICAR) 40 MG tablet Take 1 tablet (40 mg total) by mouth daily. 08/06/20   Claretta Fraise, MD  potassium chloride SA (KLOR-CON M20) 20 MEQ tablet TAKE ONE TABLET BY MOUTH DAILY FOR POTASSIUM 08/06/20   Claretta Fraise, MD  pregabalin (LYRICA) 200 MG capsule Take 2 capsules (400 mg total) by mouth at bedtime. 02/26/21   Claretta Fraise, MD    Allergies Ace inhibitors and Other   REVIEW OF SYSTEMS  Negative except as noted here or in  the History of Present Illness.   PHYSICAL EXAMINATION  Initial Vital Signs Blood pressure (!) 169/106, pulse 79, temperature 98.9 F (37.2 C), resp. rate 18, height '5\' 5"'$  (1.651 m), weight 111.1 kg, SpO2 99 %.  Examination General: Well-developed, well-nourished female in no acute distress; appearance consistent with age of record HENT: normocephalic; atraumatic Eyes: Normal appearance Neck: supple Heart: regular rate and rhythm Lungs: clear to auscultation bilaterally Abdomen: soft; nondistended; nontender; bowel sounds present Extremities: No deformity; suprapatellar tenderness of the left knee with pain on passive movement Neurologic: Awake, alert and oriented; motor function intact in all extremities and symmetric; no facial droop Skin: Warm and dry Psychiatric: Normal mood and affect   RESULTS  Summary of this visit's results, reviewed and interpreted by myself:   EKG Interpretation  Date/Time:    Ventricular Rate:    PR Interval:    QRS Duration:   QT Interval:    QTC Calculation:   R Axis:     Text Interpretation:         Laboratory Studies: No results found for this or any previous visit (from the past 24 hour(s)). Imaging Studies: DG Knee Complete 4 Views Left  Result Date: 07/05/2021 CLINICAL DATA:  Heart knee 6 weeks ago.  Swollen and painful EXAM: LEFT KNEE - COMPLETE 4+ VIEW COMPARISON:  None Available. FINDINGS: No fracture of the proximal tibia or distal femur. Patella is normal. Small suprapatellar joint effusion. IMPRESSION: 1. Suprapatellar joint effusion. 2. No acute osseous findings Electronically Signed   By: Suzy Bouchard M.D.   On: 07/05/2021 21:12    ED COURSE and MDM  Nursing notes, initial and subsequent vitals signs, including pulse oximetry, reviewed and interpreted by myself.  Vitals:   07/05/21 2014 07/05/21 2015  BP:  (!) 169/106  Pulse:  79  Resp:  18  Temp:  98.9 F (37.2 C)  SpO2:  99%  Weight: 111.1 kg   Height: '5\' 5"'$   (1.651 m)    Medications - No data to display  We will place patient in a knee immobilizer and refer to orthopedics.  She was advised she may need an MRI for definitive diagnosis of her problem.  PROCEDURES  Procedures   ED DIAGNOSES     ICD-10-CM   1. Left knee injury, initial encounter  S89.92XA          Saloni Lablanc, Jenny Reichmann, MD 07/06/21 [id number]

## 2021-08-10 ENCOUNTER — Other Ambulatory Visit: Payer: Self-pay | Admitting: Family Medicine

## 2021-08-10 DIAGNOSIS — R600 Localized edema: Secondary | ICD-10-CM

## 2021-08-10 DIAGNOSIS — E876 Hypokalemia: Secondary | ICD-10-CM

## 2021-08-15 ENCOUNTER — Ambulatory Visit: Payer: Self-pay | Admitting: Family Medicine

## 2021-08-16 ENCOUNTER — Other Ambulatory Visit: Payer: Self-pay | Admitting: Family Medicine

## 2021-08-16 DIAGNOSIS — I10 Essential (primary) hypertension: Secondary | ICD-10-CM

## 2021-08-21 NOTE — Patient Instructions (Signed)
Our records indicate that you are due for your annual mammogram/breast imaging. While there is no way to prevent breast cancer, early detection provides the best opportunity for curing it. For women over the age of 40, the American Cancer Society recommends a yearly clinical breast exam and a yearly mammogram. These practices have saved thousands of lives. We need your help to ensure that you are receiving optimal medical care. Please call the imaging location that has done you previous mammograms. Please remember to list us as your primary care. This helps make sure we receive a report and can update your chart.  Below is the contact information for several local breast imaging centers. You may call the location that works best for you, and they will be happy to assistance in making you an appointment. You do not need an order for a regular screening mammogram. However, if you are having any problems or concerns with you breast area, please let your primary care provider know, and appropriate orders will be placed. Please let our office know if you have any questions or concerns. Or if you need information for another imaging center not on this list or outside of the area. We are commented to working with you on your health care journey.   The mobile unit/bus ([facility]) - they come twice a month to our location.  These appointments can be made through our office or by call The Breast Center  [facility]  [address] [place], [place] [address] Phone [phone]  [facility] Radiology Department  [address]  [place], [place] [address] [phone]  [facility]  [address] [place], [place] [address] [phone]  [facility] [place]  [address] [place] [address] [phone]  [facility]  [address], [address] [place] [address] [phone]  [facility] in   [address] , [place] [address] [phone]  [facility] [address] [place], [place] [address] [phone]  [facility] [address]  [address] [place], [place] [address] [phone]  [facility] [address] [place] [address] [phone]     

## 2021-08-22 ENCOUNTER — Encounter: Payer: Self-pay | Admitting: Family Medicine

## 2021-08-22 ENCOUNTER — Ambulatory Visit (INDEPENDENT_AMBULATORY_CARE_PROVIDER_SITE_OTHER): Payer: No Typology Code available for payment source | Admitting: Family Medicine

## 2021-08-22 VITALS — BP 133/86 | HR 76 | Temp 97.8°F | Ht 65.0 in | Wt 248.4 lb

## 2021-08-22 DIAGNOSIS — M5000 Cervical disc disorder with myelopathy, unspecified cervical region: Secondary | ICD-10-CM

## 2021-08-22 DIAGNOSIS — S83422A Sprain of lateral collateral ligament of left knee, initial encounter: Secondary | ICD-10-CM | POA: Diagnosis not present

## 2021-08-22 DIAGNOSIS — I10 Essential (primary) hypertension: Secondary | ICD-10-CM

## 2021-08-22 MED ORDER — MELOXICAM 15 MG PO TABS: ORAL_TABLET | ORAL | 5 refills | Status: DC

## 2021-08-22 MED ORDER — PREDNISONE 20 MG PO TABS: ORAL_TABLET | ORAL | 0 refills | Status: DC

## 2021-08-22 MED ORDER — PREGABALIN 200 MG PO CAPS: 400.0000 mg | ORAL_CAPSULE | Freq: Every day | ORAL | 1 refills | Status: DC

## 2021-08-22 NOTE — Progress Notes (Signed)
3.0  Subjective:  Patient ID: Kelli Thompson, female    DOB: 15-Feb-1965  Age: 56 y.o. MRN: [id number]  CC: Medical Management of Chronic Issues   HPI Kelli Thompson presents for  follow-up of hypertension. Patient has no history of headache chest pain or shortness of breath or recent cough. Patient also denies symptoms of TIA such as focal numbness or weakness. Patient denies side effects from medication. States taking it regularly.  In April hurt left knee. Felt a pop as she climbed into bed. Felt intense pain. Still working, knee pain waxes and wanes. Pain is lateral at left knee. Feels swollen. Pain is terrible. Went to E.D. given knee immobilizer. Couldn't use it and work so she DCed it.   NEck doing wonderfully well since surgery.      08/22/2021    8:00 AM 02/26/2021    8:16 AM 09/17/2020   10:18 AM 09/17/2020   10:04 AM 08/06/2020    8:06 AM  Depression screen PHQ 2/9  Decreased Interest 0 0 0 0 0  Down, Depressed, Hopeless 0 0 0 0 0  PHQ - 2 Score 0 0 0 0 0  Altered sleeping   1  3  Tired, decreased energy   1  0  Change in appetite   3  0  Feeling bad or failure about yourself    0  0  Trouble concentrating   0  1  Moving slowly or fidgety/restless   0  0  Suicidal thoughts   0  0  PHQ-9 Score   5  4  Difficult doing work/chores   Somewhat difficult  Somewhat difficult      History Kelli Thompson has a past medical history of Anxiety, Arthritis, Depression, GERD (gastroesophageal reflux disease), Hypertension, Pancreatitis, Peri-menopause (02/28/2014), Urinary frequency (01/30/2014), and Urinary incontinence, mixed (01/30/2014).   She has a past surgical history that includes Cesarean section; Cholecystectomy; Appendectomy; Abdominal hysterectomy; Colonoscopy with propofol (N/A, 02/02/2020); Breast biopsy (Right, 02/20/2020); and cervical discectomy and fusion.   Her family history includes Alcohol abuse in her paternal grandfather; Arthritis in her maternal grandmother; Asthma in  her father; Cancer in her paternal grandmother; Congestive Heart Failure in her maternal grandmother; Diabetes in her maternal grandmother and mother; Hypertension in her father and mother; Other in her son; Parkinson's disease in her maternal grandfather; Stroke in her father.She reports that she quit smoking about 8 years ago. Her smoking use included cigarettes. She has a 15.00 pack-year smoking history. She has never used smokeless tobacco. She reports that she does not drink alcohol and does not use drugs.  Current Outpatient Medications on File Prior to Visit  Medication Sig Dispense Refill   amLODipine (NORVASC) 5 MG tablet Take 1 tablet (5 mg total) by mouth daily. TAKE ONE TABLET BY MOUTH DAILY FOR BLOOD PRESSURE 90 tablet 3   benzonatate (TESSALON) 200 MG capsule Take 1 capsule (200 mg total) by mouth 3 (three) times daily as needed for cough. 20 capsule 0   carvedilol (COREG) 12.5 MG tablet 1 po BID 180 tablet 3   DULoxetine (CYMBALTA) 60 MG capsule Take 1 capsule (60 mg total) by mouth daily with supper. 90 capsule 3   famotidine (PEPCID) 20 MG tablet Take 1 tablet (20 mg total) by mouth 2 (two) times daily. (NEEDS TO BE SEEN BEFORE NEXT REFILL) 180 tablet 3   furosemide (LASIX) 40 MG tablet TAKE ONE TABLET BY MOUTH DAILY 90 tablet 3   methocarbamol (ROBAXIN) 500 MG  tablet Take 1 tablet (500 mg total) by mouth every 8 (eight) hours as needed for muscle spasms. 10 tablet 0   olmesartan (BENICAR) 40 MG tablet Take 1 tablet (40 mg total) by mouth daily. (NEEDS TO BE SEEN BEFORE NEXT REFILL) 30 tablet 0   potassium chloride SA (KLOR-CON M20) 20 MEQ tablet TAKE ONE TABLET BY MOUTH DAILY FOR POTASSIUM 90 tablet 3   No current facility-administered medications on file prior to visit.    ROS Review of Systems  Constitutional: Negative.   HENT: Negative.    Eyes:  Negative for visual disturbance.  Respiratory:  Negative for shortness of breath.   Cardiovascular:  Negative for chest pain.   Gastrointestinal:  Negative for abdominal pain.  Musculoskeletal:  Positive for arthralgias (left knee pain laterally).    Objective:  BP 133/86   Pulse 76   Temp 97.8 F (36.6 C)   Ht '5\' 5"'$  (1.651 m)   Wt 248 lb 6.4 oz (112.7 kg)   SpO2 96%   BMI 41.34 kg/m   BP Readings from Last 3 Encounters:  08/22/21 133/86  07/06/21 (!) 149/86  05/11/21 (!) 167/101    Wt Readings from Last 3 Encounters:  08/22/21 248 lb 6.4 oz (112.7 kg)  07/05/21 245 lb (111.1 kg)  05/11/21 245 lb (111.1 kg)     Physical Exam Constitutional:      General: She is not in acute distress.    Appearance: She is well-developed.  Cardiovascular:     Rate and Rhythm: Normal rate and regular rhythm.  Pulmonary:     Breath sounds: Normal breath sounds.  Musculoskeletal:        General: Swelling and tenderness (left knee lateral collateral) present. Normal range of motion.  Skin:    General: Skin is warm and dry.  Neurological:     Mental Status: She is alert and oriented to person, place, and time.       Assessment & Plan:   Kelli Thompson was seen today for medical management of chronic issues.  Diagnoses and all orders for this visit:  Essential hypertension  Cervical disc disease with myelopathy -     pregabalin (LYRICA) 200 MG capsule; Take 2 capsules (400 mg total) by mouth at bedtime.  Sprain of lateral collateral ligament of left knee, initial encounter  Other orders -     meloxicam (MOBIC) 15 MG tablet; Take 1 tablet daily as needed for knee pain. -     predniSONE (DELTASONE) 20 MG tablet; One twice daily with food for 2 weeks. Then one daily for 2 weeks   Allergies as of 08/22/2021       Reactions   Ace Inhibitors    pancreatitis   Other    Any of the -prils, manly lisinopril        Medication List        Accurate as of August 22, 2021  4:45 PM. If you have any questions, ask your nurse or doctor.          amLODipine 5 MG tablet Commonly known as: NORVASC Take 1  tablet (5 mg total) by mouth daily. TAKE ONE TABLET BY MOUTH DAILY FOR BLOOD PRESSURE   benzonatate 200 MG capsule Commonly known as: TESSALON Take 1 capsule (200 mg total) by mouth 3 (three) times daily as needed for cough.   carvedilol 12.5 MG tablet Commonly known as: COREG 1 po BID   DULoxetine 60 MG capsule Commonly known as: CYMBALTA Take 1 capsule (  60 mg total) by mouth daily with supper.   famotidine 20 MG tablet Commonly known as: PEPCID Take 1 tablet (20 mg total) by mouth 2 (two) times daily. (NEEDS TO BE SEEN BEFORE NEXT REFILL)   furosemide 40 MG tablet Commonly known as: LASIX TAKE ONE TABLET BY MOUTH DAILY   meloxicam 15 MG tablet Commonly known as: Mobic Take 1 tablet daily as needed for knee pain.   methocarbamol 500 MG tablet Commonly known as: Robaxin Take 1 tablet (500 mg total) by mouth every 8 (eight) hours as needed for muscle spasms.   olmesartan 40 MG tablet Commonly known as: BENICAR Take 1 tablet (40 mg total) by mouth daily. (NEEDS TO BE SEEN BEFORE NEXT REFILL)   potassium chloride SA 20 MEQ tablet Commonly known as: Klor-Con M20 TAKE ONE TABLET BY MOUTH DAILY FOR POTASSIUM   predniSONE 20 MG tablet Commonly known as: DELTASONE One twice daily with food for 2 weeks. Then one daily for 2 weeks Started by: Claretta Fraise, MD   pregabalin 200 MG capsule Commonly known as: Lyrica Take 2 capsules (400 mg total) by mouth at bedtime.        Meds ordered this encounter  Medications   meloxicam (MOBIC) 15 MG tablet    Sig: Take 1 tablet daily as needed for knee pain.    Dispense:  30 tablet    Refill:  5   pregabalin (LYRICA) 200 MG capsule    Sig: Take 2 capsules (400 mg total) by mouth at bedtime.    Dispense:  180 capsule    Refill:  1   predniSONE (DELTASONE) 20 MG tablet    Sig: One twice daily with food for 2 weeks. Then one daily for 2 weeks    Dispense:  42 tablet    Refill:  0      Follow-up: Return in about 6 months  (around 02/22/2022).  Claretta Fraise, M.D.

## 2021-09-04 ENCOUNTER — Encounter (INDEPENDENT_AMBULATORY_CARE_PROVIDER_SITE_OTHER): Payer: Self-pay

## 2021-09-13 ENCOUNTER — Other Ambulatory Visit: Payer: Self-pay | Admitting: Family Medicine

## 2021-09-13 DIAGNOSIS — M5000 Cervical disc disorder with myelopathy, unspecified cervical region: Secondary | ICD-10-CM

## 2021-09-14 ENCOUNTER — Other Ambulatory Visit: Payer: Self-pay | Admitting: Family Medicine

## 2021-09-14 DIAGNOSIS — I10 Essential (primary) hypertension: Secondary | ICD-10-CM

## 2021-09-28 ENCOUNTER — Other Ambulatory Visit: Payer: Self-pay | Admitting: Family Medicine

## 2021-09-28 DIAGNOSIS — I10 Essential (primary) hypertension: Secondary | ICD-10-CM

## 2021-09-29 IMAGING — MG MM BREAST SURGICAL SPECIMEN
1 series · 1 of 1 positions shown · non-contrast
Comparison: Previous exam(s).

CLINICAL DATA: Abnormal mammogram, for excision a biopsy

EXAM:
SPECIMEN RADIOGRAPH OF THE RIGHT BREAST

[R SPECIMEN]
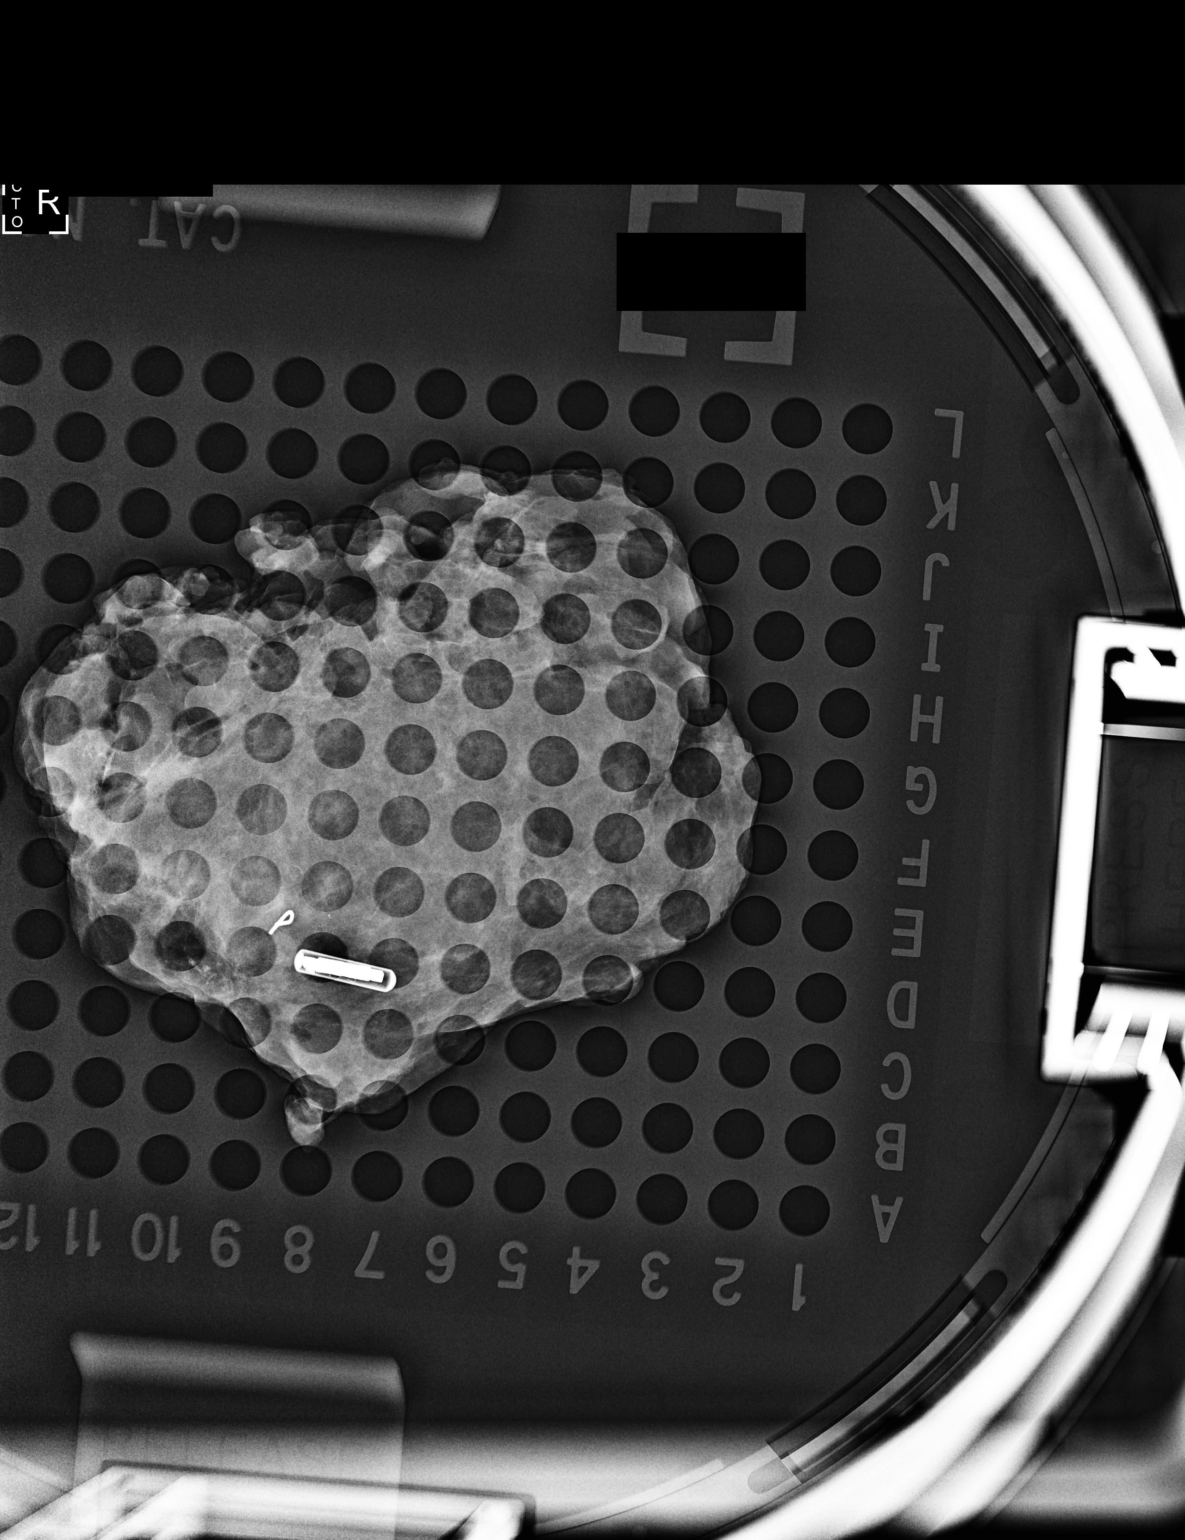

[1 of 1 positions shown; findings below may reference images not displayed]

FINDINGS: Status post excision of the RIGHT breast. The radiofrequency seed
and biopsy marker clip are present, completely intact, and were
marked for pathology.
IMPRESSION: Specimen radiograph of the RIGHT breast.

## 2021-10-25 ENCOUNTER — Other Ambulatory Visit: Payer: Self-pay | Admitting: Family Medicine

## 2021-10-25 DIAGNOSIS — F32A Depression, unspecified: Secondary | ICD-10-CM

## 2021-12-27 ENCOUNTER — Other Ambulatory Visit: Payer: Self-pay | Admitting: Family Medicine

## 2021-12-27 DIAGNOSIS — I10 Essential (primary) hypertension: Secondary | ICD-10-CM

## 2022-01-10 ENCOUNTER — Other Ambulatory Visit: Payer: Self-pay

## 2022-01-10 ENCOUNTER — Emergency Department (HOSPITAL_BASED_OUTPATIENT_CLINIC_OR_DEPARTMENT_OTHER)
Admission: EM | Admit: 2022-01-10 | Discharge: 2022-01-10 | Disposition: A | Discharge status: Home / Self Care | Payer: Self-pay | Attending: Emergency Medicine | Admitting: Emergency Medicine

## 2022-01-10 ENCOUNTER — Encounter (HOSPITAL_BASED_OUTPATIENT_CLINIC_OR_DEPARTMENT_OTHER): Payer: Self-pay | Admitting: Emergency Medicine

## 2022-01-10 DIAGNOSIS — R0981 Nasal congestion: Secondary | ICD-10-CM | POA: Insufficient documentation

## 2022-01-10 DIAGNOSIS — Z79899 Other long term (current) drug therapy: Secondary | ICD-10-CM | POA: Insufficient documentation

## 2022-01-10 DIAGNOSIS — I10 Essential (primary) hypertension: Secondary | ICD-10-CM | POA: Insufficient documentation

## 2022-01-10 DIAGNOSIS — J029 Acute pharyngitis, unspecified: Secondary | ICD-10-CM | POA: Insufficient documentation

## 2022-01-10 DIAGNOSIS — H60502 Unspecified acute noninfective otitis externa, left ear: Secondary | ICD-10-CM

## 2022-01-10 DIAGNOSIS — R0989 Other specified symptoms and signs involving the circulatory and respiratory systems: Secondary | ICD-10-CM | POA: Insufficient documentation

## 2022-01-10 DIAGNOSIS — H6092 Unspecified otitis externa, left ear: Secondary | ICD-10-CM | POA: Insufficient documentation

## 2022-01-10 DIAGNOSIS — Z1152 Encounter for screening for COVID-19: Secondary | ICD-10-CM | POA: Insufficient documentation

## 2022-01-10 DIAGNOSIS — J111 Influenza due to unidentified influenza virus with other respiratory manifestations: Secondary | ICD-10-CM

## 2022-01-10 DIAGNOSIS — R059 Cough, unspecified: Secondary | ICD-10-CM | POA: Insufficient documentation

## 2022-01-10 LAB — RESP PANEL BY RT-PCR (RSV, FLU A&B, COVID)  RVPGX2
Influenza A by PCR: NEGATIVE
Influenza B by PCR: NEGATIVE
Resp Syncytial Virus by PCR: NEGATIVE
SARS Coronavirus 2 by RT PCR: NEGATIVE

## 2022-01-10 MED ORDER — FLUTICASONE PROPIONATE 50 MCG/ACT NA SUSP: 1.0000 | Freq: Every day | NASAL | 0 refills | Status: DC

## 2022-01-10 MED ORDER — CIPRO HC 0.2-1 % OT SUSP: 3.0000 [drp] | Freq: Two times a day (BID) | OTIC | 0 refills | Status: DC

## 2022-01-10 MED ORDER — OFLOXACIN 0.3 % OT SOLN
5.0000 [drp] | Freq: Two times a day (BID) | OTIC | 0 refills | Status: DC
Start: 2022-01-10 — End: 2022-02-19

## 2022-01-10 MED ORDER — LORATADINE 10 MG PO TABS: 10.0000 mg | ORAL_TABLET | Freq: Every day | ORAL | 0 refills | Status: DC

## 2022-01-10 NOTE — Discharge Instructions (Signed)
You have been seen today for your complaint of influenza like illness. Your lab work will be available in Doctor, hospital. Your discharge medications include ciprofloxacin. This is an antibiotic drop. You should follow the directions on the bottle. Home care instructions are as follows:  Drink plenty of fluids.  You should stay away from other people for the next 3 days. Follow up with: your PCP in 7 days for reassessment Please seek immediate medical care if you develop any of the following symptoms: You have redness, swelling, and pain or tenderness in the area behind your ear. Develop shortness of breath or have difficulty breathing. Have skin or nails that turn a bluish color. Have severe pain or stiffness in your neck. Develop a sudden headache or sudden pain in your face or ear. Cannot eat or drink without vomiting. At this time there does not appear to be the presence of an emergent medical condition, however there is always the potential for conditions to change. Please read and follow the below instructions.  Do not take your medicine if  develop an itchy rash, swelling in your mouth or lips, or difficulty breathing; call [phone] and seek immediate emergency medical attention if this occurs.  You may review your lab tests and imaging results in their entirety on your MyChart account.  Please discuss all results of fully with your primary care provider and other specialist at your follow-up visit.  Note: Portions of this text may have been transcribed using voice recognition software. Every effort was made to ensure accuracy; however, inadvertent computerized transcription errors may still be present.

## 2022-01-10 NOTE — ED Triage Notes (Signed)
Pt here from home with c/o congestion and left ear pain throat is red

## 2022-01-10 NOTE — ED Provider Notes (Signed)
[facility] EMER[facility]ENC[facility] DEPT Provider Note   CSN: [id number] Arrival date & time: 01/10/22  0813     History  Chief Complaint  Patient presents with   Nasal Congestion   Ear Fullness    Kelli Thompson is a 56 y.o. female.  With a history of hypertension who presents to the ED for evaluation of influenza-like illness.  She has dry cough, sore throat, congestion, rhinorrhea, itchy and watery eyes, ear fullness, and left ear pain.  She has been around her grandchildren who have all been sick recently.  Symptoms have been present for 2 days.  Denies productive cough, shortness of breath, chest pain, fevers.  She is still able to eat and drink like normal.   Ear Fullness       Home Medications Prior to Admission medications   Medication Sig Start Date End Date Taking? Authorizing Provider  ciprofloxacin-hydrocortisone (CIPRO HC) OTIC suspension Place 3 drops into the left ear 2 (two) times daily for 7 days. 01/10/22 01/17/22 Yes Anorah Trias, [place] Folk, PA-C  fluticasone (FLONASE) 50 MCG/ACT nasal spray Place 1 spray into both nostrils daily. 01/10/22  Yes Arben Packman, [place]on Folk, PA-C  loratadine (CLARITIN) 10 MG tablet Take 1 tablet (10 mg total) by mouth daily. 01/10/22  Yes Torrin Crihfield, [place]on Folk, PA-C  amLODipine (NORVASC) 5 MG tablet Take 1 tablet (5 mg total) by mouth daily. TAKE ONE TABLET BY MOUTH DAILY FOR BLOOD PRESSURE 02/26/21   Claretta Fraise, MD  benzonatate (TESSALON) 200 MG capsule Take 1 capsule (200 mg total) by mouth 3 (three) times daily as needed for cough. 02/26/21   Claretta Fraise, MD  carvedilol (COREG) 12.5 MG tablet TAKE 1 TABLET BY MOUTH TWICE A DAY (NEEDS TO BE SEEN BEFORE NEXT REFILL) 12/29/21   Claretta Fraise, MD  DULoxetine (CYMBALTA) 60 MG capsule TAKE ONE CAPSULE BY MOUTH DAILY WITH SUPPER 10/28/21   Claretta Fraise, MD  famotidine (PEPCID) 20 MG tablet Take 1 tablet (20 mg total) by mouth 2 (two) times daily. (NEEDS TO BE SEEN BEFORE NEXT REFILL) 02/26/21    Claretta Fraise, MD  furosemide (LASIX) 40 MG tablet TAKE ONE TABLET BY MOUTH DAILY 08/12/21   Claretta Fraise, MD  meloxicam (MOBIC) 15 MG tablet Take 1 tablet daily as needed for knee pain. 08/22/21   Claretta Fraise, MD  methocarbamol (ROBAXIN) 500 MG tablet Take 1 tablet (500 mg total) by mouth every 8 (eight) hours as needed for muscle spasms. 05/11/21   Davonna Belling, MD  olmesartan (BENICAR) 40 MG tablet Take 1 tablet (40 mg total) by mouth daily. 09/15/21   Claretta Fraise, MD  potassium chloride SA (KLOR-CON M20) 20 MEQ tablet TAKE ONE TABLET BY MOUTH DAILY FOR POTASSIUM 08/12/21   Claretta Fraise, MD  predniSONE (DELTASONE) 20 MG tablet One twice daily with food for 2 weeks. Then one daily for 2 weeks 08/22/21   Claretta Fraise, MD  pregabalin (LYRICA) 200 MG capsule Take 2 capsules (400 mg total) by mouth at bedtime. 08/22/21   Claretta Fraise, MD      Allergies    Ace inhibitors and Other    Review of Systems   Review of Systems  HENT:  Positive for congestion, rhinorrhea and sore throat.   Respiratory:  Positive for cough.   All other systems reviewed and are negative.   Physical Exam Updated Vital Signs BP 125/84   Pulse 97   Temp 98.4 F (36.9 C) (Oral)   Resp 18   Ht '5\' 5"'$  (1.651  m)   Wt 111.6 kg   SpO2 95%   BMI 40.94 kg/m  Physical Exam Vitals and nursing note reviewed.  Constitutional:      General: She is not in acute distress.    Appearance: Normal appearance. She is well-developed. She is obese. She is not ill-appearing, toxic-appearing or diaphoretic.  HENT:     Head: Normocephalic and atraumatic.     Right Ear: Tympanic membrane, ear canal and external ear normal.     Left Ear: Tympanic membrane and external ear normal.     Ears:     Comments: Left ear canal appears macerated, no drainage, normal TM, no mastoid tenderness, no tragal tenderness    Nose: Congestion and rhinorrhea present.     Mouth/Throat:     Mouth: Mucous membranes are moist.     Tonsils:  2+ on the right. 2+ on the left.     Comments: Tonsils 2+ bilaterally with tonsil stones.  No exudates.  Uvula midline.  Posterior pharynx slightly erythematous. Eyes:     Conjunctiva/sclera: Conjunctivae normal.  Cardiovascular:     Rate and Rhythm: Normal rate and regular rhythm.     Pulses: Normal pulses.     Heart sounds: No murmur heard. Pulmonary:     Effort: Pulmonary effort is normal. No respiratory distress.     Breath sounds: Normal breath sounds. No stridor. No wheezing, rhonchi or rales.  Abdominal:     Palpations: Abdomen is soft.     Tenderness: There is no abdominal tenderness. There is no guarding.  Musculoskeletal:        General: No swelling.     Cervical back: Neck supple.     Right lower leg: No edema.     Left lower leg: No edema.  Skin:    General: Skin is warm and dry.     Capillary Refill: Capillary refill takes less than 2 seconds.  Neurological:     General: No focal deficit present.     Mental Status: She is alert and oriented to person, place, and time.  Psychiatric:        Mood and Affect: Mood normal.     ED Results / Procedures / Treatments   Labs (all labs ordered are listed, but only abnormal results are displayed) Labs Reviewed  RESP PANEL BY RT-PCR (RSV, FLU A&B, COVID)  [id number]    EKG None  Radiology No results found.  Procedures Procedures    Medications Ordered in ED Medications - No data to display  ED Course/ Medical Decision Making/ A&P                           Medical Decision Making Risk OTC drugs. Prescription drug management.  This patient presents to the ED for concern of influenza-like illness, this involves an extensive number of treatment options, and is a complaint that carries with it a high risk of complications and morbidity.  The differential diagnosis includes influenza, COVID, RSV, other URI, pneumonia   Co morbidities that complicate the patient evaluation   hypertension  My initial workup  includes respiratory panel, symptomatic treatment  Additional history obtained from: Nursing notes from this visit.   I ordered, reviewed and interpreted labs which include: Respiratory panel.  Pending at the time of discharge.  Afebrile, hemodynamically stable.  56 year old female presenting to the ED for evaluation of URI type symptoms.  Physical exam remarkable for left-sided otitis externa, congestion, rhinorrhea.  Is  otherwise reassuring.  There are no adventitious breath sounds. She is coughing and has no posterior pharyngeal exudates making strep throat unlikely.  Respiratory panel was ordered and performed.  Patient is outside of the window for treatment for influenza and has no significant comorbidities requiring Paxlovid.  She was encouraged to follow-up with her results on her MyChart and to quarantine for the next 3 days.  She was given information regarding symptomatic treatment including Claritin, Flonase, sparing use of Afrin, Tylenol and ibuprofen for fevers or bodyaches.  Strongly encouraged to follow-up with her primary care provider for reassessment in 1 week.  Stable at discharge.  At this time there does not appear to be any evidence of an acute emergency medical condition and the patient appears stable for discharge with appropriate outpatient follow up. Diagnosis was discussed with patient who verbalizes understanding of care plan and is agreeable to discharge. I have discussed return precautions with patient who verbalizes understanding. Patient encouraged to follow-up with their PCP within 1 week. All questions answered.  Note: Portions of this report may have been transcribed using voice recognition software. Every effort was made to ensure accuracy; however, inadvertent computerized transcription errors may still be present.          Final Clinical Impression(s) / ED Diagnoses Final diagnoses:  Acute otitis externa of left ear, unspecified type  Influenza-like  illness    Rx / DC Orders ED Discharge Orders          Ordered    ciprofloxacin-hydrocortisone (CIPRO HC) OTIC suspension  2 times daily        01/10/22 0849    loratadine (CLARITIN) 10 MG tablet  Daily        01/10/22 0849    fluticasone (FLONASE) 50 MCG/ACT nasal spray  Daily        01/10/22 0849              Roylene Reason, PA-C 01/10/22 [id number]    Sherwood Gambler, MD 01/10/22 [phone]

## 2022-01-13 ENCOUNTER — Telehealth: Payer: Self-pay

## 2022-01-13 NOTE — Telephone Encounter (Signed)
Transition Care Management Follow-up Telephone Call Date of discharge and from where: 01/10/22 [facility]  How have you been since you were released from the hospital? Patient states that she is doing same - getting wore. Patient is now having pain in both ears. Coughing worse at night and causing difficulty breathing   Any questions or concerns? Yes  Items Reviewed: Did the pt receive and understand the discharge instructions provided? Yes  Medications obtained and verified? Yes  Other? No  Any new allergies since your discharge? No  Dietary orders reviewed? Yes Do you have support at home? Yes   Home Care and Equipment/Supplies: Were home health services ordered? not applicable If so, what is the name of the agency? na  Has the agency set up a time to come to the patient's home? not applicable Were any new equipment or medical supplies ordered?  No What is the name of the medical supply agency? na Were you able to get the supplies/equipment? not applicable Do you have any questions related to the use of the equipment or supplies? No  Functional Questionnaire: (I = Independent and D = Dependent) ADLs: I  Bathing/Dressing- I  Meal Prep- I  Eating- I  Maintaining continence- I  Transferring/Ambulation- I  Managing Meds- I  Follow up appointments reviewed:  PCP Hospital f/u appt confirmed? Yes  Scheduled to see DOD on 01/15/22 @ 205pm. [facility] f/u appt confirmed? No   Are transportation arrangements needed? No  If their condition worsens, is the pt aware to call PCP or go to the Emergency Dept.? Yes Was the patient provided with contact information for the PCP's office or ED? Yes Was to pt encouraged to call back with questions or concerns? Yes

## 2022-01-15 ENCOUNTER — Encounter: Payer: Self-pay | Admitting: Family Medicine

## 2022-01-15 ENCOUNTER — Ambulatory Visit (INDEPENDENT_AMBULATORY_CARE_PROVIDER_SITE_OTHER): Payer: Self-pay | Admitting: Family Medicine

## 2022-01-15 VITALS — BP 134/87 | HR 91 | Temp 97.7°F | Ht 65.0 in | Wt 248.4 lb

## 2022-01-15 DIAGNOSIS — J069 Acute upper respiratory infection, unspecified: Secondary | ICD-10-CM

## 2022-01-15 DIAGNOSIS — H66003 Acute suppurative otitis media without spontaneous rupture of ear drum, bilateral: Secondary | ICD-10-CM

## 2022-01-15 MED ORDER — METHYLPREDNISOLONE ACETATE 40 MG/ML IJ SUSP
40.0000 mg | Freq: Once | INTRAMUSCULAR | Status: AC
  Administered 2022-01-15: 40 mg via INTRAMUSCULAR

## 2022-01-15 MED ORDER — AMOXICILLIN-POT CLAVULANATE 875-125 MG PO TABS: 1.0000 | ORAL_TABLET | Freq: Two times a day (BID) | ORAL | 0 refills | Status: DC

## 2022-01-15 NOTE — Progress Notes (Signed)
Subjective:  Patient ID: Kelli Thompson, female    DOB: 1965/12/05, 56 y.o.   MRN: [id number]  Patient Care Team: Kelli Fraise, MD as PCP - General (Family Medicine) Kelli Harman, DO as Consulting Physician (Internal Medicine)   Chief Complaint:  Cough, Nasal Congestion, and Ear Pain (Patient seen at [facility] Emergency Dept on 12/15 )   HPI: LESLY Thompson is a 56 y.o. female presenting on 01/15/2022 for Cough, Nasal Congestion, and Ear Pain (Patient seen at [facility] Emergency Dept on 12/15 )   Seen for same in the ED on 01/10/2022. Reports symptoms continue to worsen.  Cough This is a recurrent problem. Episode onset: over 2 weeks ago. The problem has been gradually worsening. The problem occurs every few minutes. The cough is Productive of sputum. Associated symptoms include chills, ear congestion, ear pain, headaches, nasal congestion, postnasal drip, rhinorrhea, a sore throat and wheezing. Pertinent negatives include no chest pain, fever, heartburn, hemoptysis, myalgias, rash, shortness of breath, sweats or weight loss. She has tried OTC cough suppressant and prescription cough suppressant for the symptoms. The treatment provided no relief.  Otalgia  There is pain in both ears. This is a recurrent problem. Episode onset: over 2 weeks ago. The problem has been gradually worsening. The pain is moderate. Associated symptoms include coughing, headaches, hearing loss, rhinorrhea and a sore throat. Pertinent negatives include no abdominal pain, diarrhea, ear discharge, neck pain, rash or vomiting. She has tried acetaminophen and ear drops (delsym, DayQuil) for the symptoms. The treatment provided no relief.     Relevant past medical, surgical, family, and social history reviewed and updated as indicated.  Allergies and medications reviewed and updated. Data reviewed: Chart in Epic.   Past Medical History:  Diagnosis Date   Anxiety     Arthritis    Depression    GERD (gastroesophageal reflux disease)    Hypertension    Pancreatitis    around 2013; x1 secondary to lisinopril per patient   Peri-menopause 02/28/2014   Urinary frequency 01/30/2014   Urinary incontinence, mixed 01/30/2014    Past Surgical History:  Procedure Laterality Date   ABDOMINAL HYSTERECTOMY     APPENDECTOMY     BREAST BIOPSY Right 02/20/2020   Procedure: BREAST EXCISIONAL BIOPSY AFTER SEED LOCATION;  Surgeon: Virl Cagey, MD;  Location: [facility] ORS;  Service: General;  Laterality: Right;   cervical discectomy and fusion     CESAREAN SECTION     x2   CHOLECYSTECTOMY     COLONOSCOPY WITH PROPOFOL N/A 02/02/2020   Procedure: COLONOSCOPY WITH PROPOFOL;  Surgeon: Kelli Harman, DO;  Location: [facility] ENDO SUITE;  Service: Endoscopy;  Laterality: N/A;  8:15am    Social History   Socioeconomic History   Marital status: Married    Spouse name: Not on file   Number of children: Not on file   Years of education: Not on file   Highest education level: Not on file  Occupational History   Not on file  Tobacco Use   Smoking status: Former    Packs/day: 0.50    Years: 30.00    Total pack years: 15.00    Types: Cigarettes    Quit date: 03/27/2013    Years since quitting: 8.8   Smokeless tobacco: Never  Vaping Use   Vaping Use: Never used  Substance and Sexual Activity   Alcohol use: No   Drug use: No   Sexual activity: Yes  Birth control/protection: Surgical    Comment: hyst  Other Topics Concern   Not on file  Social History Narrative   Not on file   Social Determinants of Health   Financial Resource Strain: Not on file  Food Insecurity: Not on file  Transportation Needs: Not on file  Physical Activity: Not on file  Stress: Not on file  Social Connections: Not on file  Intimate Partner Violence: Not on file    Outpatient Encounter Medications as of 01/15/2022  Medication Sig   amLODipine (NORVASC) 5 MG tablet Take 1 tablet (5 mg  total) by mouth daily. TAKE ONE TABLET BY MOUTH DAILY FOR BLOOD PRESSURE   amoxicillin-clavulanate (AUGMENTIN) 875-125 MG tablet Take 1 tablet by mouth 2 (two) times daily.   carvedilol (COREG) 12.5 MG tablet TAKE 1 TABLET BY MOUTH TWICE A DAY (NEEDS TO BE SEEN BEFORE NEXT REFILL)   DULoxetine (CYMBALTA) 60 MG capsule TAKE ONE CAPSULE BY MOUTH DAILY WITH SUPPER   famotidine (PEPCID) 20 MG tablet Take 1 tablet (20 mg total) by mouth 2 (two) times daily. (NEEDS TO BE SEEN BEFORE NEXT REFILL)   fluticasone (FLONASE) 50 MCG/ACT nasal spray Place 1 spray into both nostrils daily.   furosemide (LASIX) 40 MG tablet TAKE ONE TABLET BY MOUTH DAILY   loratadine (CLARITIN) 10 MG tablet Take 1 tablet (10 mg total) by mouth daily.   meloxicam (MOBIC) 15 MG tablet Take 1 tablet daily as needed for knee pain.   methocarbamol (ROBAXIN) 500 MG tablet Take 1 tablet (500 mg total) by mouth every 8 (eight) hours as needed for muscle spasms.   ofloxacin (FLOXIN) 0.3 % OTIC solution Place 5 drops into the left ear 2 (two) times daily.   olmesartan (BENICAR) 40 MG tablet Take 1 tablet (40 mg total) by mouth daily.   potassium chloride SA (KLOR-CON M20) 20 MEQ tablet TAKE ONE TABLET BY MOUTH DAILY FOR POTASSIUM   pregabalin (LYRICA) 200 MG capsule Take 2 capsules (400 mg total) by mouth at bedtime.   [DISCONTINUED] benzonatate (TESSALON) 200 MG capsule Take 1 capsule (200 mg total) by mouth 3 (three) times daily as needed for cough.   [DISCONTINUED] predniSONE (DELTASONE) 20 MG tablet One twice daily with food for 2 weeks. Then one daily for 2 weeks   [EXPIRED] methylPREDNISolone acetate (DEPO-MEDROL) injection 40 mg    No facility-administered encounter medications on file as of 01/15/2022.    Allergies  Allergen Reactions   Ace Inhibitors     pancreatitis   Other     Any of the -prils, manly lisinopril    Review of Systems  Constitutional:  Positive for activity change, appetite change and chills. Negative  for diaphoresis, fatigue, fever, unexpected weight change and weight loss.  HENT:  Positive for congestion, ear pain, hearing loss, postnasal drip, rhinorrhea, sinus pressure, sinus pain and sore throat. Negative for dental problem, drooling, ear discharge, facial swelling, mouth sores, nosebleeds, sneezing, tinnitus, trouble swallowing and voice change.   Eyes: Negative.  Negative for photophobia and visual disturbance.  Respiratory:  Positive for cough and wheezing. Negative for apnea, hemoptysis, choking, chest tightness, shortness of breath and stridor.   Cardiovascular:  Negative for chest pain, palpitations and leg swelling.  Gastrointestinal:  Negative for abdominal pain, blood in stool, constipation, diarrhea, heartburn, nausea and vomiting.  Endocrine: Negative.  Negative for polydipsia, polyphagia and polyuria.  Genitourinary:  Negative for decreased urine volume, difficulty urinating, dysuria, frequency and urgency.  Musculoskeletal:  Negative for arthralgias,  myalgias and neck pain.  Skin: Negative.  Negative for rash.  Allergic/Immunologic: Negative.   Neurological:  Positive for headaches. Negative for dizziness, tremors, seizures, syncope, facial asymmetry, speech difficulty, weakness, light-headedness and numbness.  Hematological: Negative.   Psychiatric/Behavioral:  Negative for confusion, hallucinations, sleep disturbance and suicidal ideas.   All other systems reviewed and are negative.       Objective:  BP 134/87   Pulse 91   Temp 97.7 F (36.5 C) (Temporal)   Ht '5\' 5"'$  (1.651 m)   Wt 248 lb 6.4 oz (112.7 kg)   SpO2 94%   BMI 41.34 kg/m    Wt Readings from Last 3 Encounters:  01/15/22 248 lb 6.4 oz (112.7 kg)  01/10/22 246 lb (111.6 kg)  08/22/21 248 lb 6.4 oz (112.7 kg)    Physical Exam Vitals and nursing note reviewed.  Constitutional:      General: She is not in acute distress.    Appearance: Normal appearance. She is well-developed and well-groomed. She  is obese. She is not ill-appearing, toxic-appearing or diaphoretic.  HENT:     Head: Normocephalic and atraumatic.     Jaw: There is normal jaw occlusion.     Right Ear: Hearing normal. Tympanic membrane is erythematous and bulging.     Left Ear: Hearing normal. Tympanic membrane is erythematous and bulging.     Nose: Congestion and rhinorrhea present.     Mouth/Throat:     Lips: Pink.     Mouth: Mucous membranes are moist.     Pharynx: Oropharynx is clear. Uvula midline. Posterior oropharyngeal erythema present. No pharyngeal swelling or oropharyngeal exudate.  Eyes:     General: Lids are normal.     Extraocular Movements: Extraocular movements intact.     Conjunctiva/sclera: Conjunctivae normal.     Pupils: Pupils are equal, round, and reactive to light.  Neck:     Thyroid: No thyroid mass, thyromegaly or thyroid tenderness.     Vascular: No JVD.     Trachea: Trachea and phonation normal.  Cardiovascular:     Rate and Rhythm: Normal rate and regular rhythm.     Chest Wall: PMI is not displaced.     Pulses: Normal pulses.     Heart sounds: Normal heart sounds. No murmur heard.    No friction rub. No gallop.  Pulmonary:     Effort: Pulmonary effort is normal. No respiratory distress.     Breath sounds: Wheezing (expiratory) present.     Comments: Dry cough Abdominal:     General: There is no abdominal bruit.     Palpations: There is no hepatomegaly or splenomegaly.  Musculoskeletal:     Cervical back: Normal range of motion and neck supple.  Skin:    General: Skin is warm and dry.     Capillary Refill: Capillary refill takes less than 2 seconds.     Coloration: Skin is not cyanotic, jaundiced or pale.     Findings: No rash.  Neurological:     General: No focal deficit present.     Mental Status: She is alert and oriented to person, place, and time.     Sensory: Sensation is intact.     Motor: Motor function is intact.     Coordination: Coordination is intact.     Gait:  Gait is intact.     Deep Tendon Reflexes: Reflexes are normal and symmetric.  Psychiatric:        Attention and Perception: Attention and perception normal.  Mood and Affect: Mood and affect normal.        Speech: Speech normal.        Behavior: Behavior normal. Behavior is cooperative.        Thought Content: Thought content normal.        Cognition and Memory: Cognition and memory normal.        Judgment: Judgment normal.     Results for orders placed or performed during the hospital encounter of 01/10/22  Resp panel by RT-PCR (RSV, Flu A&B, Covid) Anterior Nasal Swab   Specimen: Anterior Nasal Swab  Result Value Ref Range   SARS Coronavirus 2 by RT PCR NEGATIVE NEGATIVE   Influenza A by PCR NEGATIVE NEGATIVE   Influenza B by PCR NEGATIVE NEGATIVE   Resp Syncytial Virus by PCR NEGATIVE NEGATIVE       Pertinent labs & imaging results that were available during my care of the patient were reviewed by me and considered in my medical decision making.  Assessment & Plan:  Karrington was seen today for cough, nasal congestion and ear pain.  Diagnoses and all orders for this visit:  Non-recurrent acute suppurative otitis media of both ears without spontaneous rupture of tympanic membranes Bilateral, left worse than right. Start antibiotics as prescribed. Continue symptomatic care at home. Report new, worsening, or persistent symptoms.  -     amoxicillin-clavulanate (AUGMENTIN) 875-125 MG tablet; Take 1 tablet by mouth 2 (two) times daily.  URI with cough and congestion Burst of steroids in office today. Continue symptomatic care at home. Report new, worsening, or persistent symptoms.  -     methylPREDNISolone acetate (DEPO-MEDROL) injection 40 mg     Continue all other maintenance medications.  Follow up plan: Return if symptoms worsen or fail to improve.   Continue healthy lifestyle choices, including diet (rich in fruits, vegetables, and lean proteins, and low in salt  and simple carbohydrates) and exercise (at least 30 minutes of moderate physical activity daily).  Educational handout given for otitis media   The above assessment and management plan was discussed with the patient. The patient verbalized understanding of and has agreed to the management plan. Patient is aware to call the clinic if they develop any new symptoms or if symptoms persist or worsen. Patient is aware when to return to the clinic for a follow-up visit. Patient educated on when it is appropriate to go to the emergency department.   Monia Pouch, FNP-C [facility] [phone]

## 2022-01-26 ENCOUNTER — Other Ambulatory Visit: Payer: Self-pay | Admitting: Family Medicine

## 2022-01-26 DIAGNOSIS — I10 Essential (primary) hypertension: Secondary | ICD-10-CM

## 2022-01-28 ENCOUNTER — Encounter: Payer: Self-pay | Admitting: Family Medicine

## 2022-01-28 NOTE — Telephone Encounter (Signed)
Stacks NTBS 30 days given 12/30/21

## 2022-01-28 NOTE — Telephone Encounter (Signed)
TRIED TO CALL PT TO SCHEDULE APPT BUT NO ANSWER. WILL MAIL LETTER

## 2022-02-17 ENCOUNTER — Other Ambulatory Visit: Payer: Self-pay | Admitting: Family Medicine

## 2022-02-17 DIAGNOSIS — I10 Essential (primary) hypertension: Secondary | ICD-10-CM

## 2022-02-19 ENCOUNTER — Encounter: Payer: Self-pay | Admitting: Family Medicine

## 2022-02-19 ENCOUNTER — Telehealth (INDEPENDENT_AMBULATORY_CARE_PROVIDER_SITE_OTHER): Payer: Self-pay | Admitting: Family Medicine

## 2022-02-19 DIAGNOSIS — M5000 Cervical disc disorder with myelopathy, unspecified cervical region: Secondary | ICD-10-CM

## 2022-02-19 DIAGNOSIS — I10 Essential (primary) hypertension: Secondary | ICD-10-CM

## 2022-02-19 DIAGNOSIS — F32A Depression, unspecified: Secondary | ICD-10-CM

## 2022-02-19 MED ORDER — CARVEDILOL 12.5 MG PO TABS: ORAL_TABLET | ORAL | 0 refills | Status: DC

## 2022-02-19 MED ORDER — OLMESARTAN MEDOXOMIL-HCTZ 40-25 MG PO TABS: 1.0000 | ORAL_TABLET | Freq: Every day | ORAL | 3 refills | Status: DC

## 2022-02-19 MED ORDER — DULOXETINE HCL 60 MG PO CPEP: ORAL_CAPSULE | ORAL | 1 refills | Status: DC

## 2022-02-19 MED ORDER — AMLODIPINE BESYLATE 5 MG PO TABS: 5.0000 mg | ORAL_TABLET | Freq: Every day | ORAL | 3 refills | Status: DC

## 2022-02-19 NOTE — Progress Notes (Signed)
Subjective:  Patient ID: Kelli Thompson, female    DOB: May 07, 1965  Age: 57 y.o. MRN: [id number]  CC: No chief complaint on file.   HPI Kelli Thompson presents for resolved neck pain. Not using Lyrica, methocarbamol or meloxicam since she went through the surgery.  She has not needed since it relieved her pain so well.  She had some swelling while she was traveling to [place] last month for her father's funeral but now that she is home the swelling is gone away.  She would like to combine the furosemide and the omeprazole and to a single pill with HCTZ if possible.  Blood pressure went up briefly at the time of her father's funeral but it is back down and doing well now.     08/22/2021    8:00 AM 02/26/2021    8:16 AM 09/17/2020   10:18 AM  Depression screen PHQ 2/9  Decreased Interest 0 0 0  Down, Depressed, Hopeless 0 0 0  PHQ - 2 Score 0 0 0  Altered sleeping   1  Tired, decreased energy   1  Change in appetite   3  Feeling bad or failure about yourself    0  Trouble concentrating   0  Moving slowly or fidgety/restless   0  Suicidal thoughts   0  PHQ-9 Score   5  Difficult doing work/chores   Somewhat difficult    History Elham has a past medical history of Anxiety, Arthritis, Depression, GERD (gastroesophageal reflux disease), Hypertension, Pancreatitis, Peri-menopause (02/28/2014), Urinary frequency (01/30/2014), and Urinary incontinence, mixed (01/30/2014).   She has a past surgical history that includes Cesarean section; Cholecystectomy; Appendectomy; Abdominal hysterectomy; Colonoscopy with propofol (N/A, 02/02/2020); Breast biopsy (Right, 02/20/2020); and cervical discectomy and fusion.   Her family history includes Alcohol abuse in her paternal grandfather; Arthritis in her maternal grandmother; Asthma in her father; Cancer in her paternal grandmother; Congestive Heart Failure in her maternal grandmother; Diabetes in her maternal grandmother and mother; Hypertension in her  father and mother; Other in her son; Parkinson's disease in her maternal grandfather; Stroke in her father.She reports that she quit smoking about 8 years ago. Her smoking use included cigarettes. She has a 15.00 pack-year smoking history. She has never used smokeless tobacco. She reports that she does not drink alcohol and does not use drugs.    ROS Review of Systems  Constitutional: Negative.   HENT: Negative.    Eyes:  Positive for itching. Negative for visual disturbance.  Respiratory:  Negative for shortness of breath.   Cardiovascular:  Positive for leg swelling (when she travels). Negative for chest pain.  Gastrointestinal:  Negative for abdominal pain.  Musculoskeletal:  Negative for arthralgias.    Objective:  There were no vitals taken for this visit.  BP Readings from Last 3 Encounters:  01/15/22 134/87  01/10/22 125/84  08/22/21 133/86    Wt Readings from Last 3 Encounters:  01/15/22 248 lb 6.4 oz (112.7 kg)  01/10/22 246 lb (111.6 kg)  08/22/21 248 lb 6.4 oz (112.7 kg)     Exam deferred. Pt. Harboring due to COVID 19. Phone visit performed.     Assessment & Plan:   Diagnoses and all orders for this visit:  Essential hypertension -     amLODipine (NORVASC) 5 MG tablet; Take 1 tablet (5 mg total) by mouth daily. TAKE ONE TABLET BY MOUTH DAILY FOR BLOOD PRESSURE -     carvedilol (COREG) 12.5 MG tablet; TAKE  ONE TABLET BY MOUTH TWICE A DAY  Depression, unspecified depression type -     DULoxetine (CYMBALTA) 60 MG capsule; TAKE ONE CAPSULE BY MOUTH DAILY WITH SUPPER  Cervical disc disease with myelopathy  Other orders -     olmesartan-hydrochlorothiazide (BENICAR HCT) 40-25 MG tablet; Take 1 tablet by mouth daily. For BP & Fluid       I have discontinued Kelli Thompson's methocarbamol, furosemide, meloxicam, pregabalin, olmesartan, fluticasone, ofloxacin, and amoxicillin-clavulanate. I am also having her start on olmesartan-hydrochlorothiazide.  Additionally, I am having her maintain her famotidine, potassium chloride SA, loratadine, amLODipine, carvedilol, and DULoxetine.  Allergies as of 02/19/2022       Reactions   Ace Inhibitors    pancreatitis   Other    Any of the -prils, manly lisinopril        Medication List        Accurate as of February 19, 2022  5:07 PM. If you have any questions, ask your nurse or doctor.          STOP taking these medications    amoxicillin-clavulanate 875-125 MG tablet Commonly known as: AUGMENTIN Stopped by: Claretta Fraise, MD   fluticasone 50 MCG/ACT nasal spray Commonly known as: FLONASE Stopped by: Claretta Fraise, MD   furosemide 40 MG tablet Commonly known as: LASIX Stopped by: Claretta Fraise, MD   meloxicam 15 MG tablet Commonly known as: Mobic Stopped by: Claretta Fraise, MD   methocarbamol 500 MG tablet Commonly known as: Robaxin Stopped by: Claretta Fraise, MD   ofloxacin 0.3 % OTIC solution Commonly known as: FLOXIN Stopped by: Claretta Fraise, MD   olmesartan 40 MG tablet Commonly known as: BENICAR Stopped by: Claretta Fraise, MD   pregabalin 200 MG capsule Commonly known as: Lyrica Stopped by: Claretta Fraise, MD       TAKE these medications    amLODipine 5 MG tablet Commonly known as: NORVASC Take 1 tablet (5 mg total) by mouth daily. TAKE ONE TABLET BY MOUTH DAILY FOR BLOOD PRESSURE   carvedilol 12.5 MG tablet Commonly known as: COREG TAKE ONE TABLET BY MOUTH TWICE A DAY   DULoxetine 60 MG capsule Commonly known as: CYMBALTA TAKE ONE CAPSULE BY MOUTH DAILY WITH SUPPER   famotidine 20 MG tablet Commonly known as: PEPCID Take 1 tablet (20 mg total) by mouth 2 (two) times daily. (NEEDS TO BE SEEN BEFORE NEXT REFILL)   loratadine 10 MG tablet Commonly known as: CLARITIN Take 1 tablet (10 mg total) by mouth daily.   olmesartan-hydrochlorothiazide 40-25 MG tablet Commonly known as: Benicar HCT Take 1 tablet by mouth daily. For BP & Fluid Started by:  Claretta Fraise, MD   potassium chloride SA 20 MEQ tablet Commonly known as: Klor-Con M20 TAKE ONE TABLET BY MOUTH DAILY FOR POTASSIUM       Virtual Visit via telephone Note  I discussed the limitations, risks, security and privacy concerns of performing an evaluation and management service by telephone and the availability of in person appointments. I also discussed with the patient that there may be a patient responsible charge related to this service. The patient expressed understanding and agreed to proceed. Pt. Is at home. Dr. Livia Snellen is in his office.  Follow Up Instructions:   I discussed the assessment and treatment plan with the patient. The patient was provided an opportunity to ask questions and all were answered. The patient agreed with the plan and demonstrated an understanding of the instructions.   The patient was advised  to call back or seek an in-person evaluation if the symptoms worsen or if the condition fails to improve as anticipated.  Total minutes including chart review and phone contact time: 21   Follow-up: Return in about 6 months (around 08/20/2022).  Claretta Fraise, M.D.

## 2022-03-25 ENCOUNTER — Encounter: Payer: Self-pay | Admitting: Family Medicine

## 2022-03-26 ENCOUNTER — Encounter (HOSPITAL_COMMUNITY): Payer: Self-pay | Admitting: Emergency Medicine

## 2022-03-26 ENCOUNTER — Emergency Department (HOSPITAL_COMMUNITY): Payer: Self-pay

## 2022-03-26 ENCOUNTER — Other Ambulatory Visit: Payer: Self-pay

## 2022-03-26 ENCOUNTER — Inpatient Hospital Stay (HOSPITAL_COMMUNITY)
Admission: EM | Admit: 2022-03-26 | Discharge: 2022-03-29 | DRG: 440 | Disposition: A | Discharge status: Home / Self Care | Payer: Self-pay | Attending: Family Medicine | Admitting: Family Medicine

## 2022-03-26 DIAGNOSIS — Z820 Family history of epilepsy and other diseases of the nervous system: Secondary | ICD-10-CM

## 2022-03-26 DIAGNOSIS — K859 Acute pancreatitis without necrosis or infection, unspecified: Principal | ICD-10-CM | POA: Diagnosis present

## 2022-03-26 DIAGNOSIS — Z823 Family history of stroke: Secondary | ICD-10-CM

## 2022-03-26 DIAGNOSIS — Z809 Family history of malignant neoplasm, unspecified: Secondary | ICD-10-CM

## 2022-03-26 DIAGNOSIS — E876 Hypokalemia: Secondary | ICD-10-CM | POA: Diagnosis present

## 2022-03-26 DIAGNOSIS — F32A Depression, unspecified: Secondary | ICD-10-CM | POA: Diagnosis present

## 2022-03-26 DIAGNOSIS — Z90710 Acquired absence of both cervix and uterus: Secondary | ICD-10-CM

## 2022-03-26 DIAGNOSIS — M899 Disorder of bone, unspecified: Secondary | ICD-10-CM | POA: Diagnosis present

## 2022-03-26 DIAGNOSIS — I10 Essential (primary) hypertension: Secondary | ICD-10-CM | POA: Diagnosis present

## 2022-03-26 DIAGNOSIS — Z833 Family history of diabetes mellitus: Secondary | ICD-10-CM

## 2022-03-26 DIAGNOSIS — Z825 Family history of asthma and other chronic lower respiratory diseases: Secondary | ICD-10-CM

## 2022-03-26 DIAGNOSIS — K219 Gastro-esophageal reflux disease without esophagitis: Secondary | ICD-10-CM | POA: Diagnosis present

## 2022-03-26 DIAGNOSIS — F419 Anxiety disorder, unspecified: Secondary | ICD-10-CM | POA: Diagnosis present

## 2022-03-26 DIAGNOSIS — Z8249 Family history of ischemic heart disease and other diseases of the circulatory system: Secondary | ICD-10-CM

## 2022-03-26 DIAGNOSIS — Z87891 Personal history of nicotine dependence: Secondary | ICD-10-CM

## 2022-03-26 LAB — CBC WITH DIFFERENTIAL/PLATELET
Abs Immature Granulocytes: 0.02 10*3/uL (ref 0.00–0.07)
Basophils Absolute: 0 10*3/uL (ref 0.0–0.1)
Basophils Relative: 0 %
Eosinophils Absolute: 0.3 10*3/uL (ref 0.0–0.5)
Eosinophils Relative: 3 %
HCT: 38.4 % (ref 36.0–46.0)
Hemoglobin: 12.7 g/dL (ref 12.0–15.0)
Immature Granulocytes: 0 %
Lymphocytes Relative: 13 %
Lymphs Abs: 1.3 10*3/uL (ref 0.7–4.0)
MCH: 27.2 pg (ref 26.0–34.0)
MCHC: 33.1 g/dL (ref 30.0–36.0)
MCV: 82.2 fL (ref 80.0–100.0)
Monocytes Absolute: 0.5 10*3/uL (ref 0.1–1.0)
Monocytes Relative: 5 %
Neutro Abs: 7.6 10*3/uL (ref 1.7–7.7)
Neutrophils Relative %: 79 %
Platelets: 207 10*3/uL (ref 150–400)
RBC: 4.67 MIL/uL (ref 3.87–5.11)
RDW: 14.5 % (ref 11.5–15.5)
WBC: 9.7 10*3/uL (ref 4.0–10.5)
nRBC: 0 % (ref 0.0–0.2)

## 2022-03-26 LAB — COMPREHENSIVE METABOLIC PANEL
ALT: 20 U/L (ref 0–44)
AST: 14 U/L — ABNORMAL LOW (ref 15–41)
Albumin: 3.9 g/dL (ref 3.5–5.0)
Alkaline Phosphatase: 94 U/L (ref 38–126)
Anion gap: 8 (ref 5–15)
BUN: 14 mg/dL (ref 6–20)
CO2: 27 mmol/L (ref 22–32)
Calcium: 8.7 mg/dL — ABNORMAL LOW (ref 8.9–10.3)
Chloride: 99 mmol/L (ref 98–111)
Creatinine, Ser: 0.65 mg/dL (ref 0.44–1.00)
GFR, Estimated: >60 mL/min (ref >60)
Glucose, Bld: 148 mg/dL — ABNORMAL HIGH (ref 70–99)
Potassium: 2.9 mmol/L — ABNORMAL LOW (ref 3.5–5.1)
Sodium: 134 mmol/L — ABNORMAL LOW (ref 135–145)
Total Bilirubin: 0.9 mg/dL (ref 0.3–1.2)
Total Protein: 7.3 g/dL (ref 6.5–8.1)

## 2022-03-26 LAB — LIPASE, BLOOD: Lipase: 35 U/L (ref 11–51)

## 2022-03-26 LAB — TROPONIN I (HIGH SENSITIVITY)
Troponin I (High Sensitivity): 5 ng/L (ref <18)
Troponin I (High Sensitivity): 4 ng/L (ref <18)

## 2022-03-26 MED ORDER — LIDOCAINE VISCOUS HCL 2 % MT SOLN
15.0000 mL | Freq: Once | OROMUCOSAL | Status: AC
  Administered 2022-03-26: 15 mL via ORAL
  Filled 2022-03-26: qty 15

## 2022-03-26 MED ORDER — ONDANSETRON HCL 4 MG PO TABS: 4.0000 mg | ORAL_TABLET | Freq: Four times a day (QID) | ORAL | Status: DC | PRN

## 2022-03-26 MED ORDER — CARVEDILOL 12.5 MG PO TABS
12.5000 mg | ORAL_TABLET | Freq: Two times a day (BID) | ORAL | Status: DC
  Administered 2022-03-26 – 2022-03-29 (×6): 12.5 mg via ORAL
  Filled 2022-03-26 (×6): qty 1

## 2022-03-26 MED ORDER — POTASSIUM CHLORIDE 10 MEQ/100ML IV SOLN
10.0000 meq | Freq: Once | INTRAVENOUS | Status: AC
  Administered 2022-03-27: 10 meq via INTRAVENOUS
  Filled 2022-03-26: qty 100

## 2022-03-26 MED ORDER — PANTOPRAZOLE SODIUM 40 MG IV SOLR
40.0000 mg | Freq: Two times a day (BID) | INTRAVENOUS | Status: DC
  Administered 2022-03-26 – 2022-03-29 (×6): 40 mg via INTRAVENOUS
  Filled 2022-03-26 (×6): qty 10

## 2022-03-26 MED ORDER — ALUM & MAG HYDROXIDE-SIMETH 200-200-20 MG/5ML PO SUSP
30.0000 mL | Freq: Once | ORAL | Status: AC
  Administered 2022-03-26: 30 mL via ORAL
  Filled 2022-03-26: qty 30

## 2022-03-26 MED ORDER — FENTANYL CITRATE PF 50 MCG/ML IJ SOSY
50.0000 ug | PREFILLED_SYRINGE | INTRAMUSCULAR | Status: DC | PRN
  Administered 2022-03-26: 50 ug via INTRAVENOUS
  Filled 2022-03-26: qty 1

## 2022-03-26 MED ORDER — POTASSIUM CHLORIDE 10 MEQ/100ML IV SOLN
10.0000 meq | INTRAVENOUS | Status: AC
  Administered 2022-03-26 – 2022-03-27 (×5): 10 meq via INTRAVENOUS
  Filled 2022-03-26 (×5): qty 100

## 2022-03-26 MED ORDER — SODIUM CHLORIDE 0.9% FLUSH
3.0000 mL | Freq: Two times a day (BID) | INTRAVENOUS | Status: DC
  Administered 2022-03-26 – 2022-03-29 (×5): 3 mL via INTRAVENOUS

## 2022-03-26 MED ORDER — HYDROMORPHONE HCL 1 MG/ML IJ SOLN
0.5000 mg | INTRAMUSCULAR | Status: DC | PRN
  Administered 2022-03-26 – 2022-03-27 (×2): 0.5 mg via INTRAVENOUS
  Administered 2022-03-27 – 2022-03-28 (×4): 1 mg via INTRAVENOUS
  Filled 2022-03-26: qty 1
  Filled 2022-03-26: qty 0.5
  Filled 2022-03-26 (×3): qty 1
  Filled 2022-03-26: qty 0.5

## 2022-03-26 MED ORDER — ONDANSETRON HCL 4 MG/2ML IJ SOLN: 4.0000 mg | Freq: Four times a day (QID) | INTRAMUSCULAR | Status: DC | PRN

## 2022-03-26 MED ORDER — ONDANSETRON HCL 4 MG/2ML IJ SOLN
4.0000 mg | Freq: Once | INTRAMUSCULAR | Status: AC
  Administered 2022-03-26: 4 mg via INTRAVENOUS
  Filled 2022-03-26: qty 2

## 2022-03-26 MED ORDER — ACETAMINOPHEN 650 MG RE SUPP: 650.0000 mg | Freq: Four times a day (QID) | RECTAL | Status: DC | PRN

## 2022-03-26 MED ORDER — SODIUM CHLORIDE 0.9 % IV SOLN: INTRAVENOUS | Status: DC

## 2022-03-26 MED ORDER — IOHEXOL 300 MG/ML  SOLN
100.0000 mL | Freq: Once | INTRAMUSCULAR | Status: AC | PRN
  Administered 2022-03-26: 100 mL via INTRAVENOUS

## 2022-03-26 MED ORDER — ACETAMINOPHEN 325 MG PO TABS
650.0000 mg | ORAL_TABLET | Freq: Four times a day (QID) | ORAL | Status: DC | PRN
  Administered 2022-03-26 – 2022-03-27 (×3): 650 mg via ORAL
  Filled 2022-03-26 (×3): qty 2

## 2022-03-26 MED ORDER — FENTANYL CITRATE PF 50 MCG/ML IJ SOSY
50.0000 ug | PREFILLED_SYRINGE | Freq: Once | INTRAMUSCULAR | Status: AC
  Administered 2022-03-26: 50 ug via INTRAVENOUS
  Filled 2022-03-26: qty 1

## 2022-03-26 NOTE — ED Notes (Signed)
Attempted to call report x 1  

## 2022-03-26 NOTE — ED Provider Notes (Signed)
Emergency Department Provider Note   I have reviewed the triage vital signs and the nursing notes.   HISTORY  Chief Complaint Abdominal Pain   HPI Kelli Thompson is a 57 y.o. female with past history of pancreatitis, hypertension, GERD presents to the emergency department with epigastric abdominal pain along with nausea.   {**SYMPTOM/COMPLAINT  LOCATION (describe anatomically) DURATION (when did it start) TIMING (onset and pattern) SEVERITY (0-10, mild/moderate/severe) QUALITY (description of symptoms) CONTEXT (recent surgery, new meds, activity, etc.) MODIFYINGFACTORS (what makes it better/worse) ASSOCIATEDSYMPTOMS (pertinent positives and negatives)**}  Past Medical History:  Diagnosis Date   Anxiety    Arthritis    Depression    GERD (gastroesophageal reflux disease)    Hypertension    Pancreatitis    around 2013; x1 secondary to lisinopril per patient   Peri-menopause 02/28/2014   Urinary frequency 01/30/2014   Urinary incontinence, mixed 01/30/2014    Review of Systems {** Revise as appropriate then delete this line - Documentation of 10 systems OR 2 systems and "10-point ROS otherwise negative" is required **}Constitutional: No fever/chills Eyes: No visual changes. ENT: No sore throat. Cardiovascular: Denies chest pain. Respiratory: Denies shortness of breath. Gastrointestinal: No abdominal pain.  No nausea, no vomiting.  No diarrhea.  No constipation. Genitourinary: Negative for dysuria. Musculoskeletal: Negative for back pain. Skin: Negative for rash. Neurological: Negative for headaches, focal weakness or numbness. {**Psychiatric:  Endocrine:  Hematological/Lymphatic:  Allergic/Immunilogical: **}  ____________________________________________   PHYSICAL EXAM:  VITAL SIGNS: ED Triage Vitals  Enc Vitals Group     BP 03/26/22 0856 (!) 151/104     Pulse Rate 03/26/22 0856 75     Resp 03/26/22 0856 17     Temp 03/26/22 0856 98.3 F (36.8 C)      Temp Source 03/26/22 0856 Oral     SpO2 03/26/22 0856 95 %     Weight 03/26/22 0846 245 lb (111.1 kg)     Height 03/26/22 0846 '5\' 5"'$  (1.651 m)     Head Circumference --      Peak Flow --      Pain Score 03/26/22 0846 8     Pain Loc --      Pain Edu? --      Excl. in [place]? --    {** Revise as appropriate then delete this line - 8 systems required **} Constitutional: Alert and oriented. Well appearing and in no acute distress. Eyes: Conjunctivae are normal. PERRL. EOMI. Head: Atraumatic. {**Ears:  Healthy appearing ear canals and TMs bilaterally **}Nose: No congestion/rhinnorhea. Mouth/Throat: Mucous membranes are moist.  Oropharynx non-erythematous. Neck: No stridor.  No meningeal signs.  {**No cervical spine tenderness to palpation.**} Cardiovascular: Normal rate, regular rhythm. Good peripheral circulation. Grossly normal heart sounds.   Respiratory: Normal respiratory effort.  No retractions. Lungs CTAB. Gastrointestinal: Soft and nontender. No distention.  {**Genitourinary:  **}Musculoskeletal: No lower extremity tenderness nor edema. No gross deformities of extremities. Neurologic:  Normal speech and language. No gross focal neurologic deficits are appreciated.  Skin:  Skin is warm, dry and intact. No rash noted. {**Psychiatric: Mood and affect are normal. Speech and behavior are normal.**}  ____________________________________________   LABS (all labs ordered are listed, but only abnormal results are displayed)  Labs Reviewed  COMPREHENSIVE METABOLIC PANEL  LIPASE, BLOOD  CBC WITH DIFFERENTIAL/PLATELET  URINALYSIS, ROUTINE W REFLEX MICROSCOPIC  TROPONIN I (HIGH SENSITIVITY)   ____________________________________________  EKG  *** ____________________________________________  RADIOLOGY  DG Chest 2 View  Result Date: 03/26/2022  CLINICAL DATA:  Shortness of breath. EXAM: CHEST - 2 VIEW COMPARISON:  None Available. FINDINGS: The heart size and mediastinal contours  are within normal limits. Both lungs are clear. The visualized skeletal structures are unremarkable. IMPRESSION: No active cardiopulmonary disease. Electronically Signed   By: Marijo Conception M.D.   On: 03/26/2022 10:05    ____________________________________________   PROCEDURES  Procedure(s) performed:   Procedures   ____________________________________________   INITIAL IMPRESSION / ASSESSMENT AND PLAN / ED COURSE  Pertinent labs & imaging results that were available during my care of the patient were reviewed by me and considered in my medical decision making (see chart for details).   This patient is Presenting for Evaluation of ***, which {Range:23949} require a range of treatment options, and {MDMcomplaint:23950} a complaint that involves a {MDMlevelrisk:23951} risk of morbidity and mortality.  The Differential Diagnoses include***.  Critical Interventions-    Medications - No data to display  Reassessment after intervention:     I *** Additional Historical Information from ***, as the patient is ***.  I decided to review pertinent External Data, and in summary ***.   Clinical Laboratory Tests Ordered, included   Radiologic Tests Ordered, included ***. I independently interpreted the images and agree with radiology interpretation.   Cardiac Monitor Tracing which shows ***   Social Determinants of Health Risk ***  Consult complete with  Medical Decision Making: Summary: ***  Reevaluation with update and discussion with   ***Considered admission***  Patient's presentation is most consistent with {EM COPA:27473}   Disposition:   ____________________________________________  FINAL CLINICAL IMPRESSION(S) / ED DIAGNOSES  Final diagnoses:  None     NEW OUTPATIENT MEDICATIONS STARTED DURING THIS VISIT:  New Prescriptions   No medications on file    Note:  This document was prepared using Dragon voice recognition software and may include  unintentional dictation errors.  Nanda Quinton, MD, [facility]

## 2022-03-26 NOTE — H&P (Signed)
History and Physical    Patient: Kelli Thompson [id number] DOB: 01-Sep-1965 DOA: 03/26/2022 DOS: the patient was seen and examined on 03/26/2022 PCP: Claretta Fraise, MD  Patient coming from: Home  Chief Complaint:  Chief Complaint  Patient presents with   Abdominal Pain   HPI:  57 year old woman PMH pancreatitis secondary to lisinopril, presented to the emergency department with a week and a half of epigastric abdominal pain.  Admitted for abdominal pain and CT showed pancreatitis.  Symptoms began about a week and a half ago, epigastric in nature, radiated to the back, extremely painful, currently 8/10, no specific aggravating or alleviating factors.  Poor appetite but she has been able to eat and no vomiting.  She has had pancreatitis once before secondary to lisinopril.  No alcohol.  Review of Systems: As mentioned in the history of present illness. Other systems reviewed and are negative. Past Medical History:  Diagnosis Date   Anxiety    Arthritis    Depression    GERD (gastroesophageal reflux disease)    Hypertension    Pancreatitis    around 2013; x1 secondary to lisinopril per patient   Peri-menopause 02/28/2014   Urinary frequency 01/30/2014   Urinary incontinence, mixed 01/30/2014   Past Surgical History:  Procedure Laterality Date   ABDOMINAL HYSTERECTOMY     APPENDECTOMY     BREAST BIOPSY Right 02/20/2020   Procedure: BREAST EXCISIONAL BIOPSY AFTER SEED LOCATION;  Surgeon: Virl Cagey, MD;  Location: [facility];  Service: General;  Laterality: Right;   cervical discectomy and fusion     CESAREAN SECTION     x2   CHOLECYSTECTOMY     COLONOSCOPY WITH PROPOFOL N/A 02/02/2020   Procedure: COLONOSCOPY WITH PROPOFOL;  Surgeon: Eloise Harman, DO;  Location: [facility] SUITE;  Service: Endoscopy;  Laterality: N/A;  8:15am   Social History:  reports that she quit smoking about 9 years ago. Her smoking use included cigarettes. She has a 15.00 pack-year smoking history.  She has never used smokeless tobacco. She reports that she does not drink alcohol and does not use drugs.  Allergies  Allergen Reactions   Ace Inhibitors     pancreatitis   Other     Any of the -prils, manly lisinopril    Family History  Problem Relation Age of Onset   Hypertension Mother    Diabetes Mother        borderline   Hypertension Father    Asthma Father    Stroke Father    Diabetes Maternal Grandmother    Congestive Heart Failure Maternal Grandmother    Arthritis Maternal Grandmother    Parkinson's disease Maternal Grandfather    Cancer Paternal Grandmother    Alcohol abuse Paternal Grandfather    Other Son        stomach issues   Colon cancer Neg Hx     Prior to Admission medications   Medication Sig Start Date End Date Taking? Authorizing Provider  amLODipine (NORVASC) 5 MG tablet Take 1 tablet (5 mg total) by mouth daily. TAKE ONE TABLET BY MOUTH DAILY FOR BLOOD PRESSURE 02/19/22   Claretta Fraise, MD  carvedilol (COREG) 12.5 MG tablet TAKE ONE TABLET BY MOUTH TWICE A DAY 02/19/22   Claretta Fraise, MD  DULoxetine (CYMBALTA) 60 MG capsule TAKE ONE CAPSULE BY MOUTH DAILY WITH SUPPER 02/19/22   Claretta Fraise, MD  famotidine (PEPCID) 20 MG tablet Take 1 tablet (20 mg total) by mouth 2 (two) times daily. (NEEDS TO  BE SEEN BEFORE NEXT REFILL) 02/26/21   Claretta Fraise, MD  loratadine (CLARITIN) 10 MG tablet Take 1 tablet (10 mg total) by mouth daily. 01/10/22   Schutt, Grafton Folk, PA-C  olmesartan-hydrochlorothiazide (BENICAR HCT) 40-25 MG tablet Take 1 tablet by mouth daily. For BP & Fluid 02/19/22   Claretta Fraise, MD  potassium chloride SA (KLOR-CON M20) 20 MEQ tablet TAKE ONE TABLET BY MOUTH DAILY FOR POTASSIUM 08/12/21   Claretta Fraise, MD    Physical Exam: Vitals:   03/26/22 0856 03/26/22 1145 03/26/22 1230 03/26/22 1600  BP: (!) 151/104 (!) 143/90 136/82 (!) 163/103  Pulse: 75 78 84 84  Resp: 17 (!) '24 15 19  '$ Temp: 98.3 F (36.8 C)  97.9 F (36.6 C) 97.9 F  (36.6 C)  TempSrc: Oral  Oral Oral  SpO2: 95% 95% 94% 90%  Weight:      Height:       Physical Exam Vitals reviewed.  Constitutional:      General: She is not in acute distress.    Appearance: She is ill-appearing. She is not toxic-appearing.  Cardiovascular:     Rate and Rhythm: Normal rate and regular rhythm.     Heart sounds: No murmur heard. Pulmonary:     Effort: Pulmonary effort is normal. No respiratory distress.     Breath sounds: No wheezing, rhonchi or rales.  Abdominal:     General: Abdomen is flat. There is no distension.     Palpations: Abdomen is soft.     Tenderness: There is no abdominal tenderness.  Musculoskeletal:     Right lower leg: No edema.     Left lower leg: No edema.  Neurological:     Mental Status: She is alert.  Psychiatric:        Mood and Affect: Mood normal.        Behavior: Behavior normal.     Data Reviewed: K+ 2.9 Remainder CMP unremarkable, troponins negative, CBC unremarkable, chest x-ray no acute disease, EKG sinus rhythm, no acute changes.  CT abdomen pelvis changes consistent with pancreatitis.  No complicating features noted.  Assessment and Plan: Acute severe abdominal pain, acute pancreatitis by imaging although lipase is normal.  Exam is benign.  No signs or symptoms of abdominal catastrophe.  Afebrile, hemodynamic stable, normal WBC. Treat as acute pancreatitis, n.p.o., fluids, pain control. Trend laboratory studies.  Hypokalemia Replete  Increasing sclerotic and mixed bone lesions.  Follow-up whole-body bone scan may be of some benefit further delineate in the outpatient setting.   Advance Care Planning: Full  Consults: none  Family Communication: none  Severity of Illness: The appropriate patient status for this patient is OBSERVATION. Observation status is judged to be reasonable and necessary in order to provide the required intensity of service to ensure the patient's safety. The patient's presenting symptoms,  physical exam findings, and initial radiographic and laboratory data in the context of their medical condition is felt to place them at decreased risk for further clinical deterioration. Furthermore, it is anticipated that the patient will be medically stable for discharge from the hospital within 2 midnights of admission.   Author: Murray Hodgkins, MD 03/26/2022 5:05 PM  For on call review [email or web address].

## 2022-03-26 NOTE — ED Notes (Signed)
Patient transported to CT 

## 2022-03-26 NOTE — ED Triage Notes (Signed)
Patient states she started with epigastric pain x1week ago that has progressively gotten worse. Now radiates to her left side. No N/V/D. No chest pain or SOB.

## 2022-03-27 ENCOUNTER — Encounter (HOSPITAL_COMMUNITY): Payer: Self-pay | Admitting: Family Medicine

## 2022-03-27 LAB — COMPREHENSIVE METABOLIC PANEL
ALT: 18 U/L (ref 0–44)
AST: 14 U/L — ABNORMAL LOW (ref 15–41)
Albumin: 3.4 g/dL — ABNORMAL LOW (ref 3.5–5.0)
Alkaline Phosphatase: 79 U/L (ref 38–126)
Anion gap: 7 (ref 5–15)
BUN: 13 mg/dL (ref 6–20)
CO2: 27 mmol/L (ref 22–32)
Calcium: 8.3 mg/dL — ABNORMAL LOW (ref 8.9–10.3)
Chloride: 101 mmol/L (ref 98–111)
Creatinine, Ser: 0.75 mg/dL (ref 0.44–1.00)
GFR, Estimated: >60 mL/min (ref >60)
Glucose, Bld: 134 mg/dL — ABNORMAL HIGH (ref 70–99)
Potassium: 3.5 mmol/L (ref 3.5–5.1)
Sodium: 135 mmol/L (ref 135–145)
Total Bilirubin: 0.8 mg/dL (ref 0.3–1.2)
Total Protein: 6.6 g/dL (ref 6.5–8.1)

## 2022-03-27 LAB — URINALYSIS, ROUTINE W REFLEX MICROSCOPIC
Bilirubin Urine: NEGATIVE
Glucose, UA: NEGATIVE mg/dL
Hgb urine dipstick: NEGATIVE
Ketones, ur: NEGATIVE mg/dL
Leukocytes,Ua: NEGATIVE
Nitrite: NEGATIVE
Protein, ur: NEGATIVE mg/dL
Specific Gravity, Urine: 1.012 (ref 1.005–1.030)
pH: 6 (ref 5.0–8.0)

## 2022-03-27 LAB — CBC
HCT: 37.5 % (ref 36.0–46.0)
Hemoglobin: 12.2 g/dL (ref 12.0–15.0)
MCH: 27.7 pg (ref 26.0–34.0)
MCHC: 32.5 g/dL (ref 30.0–36.0)
MCV: 85.2 fL (ref 80.0–100.0)
Platelets: 144 10*3/uL — ABNORMAL LOW (ref 150–400)
RBC: 4.4 MIL/uL (ref 3.87–5.11)
RDW: 14.5 % (ref 11.5–15.5)
WBC: 9.5 10*3/uL (ref 4.0–10.5)
nRBC: 0 % (ref 0.0–0.2)

## 2022-03-27 LAB — TRIGLYCERIDES: Triglycerides: 50 mg/dL (ref <150)

## 2022-03-27 LAB — HIV ANTIBODY (ROUTINE TESTING W REFLEX): HIV Screen 4th Generation wRfx: NONREACTIVE

## 2022-03-27 NOTE — Progress Notes (Signed)
  Transition of Care [facility]) Screening Note   Patient Details  Name: ROZLIN TREZISE Date of Birth: [birth year]   Transition of Care [facility]) CM/SW Contact:    Ihor Gully, LCSW Phone Number: 03/27/2022, 2:05 PM    Transition of Care Department [facility]) has reviewed patient and no TOC needs have been identified at this time. We will continue to monitor patient advancement through interdisciplinary progression rounds. If new patient transition needs arise, please place a TOC consult.

## 2022-03-27 NOTE — Consult Note (Addendum)
Gastroenterology Consult   Referring Provider: No ref. provider found Primary Care Physician:  Claretta Fraise, MD Primary Gastroenterologist:  Elon Alas. Abbey Chatters, DO   Patient ID: TANGE MONAHAN; [id number]; 1965-02-24   Admit date: 03/26/2022  LOS: 0 days   Date of Consultation: 03/27/2022  Reason for Consultation:  abdominal pain, pancreatitis    History of Present Illness   Kelli Thompson is a 57 y.o. female with GERD, HTN, Anxiety/depression, h/o pancreatitis possibly due to lisinopril/hctz in 2015 presented to the ED with 10 days of epigastric pain.   In the ED: Sodium 134, potassium 2.9, glucose 148, creatinine 0.65, albumin 3.9, AST 14, ALT 20, alkaline phosphatase 94, total bilirubin 0.9, lipase 35, white blood cell count 9700, hemoglobin 12.7, platelets 207,000.  CT yesterday with changes consistent with pancreatitis with stranding along the pancreatic body and head and towards duodenum.  No complicating features at this time.  Alternate differential would include duodenitis.  Mild splenic enlargement.  Increasing sclerotic and mixed bone lesions noted compared to 2015 CT.  Patient reports onset of back pain about 10 days ago. At first she thought it was muscle related. Then noted abdominal pain with it. She has had some cold like symptoms during this time as well. Taking ibuprofen for back pain and cold symptoms during this time, does not routinely take nsaids. Appetite gradually becoming poor over past week. No n/v. No melena, brbpr. At this time, pain predominantly in epigastric region, band like and radiates around into the back and some lower abdominal pain in both lower quadrants. Back pain is the most severe.   She reports no etoh. On 02/19/22, her furosemide was stopped along with olmesartan and she was placed on olmesartan-HCTZ. In 2015, there was concern that patient's lisinopril/HCTZ at that time was cause of pancreatitis.      Prior to Admission medications    Medication Sig Start Date End Date Taking? Authorizing Provider  amLODipine (NORVASC) 5 MG tablet Take 1 tablet (5 mg total) by mouth daily. TAKE ONE TABLET BY MOUTH DAILY FOR BLOOD PRESSURE 02/19/22  Yes Stacks, Cletus Gash, MD  carvedilol (COREG) 12.5 MG tablet TAKE ONE TABLET BY MOUTH TWICE A DAY 02/19/22  Yes Stacks, Cletus Gash, MD  DULoxetine (CYMBALTA) 60 MG capsule TAKE ONE CAPSULE BY MOUTH DAILY WITH SUPPER 02/19/22  Yes Stacks, Cletus Gash, MD  famotidine (PEPCID) 20 MG tablet Take 1 tablet (20 mg total) by mouth 2 (two) times daily. (NEEDS TO BE SEEN BEFORE NEXT REFILL) 02/26/21  Yes Stacks, Cletus Gash, MD  olmesartan-hydrochlorothiazide (BENICAR HCT) 40-25 MG tablet Take 1 tablet by mouth daily. For BP & Fluid 02/19/22  Yes Stacks, Cletus Gash, MD  potassium chloride SA (KLOR-CON M20) 20 MEQ tablet TAKE ONE TABLET BY MOUTH DAILY FOR POTASSIUM 08/12/21  Yes Stacks, Cletus Gash, MD  loratadine (CLARITIN) 10 MG tablet Take 1 tablet (10 mg total) by mouth daily. Patient not taking: Reported on 03/26/2022 01/10/22   Roylene Reason, PA-C    Current Facility-Administered Medications  Medication Dose Route Frequency Provider Last Rate Last Admin   0.9 %  sodium chloride infusion   Intravenous Continuous Samuella Cota, MD 250 mL/hr at 03/27/22 0457 New Bag at 03/27/22 0457   acetaminophen (TYLENOL) tablet 650 mg  650 mg Oral Q6H PRN Samuella Cota, MD   650 mg at 03/27/22 0827   Or   acetaminophen (TYLENOL) suppository 650 mg  650 mg Rectal Q6H PRN Samuella Cota, MD  carvedilol (COREG) tablet 12.5 mg  12.5 mg Oral BID WC Samuella Cota, MD   12.5 mg at 03/27/22 [id number]   HYDROmorphone (DILAUDID) injection 0.5-1 mg  0.5-1 mg Intravenous Q2H PRN Samuella Cota, MD   0.5 mg at 03/27/22 0357   ondansetron (ZOFRAN) tablet 4 mg  4 mg Oral Q6H PRN Samuella Cota, MD       Or   ondansetron [facility]) injection 4 mg  4 mg Intravenous Q6H PRN Samuella Cota, MD       pantoprazole (PROTONIX) injection  40 mg  40 mg Intravenous Q12H Samuella Cota, MD   40 mg at 03/27/22 0827   sodium chloride flush (NS) 0.9 % injection 3 mL  3 mL Intravenous Q12H Samuella Cota, MD   3 mL at 03/27/22 [id number]    Allergies as of 03/26/2022 - Review Complete 03/26/2022  Allergen Reaction Noted   Ace inhibitors  03/28/2015   Other  12/12/2020    Past Medical History:  Diagnosis Date   Anxiety    Arthritis    Depression    GERD (gastroesophageal reflux disease)    Hypertension    Pancreatitis    around 2013; x1 secondary to lisinopril per patient   Peri-menopause 02/28/2014   Urinary frequency 01/30/2014   Urinary incontinence, mixed 01/30/2014    Past Surgical History:  Procedure Laterality Date   ABDOMINAL HYSTERECTOMY     APPENDECTOMY     BREAST BIOPSY Right 02/20/2020   Procedure: BREAST EXCISIONAL BIOPSY AFTER SEED LOCATION;  Surgeon: Virl Cagey, MD;  Location: [facility];  Service: General;  Laterality: Right;   cervical discectomy and fusion     CESAREAN SECTION     x2   CHOLECYSTECTOMY     COLONOSCOPY WITH PROPOFOL N/A 02/02/2020   Procedure: COLONOSCOPY WITH PROPOFOL;  Surgeon: Eloise Harman, DO;  Location: [facility];  Service: Endoscopy;  Laterality: N/A;  8:15am   NECK SURGERY      Family History  Problem Relation Age of Onset   Hypertension Mother    Diabetes Mother        borderline   Hypertension Father    Asthma Father    Stroke Father    Diabetes Maternal Grandmother    Congestive Heart Failure Maternal Grandmother    Arthritis Maternal Grandmother    Parkinson's disease Maternal Grandfather    Cancer Paternal Grandmother    Alcohol abuse Paternal Grandfather    Other Son        stomach issues   Colon cancer Neg Hx     Social History   Socioeconomic History   Marital status: Married    Spouse name: Not on file   Number of children: Not on file   Years of education: Not on file   Highest education level: Not on file  Occupational History   Not  on file  Tobacco Use   Smoking status: Former    Packs/day: 0.50    Years: 30.00    Total pack years: 15.00    Types: Cigarettes    Quit date: 03/27/2013    Years since quitting: 9.0   Smokeless tobacco: Never  Vaping Use   Vaping Use: Never used  Substance and Sexual Activity   Alcohol use: No   Drug use: No   Sexual activity: Yes    Birth control/protection: Surgical    Comment: hyst  Other Topics Concern   Not on file  Social History Narrative  Not on file   Social Determinants of Health   Financial Resource Strain: Not on file  Food Insecurity: No Food Insecurity (03/26/2022)   Hunger Vital Sign    Worried About Running Out of Food in the Last Year: Never true    Ran Out of Food in the Last Year: Never true  Transportation Needs: No Transportation Needs (03/26/2022)   PRAPARE - Hydrologist (Medical): No    Lack of Transportation (Non-Medical): No  Physical Activity: Not on file  Stress: Not on file  Social Connections: Not on file  Intimate Partner Violence: Not At Risk (03/26/2022)   Humiliation, Afraid, Rape, and Kick questionnaire    Fear of Current or Ex-Partner: No    Emotionally Abused: No    Physically Abused: No    Sexually Abused: No     Review of System:   General: Negative for anorexia, weight loss, fever, chills, fatigue, weakness. Eyes: Negative for vision changes.  ENT: Negative for hoarseness, difficulty swallowing , nasal congestion. CV: Negative for chest pain, angina, palpitations, dyspnea on exertion, +peripheral edema.  Respiratory: Negative for dyspnea at rest, dyspnea on exertion, cough, sputum, wheezing.  GI: See history of present illness. GU:  Negative for dysuria, hematuria, urinary incontinence, urinary frequency, nocturnal urination.  MS: Negative for joint pain, low back pain. +mid back pain Derm: Negative for rash or itching.  Neuro: Negative for weakness, abnormal sensation, seizure, frequent  headaches, memory loss, confusion.  Psych: Negative for anxiety, depression, suicidal ideation, hallucinations.  Endo: Negative for unusual weight change.  Heme: Negative for bruising or bleeding. Allergy: Negative for rash or hives.      Physical Examination:   Vital signs in last 24 hours: Temp:  [97.7 F (36.5 C)-98.3 F (36.8 C)] 98 F (36.7 C) (02/29 0448) Pulse Rate:  [72-90] 84 (02/29 0826) Resp:  [15-24] 16 (02/29 0448) BP: (124-163)/(75-103) 133/78 (02/29 0826) SpO2:  [90 %-95 %] 92 % (02/29 0448) Weight:  [142.9 kg] 142.9 kg (02/28 1844) Last BM Date : 03/25/22  General: Well-nourished, well-developed in no acute distress.  Head: Normocephalic, atraumatic.   Eyes: Conjunctiva pink, no icterus. Mouth: Oropharyngeal mucosa moist and pink , no lesions erythema or exudate. Neck: Supple without thyromegaly, masses, or lymphadenopathy.  Lungs: Clear to auscultation bilaterally.  Heart: Regular rate and rhythm, no murmurs rubs or gallops.  Abdomen: Bowel sounds are normal,  nondistended, no hepatosplenomegaly or masses, no abdominal bruits or hernia , no rebound or guarding.  Moderate epigastric tenderness Rectal: not performed Extremities: No lower extremity edema, clubbing, deformity.  Neuro: Alert and oriented x 4 , grossly normal neurologically.  Skin: Warm and dry, no rash or jaundice.   Psych: Alert and cooperative, normal mood and affect.        Intake/Output from previous day: 02/28 0701 - 02/29 0700 In: 651.5 [I.V.:50; IV Piggyback:601.5] Out: -  Intake/Output this shift: No intake/output data recorded.  Lab Results:   CBC Recent Labs    03/26/22 1100 03/27/22 0445  WBC 9.7 9.5  HGB 12.7 12.2  HCT 38.4 37.5  MCV 82.2 85.2  PLT 207 144*   BMET Recent Labs    03/26/22 1100 03/27/22 0445  NA 134* 135  K 2.9* 3.5  CL 99 101  CO2 27 27  GLUCOSE 148* 134*  BUN 14 13  CREATININE 0.65 0.75  CALCIUM 8.7* 8.3*   LFT Recent Labs    03/26/22 1100  03/27/22 0445  BILITOT  0.9 0.8  ALKPHOS 94 79  AST 14* 14*  ALT 20 18  PROT 7.3 6.6  ALBUMIN 3.9 3.4*    Lipase Recent Labs    03/26/22 1100  LIPASE 35    PT/INR No results for input(s): "LABPROT", "INR" in the last 72 hours.   Hepatitis Panel No results for input(s): "HEPBSAG", "HCVAB", "HEPAIGM", "HEPBIGM" in the last 72 hours.   Imaging Studies:   CT ABDOMEN PELVIS W CONTRAST  Result Date: 03/26/2022 CLINICAL DATA:  Acute abdominal pain EXAM: CT ABDOMEN AND PELVIS WITH CONTRAST TECHNIQUE: Multidetector CT imaging of the abdomen and pelvis was performed using the standard protocol following bolus administration of intravenous contrast. RADIATION DOSE REDUCTION: This exam was performed according to the departmental dose-optimization program which includes automated exposure control, adjustment of the mA and/or kV according to patient size and/or use of iterative reconstruction technique. CONTRAST:  142m OMNIPAQUE IOHEXOL 300 MG/ML  SOLN COMPARISON:  04/17/2013 CT. FINDINGS: Lower chest: There is some linear opacity lung bases likely scar for atelectasis. Mild motion at the lung bases. Hepatobiliary: Nonspecific periportal edema identified. No space-occupying liver lesion. Previous cholecystectomy. Patent portal vein. Pancreas: There is stranding along the head and neck of the pancreas extending into the central mesentery and some thickening extending along the right anterior perinephric space. There is also stranding around the second and proximal third portion of the duodenal. The pancreas itself is diffusely fatty replaced. No well-defined fluid collections or areas of poor enhancement. Spleen: Spleen has a longitudinal length of 14.3 cm. Preserved enhancement. Splenic enlargement. Small splenule. Adrenals/Urinary Tract: Adrenal glands are preserved. No renal collecting system dilatation. There are some tiny cystic foci in the right kidney which are under a cm but has a appearance of  Bosniak 1 cysts. No specific imaging follow-up. Largest is in the upper pole of the right measuring 7 mm. There is a low-attenuation lesion on the upper pole left kidney which is slightly larger at 13 mm but again has a appearance of benign Bosniak 1 cysts. No specific imaging follow-up. The ureters have normal course and caliber down to the bladder. The bladder is underdistended. Stomach/Bowel: Few sigmoid colon diverticula. Mild colonic stool. Large bowel overall has a normal course and caliber. The appendix is not clearly seen in the right lower quadrant but there may be some surgical changes along the base of the cecum. Stomach is underdistended. Small bowel is nondilated. Vascular/Lymphatic: Normal caliber aorta and IVC with mild atherosclerotic calcified plaque. There are several small less than 1 cm size in upper abdominal and retroperitoneal nodes, not pathologic by size criteria. These could be reactive. Reproductive: Status post hysterectomy. No adnexal masses. Other: No free intra-abdominal air identified.  No frank ascites. Musculoskeletal: Scattered degenerative changes identified of the spine and pelvis. Once again there are multiple sclerotic lesions identified. Dominant foci seen left iliac bone, right upper hemi sacrum. These have been present since at least 2015. There are increasing lesions identified however such as posterior left acetabulum on image 81 measuring 8 mm today and previously 2-3 mm. Please correlate with prior workup. Also increasing lesion in the L2 vertebral body which is more mixed lucent and sclerotic. IMPRESSION: Changes consistent of pancreatitis with stranding along the pancreatic body and head and towards the duodenal. No complicating features at this time. No vessel occlusion, enhancing fluid collection or areas of pancreatic necrosis. Alternative differential would include duodenitis. Mild splenic enlargement. Increasing sclerotic and mixed bone lesions. Please correlate  for any recent  cancer diagnosis. With change a follow-up whole-body bone scan may be of some benefit further delineate. Electronically Signed   By: Jill Side M.D.   On: 03/26/2022 12:55   DG Chest 2 View  Result Date: 03/26/2022 CLINICAL DATA:  Shortness of breath. EXAM: CHEST - 2 VIEW COMPARISON:  None Available. FINDINGS: The heart size and mediastinal contours are within normal limits. Both lungs are clear. The visualized skeletal structures are unremarkable. IMPRESSION: No active cardiopulmonary disease. Electronically Signed   By: Marijo Conception M.D.   On: 03/26/2022 10:05  [4 week]  Assessment:   57 y/o female with history of pancreatitis in 2015, thought to be due to lisinopril/HCTZ (class III drug associated with pancreatitis) presents with 10 days history of abdominal pain/back pain with CT findings suggestive of uncomplicated pancreatitis.   Uncomplicated pancreatitis: presented 10 days into onset of symptoms. Likely reason why lipase is normal. Less likely symptoms due to primary duodenitis. Similar episode in 2015, limited work up at that time and it was thought her symptoms may be secondary to lisinopril/HCTZ at that time and the medication was stopped. Recently started back on HCTZ 02/19/22, could potentially be cause of her pancreatitis but need to consider other etiologies. No evidence of high calcium. No etoh use.   Bone lesions: history of left iliac sclerotic lesion dating back to 2011, larger in 2015. Bone scan 2015 with no evidence of neoplastic disease to the bone. Current CT with increasing sclerotic and mixed bone lesions with no known history of cancer.   Plan:   Check triglycerides, IgG 4.  Consider stopping HCTZ.  Consider MRI Abd/MRCP in 12 weeks to rule out underlying pancreatic lesions/stones/pancreas divisum.  PPI BID. Clear liquids Further work up of bone lesions per attending.    LOS: 0 days   We would like to thank you for the opportunity to participate  in the care of [place].  Laureen Ochs. Bernarda Caffey [facility] [phone] 2/29/20249:58 AM

## 2022-03-27 NOTE — Hospital Course (Signed)
57 year old woman PMH pancreatitis secondary to lisinopril, presented to the emergency department with a week and a half of epigastric abdominal pain. Admitted for abdominal pain and CT showed pancreatitis.

## 2022-03-27 NOTE — Progress Notes (Signed)
Patient c/o abdominal pain and headache throughout night. Educated patient that narcotics can cause / worsen headache. Patient verbalized understanding PRN tylenol and dilaudid given as ordered for pain.

## 2022-03-27 NOTE — Progress Notes (Signed)
  Progress Note   Patient: Kelli Thompson [id number] DOB: Jun 05, 1965 DOA: 03/26/2022     0 DOS: the patient was seen and examined on 03/27/2022   Brief hospital course: 57 year old woman PMH pancreatitis secondary to lisinopril, presented to the emergency department with a week and a half of epigastric abdominal pain. Admitted for abdominal pain and CT showed pancreatitis.   Assessment and Plan: Acute severe abdominal pain, acute pancreatitis by imaging although lipase is normal.  Exam is benign.  No signs or symptoms of abdominal catastrophe.  Afebrile, hemodynamic stable, normal WBC. Clinically improved today.  Will continue to treat his pancreatitis.  Fluids. Appreciate GI involvement.  Will stop hydrochlorothiazide. Outpatient MRI Abd/MRCP in 12 weeks to rule out underlying pancreatic lesions/stones/pancreas divisum  Twice daily PPI Clear liquids   Hypokalemia Repleted   Increasing sclerotic and mixed bone lesions.  Follow-up whole-body bone scan may be of some benefit further delineate in the outpatient setting. Follow-up as an outpatient.      Subjective:  Feels better but still has abdominal pain, though less Starting to feel a little hungry  Physical Exam: Vitals:   03/27/22 0019 03/27/22 0448 03/27/22 0826 03/27/22 1315  BP: (!) 141/81 124/75 133/78 137/88  Pulse: 72 77 84 83  Resp: 16 16  18  $ Temp: 98.1 F (36.7 C) 98 F (36.7 C)  98.9 F (37.2 C)  TempSrc: Oral Oral  Oral  SpO2: 92% 92%  94%  Weight:      Height:       Physical Exam Vitals reviewed.  Constitutional:      General: She is not in acute distress.    Appearance: She is not ill-appearing or toxic-appearing.  Cardiovascular:     Rate and Rhythm: Normal rate and regular rhythm.     Heart sounds: No murmur heard. Pulmonary:     Effort: Pulmonary effort is normal. No respiratory distress.     Breath sounds: No wheezing, rhonchi or rales.  Abdominal:     General: There is no distension.      Palpations: Abdomen is soft.     Tenderness: There is no abdominal tenderness.  Neurological:     Mental Status: She is alert.  Psychiatric:        Mood and Affect: Mood normal.        Behavior: Behavior normal.     Data Reviewed: CMP noted TG 50 Plts 144 LFTs normal  Family Communication: none  Disposition: Status is: Inpatient   Planned Discharge Destination: Home    Time spent: 25 minutes  Author: Murray Hodgkins, MD 03/27/2022 2:20 PM  For on call review [email or web address].

## 2022-03-28 DIAGNOSIS — K858 Other acute pancreatitis without necrosis or infection: Secondary | ICD-10-CM

## 2022-03-28 LAB — COMPREHENSIVE METABOLIC PANEL
ALT: 18 U/L (ref 0–44)
AST: 13 U/L — ABNORMAL LOW (ref 15–41)
Albumin: 3.2 g/dL — ABNORMAL LOW (ref 3.5–5.0)
Alkaline Phosphatase: 74 U/L (ref 38–126)
Anion gap: 7 (ref 5–15)
BUN: 9 mg/dL (ref 6–20)
CO2: 27 mmol/L (ref 22–32)
Calcium: 8 mg/dL — ABNORMAL LOW (ref 8.9–10.3)
Chloride: 103 mmol/L (ref 98–111)
Creatinine, Ser: 0.68 mg/dL (ref 0.44–1.00)
GFR, Estimated: >60 mL/min (ref >60)
Glucose, Bld: 117 mg/dL — ABNORMAL HIGH (ref 70–99)
Potassium: 2.8 mmol/L — ABNORMAL LOW (ref 3.5–5.1)
Sodium: 137 mmol/L (ref 135–145)
Total Bilirubin: 0.7 mg/dL (ref 0.3–1.2)
Total Protein: 6.2 g/dL — ABNORMAL LOW (ref 6.5–8.1)

## 2022-03-28 LAB — IGG 4: IgG, Subclass 4: 11 mg/dL (ref 2–96)

## 2022-03-28 LAB — MAGNESIUM: Magnesium: 1.8 mg/dL (ref 1.7–2.4)

## 2022-03-28 MED ORDER — MAGNESIUM SULFATE 2 GM/50ML IV SOLN
2.0000 g | Freq: Once | INTRAVENOUS | Status: AC
  Administered 2022-03-28: 2 g via INTRAVENOUS
  Filled 2022-03-28: qty 50

## 2022-03-28 MED ORDER — HYDROMORPHONE HCL 1 MG/ML IJ SOLN: 0.5000 mg | INTRAMUSCULAR | Status: DC | PRN

## 2022-03-28 MED ORDER — POTASSIUM CHLORIDE CRYS ER 20 MEQ PO TBCR
40.0000 meq | EXTENDED_RELEASE_TABLET | ORAL | Status: AC
  Administered 2022-03-28 (×2): 40 meq via ORAL
  Filled 2022-03-28 (×2): qty 2

## 2022-03-28 MED ORDER — HYDROCODONE-ACETAMINOPHEN 5-325 MG PO TABS: 1.0000 | ORAL_TABLET | Freq: Four times a day (QID) | ORAL | Status: DC | PRN

## 2022-03-28 NOTE — Consult Note (Signed)
Gastroenterology Progress Note   Referring Provider: No ref. provider found Primary Care Physician:  Claretta Fraise, MD Primary Gastroenterologist:  Elon Alas. Abbey Chatters, DO   Patient ID: Kelli Thompson; [id number]; 1965/12/23    Subjective   No N/V. No dysphagia. Still with some abdominal pain but rare use of pain medication. Has been walking laps and sitting in the chair. Tolerating clears and feeling more hungry but slightly scared to eat. Would like to try advancing her diet.    Objective   Vital signs in last 24 hours Temp:  [98.8 F (37.1 C)-98.9 F (37.2 C)] 98.8 F (37.1 C) (03/01 0504) Pulse Rate:  [71-83] 74 (03/01 0504) Resp:  [16-18] 18 (03/01 0504) BP: (125-156)/(76-88) 156/88 (03/01 0504) SpO2:  [94 %-96 %] 96 % (03/01 0504) Last BM Date : 03/25/22  Physical Exam General:   Alert and oriented, pleasant Head:  Normocephalic and atraumatic. Eyes:  No icterus, sclera clear. Conjuctiva pink.  Mouth:  Without lesions, mucosa pink and moist.  Neck:  Supple, without thyromegaly or masses.  Heart:  S1, S2 present, no murmurs noted.  Lungs: Clear to auscultation bilaterally, without wheezing, rales, or rhonchi.  Abdomen:  Bowel sounds present, soft, non-distended. Mild ttp to RUQ and epigastrium, mild tenderness to left back. No HSM or hernias noted. No rebound or guarding. No masses appreciated  Extremities:  Without clubbing or edema. Neurologic:  Alert and  oriented x4;  grossly normal neurologically. Psych:  Alert and cooperative. Normal mood and affect.  Intake/Output from previous day: 02/29 0701 - 03/01 0700 In: 4129.9 [P.O.:720; I.V.:3409.9] Out: -  Intake/Output this shift: No intake/output data recorded.  Lab Results  Recent Labs    03/26/22 1100 03/27/22 0445  WBC 9.7 9.5  HGB 12.7 12.2  HCT 38.4 37.5  PLT 207 144*   BMET Recent Labs    03/26/22 1100 03/27/22 0445 03/28/22 0437  NA 134* 135 137  K 2.9* 3.5 2.8*  CL 99 101 103  CO2  '27 27 27  '$ GLUCOSE 148* 134* 117*  BUN '14 13 9  '$ CREATININE 0.65 0.75 0.68  CALCIUM 8.7* 8.3* 8.0*   LFT Recent Labs    03/26/22 1100 03/27/22 0445 03/28/22 0437  PROT 7.3 6.6 6.2*  ALBUMIN 3.9 3.4* 3.2*  AST 14* 14* 13*  ALT '20 18 18  '$ ALKPHOS 94 79 74  BILITOT 0.9 0.8 0.7   PT/INR No results for input(s): "LABPROT", "INR" in the last 72 hours. Hepatitis Panel No results for input(s): "HEPBSAG", "HCVAB", "HEPAIGM", "HEPBIGM" in the last 72 hours.  Studies/Results CT ABDOMEN PELVIS W CONTRAST  Result Date: 03/26/2022 CLINICAL DATA:  Acute abdominal pain EXAM: CT ABDOMEN AND PELVIS WITH CONTRAST TECHNIQUE: Multidetector CT imaging of the abdomen and pelvis was performed using the standard protocol following bolus administration of intravenous contrast. RADIATION DOSE REDUCTION: This exam was performed according to the departmental dose-optimization program which includes automated exposure control, adjustment of the mA and/or kV according to patient size and/or use of iterative reconstruction technique. CONTRAST:  173m OMNIPAQUE IOHEXOL 300 MG/ML  SOLN COMPARISON:  04/17/2013 CT. FINDINGS: Lower chest: There is some linear opacity lung bases likely scar for atelectasis. Mild motion at the lung bases. Hepatobiliary: Nonspecific periportal edema identified. No space-occupying liver lesion. Previous cholecystectomy. Patent portal vein. Pancreas: There is stranding along the head and neck of the pancreas extending into the central mesentery and some thickening extending along the right anterior perinephric space. There is also stranding around the  second and proximal third portion of the duodenal. The pancreas itself is diffusely fatty replaced. No well-defined fluid collections or areas of poor enhancement. Spleen: Spleen has a longitudinal length of 14.3 cm. Preserved enhancement. Splenic enlargement. Small splenule. Adrenals/Urinary Tract: Adrenal glands are preserved. No renal collecting  system dilatation. There are some tiny cystic foci in the right kidney which are under a cm but has a appearance of Bosniak 1 cysts. No specific imaging follow-up. Largest is in the upper pole of the right measuring 7 mm. There is a low-attenuation lesion on the upper pole left kidney which is slightly larger at 13 mm but again has a appearance of benign Bosniak 1 cysts. No specific imaging follow-up. The ureters have normal course and caliber down to the bladder. The bladder is underdistended. Stomach/Bowel: Few sigmoid colon diverticula. Mild colonic stool. Large bowel overall has a normal course and caliber. The appendix is not clearly seen in the right lower quadrant but there may be some surgical changes along the base of the cecum. Stomach is underdistended. Small bowel is nondilated. Vascular/Lymphatic: Normal caliber aorta and IVC with mild atherosclerotic calcified plaque. There are several small less than 1 cm size in upper abdominal and retroperitoneal nodes, not pathologic by size criteria. These could be reactive. Reproductive: Status post hysterectomy. No adnexal masses. Other: No free intra-abdominal air identified.  No frank ascites. Musculoskeletal: Scattered degenerative changes identified of the spine and pelvis. Once again there are multiple sclerotic lesions identified. Dominant foci seen left iliac bone, right upper hemi sacrum. These have been present since at least 2015. There are increasing lesions identified however such as posterior left acetabulum on image 81 measuring 8 mm today and previously 2-3 mm. Please correlate with prior workup. Also increasing lesion in the L2 vertebral body which is more mixed lucent and sclerotic. IMPRESSION: Changes consistent of pancreatitis with stranding along the pancreatic body and head and towards the duodenal. No complicating features at this time. No vessel occlusion, enhancing fluid collection or areas of pancreatic necrosis. Alternative differential  would include duodenitis. Mild splenic enlargement. Increasing sclerotic and mixed bone lesions. Please correlate for any recent cancer diagnosis. With change a follow-up whole-body bone scan may be of some benefit further delineate. Electronically Signed   By: Jill Side M.D.   On: 03/26/2022 12:55   DG Chest 2 View  Result Date: 03/26/2022 CLINICAL DATA:  Shortness of breath. EXAM: CHEST - 2 VIEW COMPARISON:  None Available. FINDINGS: The heart size and mediastinal contours are within normal limits. Both lungs are clear. The visualized skeletal structures are unremarkable. IMPRESSION: No active cardiopulmonary disease. Electronically Signed   By: Marijo Conception M.D.   On: 03/26/2022 10:05    Assessment  57 y.o. female with a history of pancreatitis in 2015 thought to be secondary to lisinopril/HCTZ who presented who the ED with 10 day history of abdominal and back pain and CT findings suggestive uncomplicated pancreatitis.  Uncomplicated pancreatitis: 10 days of symptoms prior to arrival. Lipase normal on admission as levels likely decreased given timing from onset to presentation. Symptoms less likely secondary to duodenitis. Similar episode in 2015 with limited workup and felt to be related to lisinopril/HCTZ and medication was stopped. In January she was restarted on HCTZ and this likely could be culprit again given triglycerides normal at 50 and normal calcium levels. No alcohol use. IgG4 pending. For additional evaluation she will need MRI/MRCP to assess for malignancy, stones, and pancreas divisum. Some improvement in  pain, infrequent pain medication use. No N/V. Having some occasional looser stools. Would like to try advancing her diet which is reasonable. Will do soft GI diet, low fat.   Bone lesions: Left iliac sclerotic lesion in 2011 with growth in 2015. Bone scan in 2015 with evidence of neoplastic disease to the bone. Current CT with increasing sclerotic and mixed bone lesion without  history of cancer. Further workup per hospitalist.   Plan / Recommendations  Follow up IgG4 Continue to hold HCTZ PPI BID MRI/MRCP in 12 weeks Continue supportive measures Diet  - advance to GI soft, low fat    LOS: 1 day    03/28/2022, 8:57 AM   Venetia Night, MSN, FNP-BC, AGACNP-BC [facility]

## 2022-03-28 NOTE — Plan of Care (Signed)

## 2022-03-28 NOTE — Progress Notes (Signed)
  Progress Note   Patient: FONDA MONCRIEF [id number] DOB: 10-06-1965 DOA: 03/26/2022     1 DOS: the patient was seen and examined on 03/28/2022   Brief hospital course: 57 year old woman PMH pancreatitis secondary to lisinopril, presented to the emergency department with a week and a half of epigastric abdominal pain. Admitted for abdominal pain and CT showed pancreatitis.   Assessment and Plan: Acute severe abdominal pain, acute pancreatitis by imaging although lipase was normal.  Exam is benign.  No signs or symptoms of abdominal catastrophe.  Afebrile, hemodynamic stable, normal WBC. Clinically improving.  Will continue to treat pancreatitis. Appreciate GI involvement.  Stopped hydrochlorothiazide. Outpatient MRI Abd/MRCP in 12 weeks to rule out underlying pancreatic lesions/stones/pancreas divisum  Twice daily PPI Diet advanced to soft. Can likely go home 3/2.   Hypokalemia Replete Mg2+ borderline, will replete   Increasing sclerotic and mixed bone lesions.  Follow-up whole-body bone scan may be of some benefit further delineate in the outpatient setting. Follow-up as an outpatient. Discussed with patient. She is aware. Will need to bring to PCP attention in discharge summary.       Subjective:  Feels better Tolerating liquids Still has some pain  Physical Exam: Vitals:   03/27/22 1315 03/27/22 2138 03/28/22 0504 03/28/22 1205  BP: 137/88 125/76 (!) 156/88 (!) 156/90  Pulse: 83 71 74 77  Resp: '18 16 18 16  '$ Temp: 98.9 F (37.2 C) 98.8 F (37.1 C) 98.8 F (37.1 C) 98.6 F (37 C)  TempSrc: Oral Oral Oral Oral  SpO2: 94% 94% 96% 95%  Weight:      Height:       Physical Exam Vitals reviewed.  Constitutional:      General: She is not in acute distress.    Appearance: She is not ill-appearing or toxic-appearing.  Cardiovascular:     Rate and Rhythm: Normal rate and regular rhythm.     Heart sounds: No murmur heard. Pulmonary:     Effort: Pulmonary effort is  normal. No respiratory distress.     Breath sounds: No wheezing, rhonchi or rales.  Abdominal:     General: There is no distension.     Palpations: Abdomen is soft.  Neurological:     Mental Status: She is alert.  Psychiatric:        Mood and Affect: Mood normal.        Behavior: Behavior normal.     Data Reviewed: K+ 2.8 Mg2+ 1.8  Family Communication: none  Disposition: Status is: Inpatient Remains inpatient appropriate because: abdominal pain  Planned Discharge Destination: Home    Time spent: 20 minutes  Author: Murray Hodgkins, MD 03/28/2022 3:31 PM  For on call review [email or web address].

## 2022-03-29 ENCOUNTER — Telehealth (INDEPENDENT_AMBULATORY_CARE_PROVIDER_SITE_OTHER): Payer: Self-pay | Admitting: Gastroenterology

## 2022-03-29 MED ORDER — PANTOPRAZOLE SODIUM 40 MG PO TBEC: 40.0000 mg | DELAYED_RELEASE_TABLET | Freq: Every day | ORAL | 2 refills | Status: DC

## 2022-03-29 NOTE — Telephone Encounter (Signed)
Hi Kelli Thompson,  Can you please schedule a follow up appointment for this patient in 3-4 weeks with Dr. Abbey Chatters or any of the APPs?  Thanks,  Maylon Peppers, MD Gastroenterology and Hepatology [facility]astroenter[facility]

## 2022-03-29 NOTE — Plan of Care (Signed)

## 2022-03-29 NOTE — Progress Notes (Signed)
Kelli Thompson, M.D. Gastroenterology & Hepatology   Interval History:  NAEON. Patient reports feeling very well and denies having any complaints such as nausea, vomiting, fever, chills, abdominal pain or distention.  Tolerated soft diet adequately.  Inpatient Medications:  Current Facility-Administered Medications:    0.9 %  sodium chloride infusion, , Intravenous, Continuous, Samuella Cota, MD, Last Rate: 75 mL/hr at 03/29/22 0300, Infusion Verify at 03/29/22 0300   acetaminophen (TYLENOL) tablet 650 mg, 650 mg, Oral, Q6H PRN, 650 mg at 03/27/22 0827 **OR** acetaminophen (TYLENOL) suppository 650 mg, 650 mg, Rectal, Q6H PRN, Samuella Cota, MD   carvedilol (COREG) tablet 12.5 mg, 12.5 mg, Oral, BID WC, Samuella Cota, MD, 12.5 mg at 03/29/22 0813   HYDROcodone-acetaminophen (NORCO/VICODIN) 5-325 MG per tablet 1-2 tablet, 1-2 tablet, Oral, Q6H PRN, Samuella Cota, MD   HYDROmorphone (DILAUDID) injection 0.5 mg, 0.5 mg, Intravenous, Q4H PRN, Samuella Cota, MD   ondansetron [facility]) tablet 4 mg, 4 mg, Oral, Q6H PRN **OR** ondansetron (ZOFRAN) injection 4 mg, 4 mg, Intravenous, Q6H PRN, Samuella Cota, MD   pantoprazole (PROTONIX) injection 40 mg, 40 mg, Intravenous, Q12H, Samuella Cota, MD, 40 mg at 03/29/22 0813   sodium chloride flush (NS) 0.9 % injection 3 mL, 3 mL, Intravenous, Q12H, Samuella Cota, MD, 3 mL at 03/29/22 0813   I/O    Intake/Output Summary (Last 24 hours) at 03/29/2022 0826 Last data filed at 03/29/2022 0300 Gross per 24 hour  Intake 1875.48 ml  Output --  Net 1875.48 ml     Physical Exam: Temp:  [98.2 F (36.8 C)-98.6 F (37 C)] 98.5 F (36.9 C) (03/02 0541) Pulse Rate:  [73-81] 81 (03/02 0541) Resp:  [16-20] 20 (03/02 0541) BP: (152-160)/(90-99) 152/90 (03/02 0541) SpO2:  [95 %-97 %] 97 % (03/02 0541)  Temp (24hrs), Avg:98.4 F (36.9 C), Min:98.2 F (36.8 C), Max:98.6 F (37 C) GENERAL: The patient is AO x3, in no acute  distress. HEENT: Head is normocephalic and atraumatic. EOMI are intact. Mouth is well hydrated and without lesions. NECK: Supple. No masses LUNGS: Clear to auscultation. No presence of rhonchi/wheezing/rales. Adequate chest expansion HEART: RRR, normal s1 and s2. ABDOMEN: Soft, nontender, no guarding, no peritoneal signs, and nondistended. BS +. No masses. EXTREMITIES: Without any cyanosis, clubbing, rash, lesions or edema. NEUROLOGIC: AOx3, no focal motor deficit. SKIN: no jaundice, no rashes  Laboratory Data: CBC:     Component Value Date/Time   WBC 9.5 03/27/2022 0445   RBC 4.40 03/27/2022 0445   HGB 12.2 03/27/2022 0445   HGB 14.2 02/26/2021 0850   HCT 37.5 03/27/2022 0445   HCT 42.7 02/26/2021 0850   PLT 144 (L) 03/27/2022 0445   PLT 258 02/26/2021 0850   MCV 85.2 03/27/2022 0445   MCV 83 02/26/2021 0850   MCH 27.7 03/27/2022 0445   MCHC 32.5 03/27/2022 0445   RDW 14.5 03/27/2022 0445   RDW 14.6 02/26/2021 0850   LYMPHSABS 1.3 03/26/2022 1100   LYMPHSABS 1.9 02/26/2021 0850   MONOABS 0.5 03/26/2022 1100   EOSABS 0.3 03/26/2022 1100   EOSABS 0.4 02/26/2021 0850   BASOSABS 0.0 03/26/2022 1100   BASOSABS 0.1 02/26/2021 0850   COAG: No results found for: "INR", "PROTIME"  BMP:     Latest Ref Rng & Units 03/28/2022    4:37 AM 03/27/2022    4:45 AM 03/26/2022   11:00 AM  BMP  Glucose 70 - 99 mg/dL 117  134  148  BUN 6 - 20 mg/dL '9  13  14   '$ Creatinine 0.44 - 1.00 mg/dL 0.68  0.75  0.65   Sodium 135 - 145 mmol/L 137  135  134   Potassium 3.5 - 5.1 mmol/L 2.8  3.5  2.9   Chloride 98 - 111 mmol/L 103  101  99   CO2 22 - 32 mmol/L '27  27  27   '$ Calcium 8.9 - 10.3 mg/dL 8.0  8.3  8.7     HEPATIC:     Latest Ref Rng & Units 03/28/2022    4:37 AM 03/27/2022    4:45 AM 03/26/2022   11:00 AM  Hepatic Function  Total Protein 6.5 - 8.1 g/dL 6.2  6.6  7.3   Albumin 3.5 - 5.0 g/dL 3.2  3.4  3.9   AST 15 - 41 U/L '13  14  14   '$ ALT 0 - 44 U/L '18  18  20   '$ Alk Phosphatase 38 -  126 U/L 74  79  94   Total Bilirubin 0.3 - 1.2 mg/dL 0.7  0.8  0.9     CARDIAC:  Lab Results  Component Value Date   CKTOTAL 76 04/22/2014   TROPONINI <0.03 04/22/2014      Imaging: I personally reviewed and interpreted the available labs, imaging and endoscopic files.   Assessment/Plan: 57 year old female with past medical history of recurrent pancreatitis, anxiety, depression, GERD, hypertension, who came to the hospital after presenting recurrent episode of  acute pancreatitis.  the patient does not drink alcohol.  She had normal triglycerides and calcium levels, IgG4 level was normal.  Notably, she recently restarted using hydrochlorothiazide (3 weeks ago), which she was taking at the time she had her first episode of acute pancreatitis.  It is very likely she developed acute pancreatitis due to this medication -  I strongly advised her to  absolutely avoid this medication in the future . Patient understood and agreed.  Patient will follow as outpatient in the GI clinic and will  discussed possibly  performing  MRCP in 12 weeks.  Diet should be advanced to GI soft diet and low-fat, should continue on this diet for 3 to 4 weeks.  - Advance diet as tolerated, low fat soft diet - Avoid using hydrochlorothiazide or other thiazides. - Will discuss possibility of MRCP as outpatient - Patient will follow up in GI clinic in 3-4 weeks. - GI service will sign-off, please call us back if you have any more questions.  Kelli Peppers, MD Gastroenterology and Hepatology [facility]astroenterology

## 2022-03-29 NOTE — Discharge Summary (Signed)
Physician Discharge Summary   Patient: Kelli Thompson MRN: [id number] DOB: [birth year]  Admit date:     03/26/2022  Discharge date: 03/29/22  Discharge Physician: Kelli Thompson   PCP: Kelli Fraise, MD   Recommendations at discharge:   Acute pancreatitis  Stopped hydrochlorothiazide, suspect as etiology  Outpatient MRI Abd/MRCP in 12 weeks to rule out underlying pancreatic lesions/stones/pancreas divisum recommended, GI referral for outpatient follow-up placed Daily PPI Follow-up pending IgG4 levels   Increasing sclerotic and mixed bone lesions.  Follow-up whole-body bone scan may be of some benefit further delineate in the outpatient setting. Follow-up as an outpatient. Discussed with patient. She is aware. Will bring to PCP attention in discharge summary.  Some lesions present since 2015.  Please follow-up as an outpatient  Discharge Diagnoses: Active Problems:   Acute pancreatitis  Resolved Problems:   * No resolved hospital problems. *  Hospital Course: 57 year old woman PMH pancreatitis secondary to lisinopril, presented to the emergency department with a week and a half of epigastric abdominal pain. Admitted for abdominal pain and CT showed pancreatitis.  Treated with supportive care.  Hydrochlorothiazide thought to be inciting issue and stopped.  Seen by gastroenterology and will follow-up in the outpatient setting as detailed below.  Tolerating diet pain resolved.  Stable for discharge.  Acute severe abdominal pain, acute pancreatitis by imaging although lipase was normal.  Exam is benign.  No signs or symptoms of abdominal catastrophe.  Afebrile, hemodynamic stable, normal WBC. Clinically resolved.  Tyler adding diet. Appreciate GI involvement.  Stopped hydrochlorothiazide. Outpatient MRI Abd/MRCP in 12 weeks to rule out underlying pancreatic lesions/stones/pancreas divisum  Daily PPI Can go home.   Hypokalemia Repleted Mg2+ borderline, repleted   Increasing  sclerotic and mixed bone lesions.  Follow-up whole-body bone scan may be of some benefit further delineate in the outpatient setting. Follow-up as an outpatient. Discussed with patient. She is aware. Will bring to PCP attention in discharge summary.  Some lesions present since 2015.  Doubt clinically significant.  Consultants:  Gastroenterology  Procedures performed:  None  Disposition: Home Diet recommendation:  Low fat DISCHARGE MEDICATION: Allergies as of 03/29/2022       Reactions   Ace Inhibitors    pancreatitis   Other    Any of the -prils, manly lisinopril        Medication List     STOP taking these medications    famotidine 20 MG tablet Commonly known as: PEPCID   loratadine 10 MG tablet Commonly known as: CLARITIN   olmesartan-hydrochlorothiazide 40-25 MG tablet Commonly known as: Benicar HCT       TAKE these medications    amLODipine 5 MG tablet Commonly known as: NORVASC Take 1 tablet (5 mg total) by mouth daily. TAKE ONE TABLET BY MOUTH DAILY FOR BLOOD PRESSURE   carvedilol 12.5 MG tablet Commonly known as: COREG TAKE ONE TABLET BY MOUTH TWICE A DAY   DULoxetine 60 MG capsule Commonly known as: CYMBALTA TAKE ONE CAPSULE BY MOUTH DAILY WITH SUPPER   pantoprazole 40 MG tablet Commonly known as: Protonix Take 1 tablet (40 mg total) by mouth daily.   potassium chloride SA 20 MEQ tablet Commonly known as: Klor-Con M20 TAKE ONE TABLET BY MOUTH DAILY FOR POTASSIUM        Follow-up Information     Kelli Fraise, MD. Schedule an appointment as soon as possible for a visit in 1 week(s).   Specialty: Family Medicine Contact information: [place] [address]  [facility] Follow up.   Why: Office will call you with an appointment. If you do not hear from them, please call them for an appointment for 3 months. Contact information: [place]  [place] [phone]               Feels better Tolerating diet No real pain Ready to go home  Discharge Exam: Filed Weights   03/26/22 0846 03/26/22 1844  Weight: 111.1 kg (!) 142.9 kg   Physical Exam Vitals reviewed.  Constitutional:      General: She is not in acute distress.    Appearance: She is not ill-appearing or toxic-appearing.  Cardiovascular:     Rate and Rhythm: Normal rate and regular rhythm.     Heart sounds: No murmur heard. Pulmonary:     Effort: Pulmonary effort is normal. No respiratory distress.     Breath sounds: No wheezing, rhonchi or rales.  Abdominal:     General: There is no distension.     Palpations: Abdomen is soft.  Neurological:     Mental Status: She is alert.  Psychiatric:        Mood and Affect: Mood normal.        Behavior: Behavior normal.      Condition at discharge: good  The results of significant diagnostics from this hospitalization (including imaging, microbiology, ancillary and laboratory) are listed below for reference.   Imaging Studies: CT ABDOMEN PELVIS W CONTRAST  Result Date: 03/26/2022 CLINICAL DATA:  Acute abdominal pain EXAM: CT ABDOMEN AND PELVIS WITH CONTRAST TECHNIQUE: Multidetector CT imaging of the abdomen and pelvis was performed using the standard protocol following bolus administration of intravenous contrast. RADIATION DOSE REDUCTION: This exam was performed according to the departmental dose-optimization program which includes automated exposure control, adjustment of the mA and/or kV according to patient size and/or use of iterative reconstruction technique. CONTRAST:  146m OMNIPAQUE IOHEXOL 300 MG/ML  SOLN COMPARISON:  04/17/2013 CT. FINDINGS: Lower chest: There is some linear opacity lung bases likely scar for atelectasis. Mild motion at the lung bases. Hepatobiliary: Nonspecific periportal edema identified. No space-occupying liver lesion. Previous cholecystectomy. Patent portal vein. Pancreas: There  is stranding along the head and neck of the pancreas extending into the central mesentery and some thickening extending along the right anterior perinephric space. There is also stranding around the second and proximal third portion of the duodenal. The pancreas itself is diffusely fatty replaced. No well-defined fluid collections or areas of poor enhancement. Spleen: Spleen has a longitudinal length of 14.3 cm. Preserved enhancement. Splenic enlargement. Small splenule. Adrenals/Urinary Tract: Adrenal glands are preserved. No renal collecting system dilatation. There are some tiny cystic foci in the right kidney which are under a cm but has a appearance of Bosniak 1 cysts. No specific imaging follow-up. Largest is in the upper pole of the right measuring 7 mm. There is a low-attenuation lesion on the upper pole left kidney which is slightly larger at 13 mm but again has a appearance of benign Bosniak 1 cysts. No specific imaging follow-up. The ureters have normal course and caliber down to the bladder. The bladder is underdistended. Stomach/Bowel: Few sigmoid colon diverticula. Mild colonic stool. Large bowel overall has a normal course and caliber. The appendix is not clearly seen in the right lower quadrant but there may be some surgical changes along the base of the cecum. Stomach is underdistended. Small bowel is nondilated.  Vascular/Lymphatic: Normal caliber aorta and IVC with mild atherosclerotic calcified plaque. There are several small less than 1 cm size in upper abdominal and retroperitoneal nodes, not pathologic by size criteria. These could be reactive. Reproductive: Status post hysterectomy. No adnexal masses. Other: No free intra-abdominal air identified.  No frank ascites. Musculoskeletal: Scattered degenerative changes identified of the spine and pelvis. Once again there are multiple sclerotic lesions identified. Dominant foci seen left iliac bone, right upper hemi sacrum. These have been present  since at least 2015. There are increasing lesions identified however such as posterior left acetabulum on image 81 measuring 8 mm today and previously 2-3 mm. Please correlate with prior workup. Also increasing lesion in the L2 vertebral body which is more mixed lucent and sclerotic. IMPRESSION: Changes consistent of pancreatitis with stranding along the pancreatic body and head and towards the duodenal. No complicating features at this time. No vessel occlusion, enhancing fluid collection or areas of pancreatic necrosis. Alternative differential would include duodenitis. Mild splenic enlargement. Increasing sclerotic and mixed bone lesions. Please correlate for any recent cancer diagnosis. With change a follow-up whole-body bone scan may be of some benefit further delineate. Electronically Signed   By: Jill Side M.D.   On: 03/26/2022 12:55   DG Chest 2 View  Result Date: 03/26/2022 CLINICAL DATA:  Shortness of breath. EXAM: CHEST - 2 VIEW COMPARISON:  None Available. FINDINGS: The heart size and mediastinal contours are within normal limits. Both lungs are clear. The visualized skeletal structures are unremarkable. IMPRESSION: No active cardiopulmonary disease. Electronically Signed   By: Marijo Conception M.D.   On: 03/26/2022 10:05    Microbiology: Results for orders placed or performed during the hospital encounter of 01/10/22  Resp panel by RT-PCR (RSV, Flu A&B, Covid) Anterior Nasal Swab     Status: None   Collection Time: 01/10/22  8:30 AM   Specimen: Anterior Nasal Swab  Result Value Ref Range Status   SARS Coronavirus 2 by RT PCR NEGATIVE NEGATIVE Final    Comment: (NOTE) SARS-CoV-2 target nucleic acids are NOT DETECTED.  The SARS-CoV-2 RNA is generally detectable in upper respiratory specimens during the acute phase of infection. The lowest concentration of SARS-CoV-2 viral copies this assay can detect is 138 copies/mL. A negative result does not preclude SARS-Cov-2 infection and  should not be used as the sole basis for treatment or other patient management decisions. A negative result may occur with  improper specimen collection/handling, submission of specimen other than nasopharyngeal swab, presence of viral mutation(s) within the areas targeted by this assay, and inadequate number of viral copies(<138 copies/mL). A negative result must be combined with clinical observations, patient history, and epidemiological information. The expected result is Negative.  Fact Sheet for Patients:  EntrepreneurPulse.com.au  Fact Sheet for Healthcare Providers:  IncredibleEmployment.be  This test is no t yet approved or cleared by the [place] FDA and  has been authorized for detection and/or diagnosis of SARS-CoV-2 by FDA under an Emergency Use Authorization (EUA). This EUA will remain  in effect (meaning this test can be used) for the duration of the COVID-19 declaration under Section 564(b)(1) of the Act, 21 U.S.C.section 360bbb-3(b)(1), unless the authorization is terminated  or revoked sooner.       Influenza A by PCR NEGATIVE NEGATIVE Final   Influenza B by PCR NEGATIVE NEGATIVE Final    Comment: (NOTE) The Xpert Xpress SARS-CoV-2/FLU/RSV plus assay is intended as an aid in the diagnosis of influenza from Nasopharyngeal swab  specimens and should not be used as a sole basis for treatment. Nasal washings and aspirates are unacceptable for Xpert Xpress SARS-CoV-2/FLU/RSV testing.  Fact Sheet for Patients: EntrepreneurPulse.com.au  Fact Sheet for Healthcare Providers: IncredibleEmployment.be  This test is not yet approved or cleared by the [place] FDA and has been authorized for detection and/or diagnosis of SARS-CoV-2 by FDA under an Emergency Use Authorization (EUA). This EUA will remain in effect (meaning this test can be used) for the duration of the COVID-19 declaration  under Section 564(b)(1) of the Act, 21 U.S.C. section 360bbb-3(b)(1), unless the authorization is terminated or revoked.     Resp Syncytial Virus by PCR NEGATIVE NEGATIVE Final    Comment: (NOTE) Fact Sheet for Patients: EntrepreneurPulse.com.au  Fact Sheet for Healthcare Providers: IncredibleEmployment.be  This test is not yet approved or cleared by the [place] FDA and has been authorized for detection and/or diagnosis of SARS-CoV-2 by FDA under an Emergency Use Authorization (EUA). This EUA will remain in effect (meaning this test can be used) for the duration of the COVID-19 declaration under Section 564(b)(1) of the Act, 21 U.S.C. section 360bbb-3(b)(1), unless the authorization is terminated or revoked.  Performed at [facility], [address] [place], [place] [address]     Labs: CBC: Recent Labs  Lab 03/26/22 1100 03/27/22 0445  WBC 9.7 9.5  NEUTROABS 7.6  --   HGB 12.7 12.2  HCT 38.4 37.5  MCV 82.2 85.2  PLT 207 123456*   Basic Metabolic Panel: Recent Labs  Lab 03/26/22 1100 03/27/22 0445 03/28/22 0437 03/28/22 0851  NA 134* 135 137  --   K 2.9* 3.5 2.8*  --   CL 99 101 103  --   CO2 '27 27 27  '$ --   GLUCOSE 148* 134* 117*  --   BUN '14 13 9  '$ --   CREATININE 0.65 0.75 0.68  --   CALCIUM 8.7* 8.3* 8.0*  --   MG  --   --   --  1.8   Liver Function Tests: Recent Labs  Lab 03/26/22 1100 03/27/22 0445 03/28/22 0437  AST 14* 14* 13*  ALT '20 18 18  '$ ALKPHOS 94 79 74  BILITOT 0.9 0.8 0.7  PROT 7.3 6.6 6.2*  ALBUMIN 3.9 3.4* 3.2*   CBG: No results for input(s): "GLUCAP" in the last 168 hours.  Discharge time spent: less than 30 minutes.  Signed: Murray Hodgkins, MD [facility] 03/29/2022

## 2022-03-31 ENCOUNTER — Telehealth: Payer: Self-pay

## 2022-03-31 NOTE — Transitions of Care (Post Inpatient/ED Visit) (Signed)
   03/31/2022  Name: Kelli Thompson MRN: [id number] DOB: 01/16/1966  Today's TOC FU Call Status: Today's TOC FU Call Status:: Successful TOC FU Call Competed TOC FU Call Complete Date: 03/31/22  Transition Care Management Follow-up Telephone Call Date of Discharge: 03/29/22 Discharge [facility] Type of Discharge: Inpatient Admission Primary Inpatient Discharge Diagnosis:: acute pancreatitis How have you been since you were released from the hospital?: Better Any questions or concerns?: No  Items Reviewed: Did you receive and understand the discharge instructions provided?: Yes Medications obtained and verified?: Yes (Medications Reviewed) Any new allergies since your discharge?: No Dietary orders reviewed?: Yes Do you have support at home?: Yes People in Home: spouse  Home Care and Equipment/Supplies: [place] Ordered?: NA Any new equipment or medical supplies ordered?: NA  Functional Questionnaire: Do you need assistance with bathing/showering or dressing?: No Do you need assistance with meal preparation?: No Do you need assistance with eating?: No Do you have difficulty maintaining continence: No Do you need assistance with getting out of bed/getting out of a chair/moving?: No Do you have difficulty managing or taking your medications?: No  Folllow up appointments reviewed: PCP Follow-up appointment confirmed?: No MD Provider Line [phone] Given: No (no avail appt times within the week. sent message to staff to schedule) Specialist Hospital Follow-up appointment confirmed?: NA Do you need transportation to your follow-up appointment?: No Do you understand care options if your condition(s) worsen?: Yes-patient verbalized understanding  Patient had to stop HCTZ per hospitalist. Needs new BP med  [place], [place] Direct Dial [phone]

## 2022-04-07 ENCOUNTER — Encounter: Payer: Self-pay | Admitting: Family Medicine

## 2022-04-07 ENCOUNTER — Ambulatory Visit (INDEPENDENT_AMBULATORY_CARE_PROVIDER_SITE_OTHER): Payer: Self-pay | Admitting: Family Medicine

## 2022-04-07 VITALS — BP 138/98 | HR 91 | Temp 97.6°F | Ht 65.0 in | Wt 245.0 lb

## 2022-04-07 DIAGNOSIS — M5416 Radiculopathy, lumbar region: Secondary | ICD-10-CM

## 2022-04-07 DIAGNOSIS — I10 Essential (primary) hypertension: Secondary | ICD-10-CM

## 2022-04-07 DIAGNOSIS — K858 Other acute pancreatitis without necrosis or infection: Secondary | ICD-10-CM

## 2022-04-07 MED ORDER — FUROSEMIDE 20 MG PO TABS: 20.0000 mg | ORAL_TABLET | Freq: Every day | ORAL | 3 refills | Status: DC

## 2022-04-07 MED ORDER — TIZANIDINE HCL 4 MG PO TABS: 4.0000 mg | ORAL_TABLET | Freq: Four times a day (QID) | ORAL | 1 refills | Status: DC | PRN

## 2022-04-07 MED ORDER — AMLODIPINE BESYLATE 10 MG PO TABS: 10.0000 mg | ORAL_TABLET | Freq: Every day | ORAL | 1 refills | Status: DC

## 2022-04-07 NOTE — Progress Notes (Signed)
Subjective:  Patient ID: Kelli Thompson, female    DOB: 1965/07/03  Age: 57 y.o. MRN: [id number]  CC: Hospitalization Follow-up   HPI Kelli Thompson presents for Follow up of hospitalization from 2/28 to 03/29/22 due to pancreatitis. Hospitalist concerned that HCTZ was causative. Put herself back on furosemide at DC. No edema. BP running high.    Bent over at work tipped 55 gallon drum. Couldn't stand up.Experienced pain at the  lower back in a band across the upper lumbar area. Onset 2-3 days prior to hospitalization. Pain in ack persists, but now to the left at the L2 muscles radiating to the left hip and lateral thigh.      04/07/2022    8:13 AM 08/22/2021    8:00 AM 02/26/2021    8:16 AM  Depression screen PHQ 2/9  Decreased Interest 0 0 0  Down, Depressed, Hopeless 0 0 0  PHQ - 2 Score 0 0 0    History Kelli Thompson has a past medical history of Anxiety, Arthritis, Depression, GERD (gastroesophageal reflux disease), Hypertension, Pancreatitis, Peri-menopause (02/28/2014), Urinary frequency (01/30/2014), and Urinary incontinence, mixed (01/30/2014).   Kelli Thompson has a past surgical history that includes Cesarean section; Cholecystectomy; Appendectomy; Abdominal hysterectomy; Colonoscopy with propofol (N/A, 02/02/2020); Breast biopsy (Right, 02/20/2020); cervical discectomy and fusion; and Neck surgery.   Kelli Thompson family history includes Alcohol abuse in Kelli Thompson paternal grandfather; Arthritis in Kelli Thompson maternal grandmother; Asthma in Kelli Thompson father; Cancer in Kelli Thompson paternal grandmother; Congestive Heart Failure in Kelli Thompson maternal grandmother; Diabetes in Kelli Thompson maternal grandmother and mother; Hypertension in Kelli Thompson father and mother; Other in Kelli Thompson son; Parkinson's disease in Kelli Thompson maternal grandfather; Stroke in Kelli Thompson father.Kelli Thompson reports that Kelli Thompson quit smoking about 9 years ago. Kelli Thompson smoking use included cigarettes. Kelli Thompson has a 15.00 pack-year smoking history. Kelli Thompson has never used smokeless tobacco. Kelli Thompson reports that Kelli Thompson does not drink  alcohol and does not use drugs.    ROS Review of Systems  Constitutional:  Positive for appetite change (decreased).  HENT: Negative.    Eyes:  Negative for visual disturbance.  Respiratory:  Negative for shortness of breath.   Cardiovascular:  Negative for chest pain.  Gastrointestinal:  Positive for abdominal pain and nausea. Negative for diarrhea and vomiting.  Musculoskeletal:  Negative for arthralgias.    Objective:  BP (!) 138/98   Pulse 91   Temp 97.6 F (36.4 C)   Ht '5\' 5"'$  (1.651 m)   Wt 245 lb (111.1 kg)   SpO2 95%   BMI 40.77 kg/m   BP Readings from Last 3 Encounters:  04/07/22 (!) 138/98  03/29/22 (!) 152/90  01/15/22 134/87    Wt Readings from Last 3 Encounters:  04/07/22 245 lb (111.1 kg)  03/26/22 (!) 315 lb (142.9 kg)  01/15/22 248 lb 6.4 oz (112.7 kg)     Physical Exam Constitutional:      General: Kelli Thompson is not in acute distress.    Appearance: Kelli Thompson is well-developed.  Cardiovascular:     Rate and Rhythm: Normal rate and regular rhythm.  Pulmonary:     Breath sounds: Normal breath sounds.  Abdominal:     Tenderness: There is abdominal tenderness (mild and diffuse).  Musculoskeletal:        General: Normal range of motion.  Skin:    General: Skin is warm and dry.  Neurological:     Mental Status: Kelli Thompson is alert and oriented to person, place, and time.       Assessment & Plan:  Chakita was seen today for hospitalization follow-up.  Diagnoses and all orders for this visit:  Other acute pancreatitis without infection or necrosis -     CMP14+EGFR -     CBC with Differential/Platelet -     Lipase  Essential hypertension -     amLODipine (NORVASC) 10 MG tablet; Take 1 tablet (10 mg total) by mouth daily. FOR BLOOD PRESSURE  Acute left lumbar radiculopathy  Other orders -     furosemide (LASIX) 20 MG tablet; Take 1 tablet (20 mg total) by mouth daily. -     tiZANidine (ZANAFLEX) 4 MG tablet; Take 1 tablet (4 mg total) by mouth every 6  (six) hours as needed for muscle spasms.       I have changed Kajuana W. Ferdinand's amLODipine. I am also having Kelli Thompson start on furosemide and tiZANidine. Additionally, I am having Kelli Thompson maintain Kelli Thompson potassium chloride SA, carvedilol, DULoxetine, and pantoprazole.  Allergies as of 04/07/2022       Reactions   Ace Inhibitors    pancreatitis   Other    Any of the -prils, manly lisinopril        Medication List        Accurate as of April 07, 2022  9:23 AM. If you have any questions, ask your nurse or doctor.          amLODipine 10 MG tablet Commonly known as: NORVASC Take 1 tablet (10 mg total) by mouth daily. FOR BLOOD PRESSURE What changed:  medication strength how much to take additional instructions Changed by: Claretta Fraise, MD   carvedilol 12.5 MG tablet Commonly known as: COREG TAKE ONE TABLET BY MOUTH TWICE A DAY   DULoxetine 60 MG capsule Commonly known as: CYMBALTA TAKE ONE CAPSULE BY MOUTH DAILY WITH SUPPER   furosemide 20 MG tablet Commonly known as: LASIX Take 1 tablet (20 mg total) by mouth daily. Started by: Claretta Fraise, MD   pantoprazole 40 MG tablet Commonly known as: Protonix Take 1 tablet (40 mg total) by mouth daily.   potassium chloride SA 20 MEQ tablet Commonly known as: Klor-Con M20 TAKE ONE TABLET BY MOUTH DAILY FOR POTASSIUM   tiZANidine 4 MG tablet Commonly known as: ZANAFLEX Take 1 tablet (4 mg total) by mouth every 6 (six) hours as needed for muscle spasms. Started by: Claretta Fraise, MD         Follow-up: Return in about 2 weeks (around 04/21/2022).  Claretta Fraise, M.D.

## 2022-04-08 LAB — CMP14+EGFR
ALT: 19 IU/L (ref 0–32)
AST: 12 IU/L (ref 0–40)
Albumin/Globulin Ratio: 1.9 (ref 1.2–2.2)
Albumin: 4.5 g/dL (ref 3.8–4.9)
Alkaline Phosphatase: 121 IU/L (ref 44–121)
BUN/Creatinine Ratio: 17 (ref 9–23)
BUN: 17 mg/dL (ref 6–24)
Bilirubin Total: 0.3 mg/dL (ref 0.0–1.2)
CO2: 26 mmol/L (ref 20–29)
Calcium: 9.5 mg/dL (ref 8.7–10.2)
Chloride: 102 mmol/L (ref 96–106)
Creatinine, Ser: 0.98 mg/dL (ref 0.57–1.00)
Globulin, Total: 2.4 g/dL (ref 1.5–4.5)
Glucose: 137 mg/dL — ABNORMAL HIGH (ref 70–99)
Potassium: 4 mmol/L (ref 3.5–5.2)
Sodium: 144 mmol/L (ref 134–144)
Total Protein: 6.9 g/dL (ref 6.0–8.5)
eGFR: 68 mL/min/{1.73_m2} (ref >59)

## 2022-04-08 LAB — CBC WITH DIFFERENTIAL/PLATELET
Basophils Absolute: 0.1 10*3/uL (ref 0.0–0.2)
Basos: 1 %
EOS (ABSOLUTE): 0.4 10*3/uL (ref 0.0–0.4)
Eos: 6 %
Hematocrit: 40.3 % (ref 34.0–46.6)
Hemoglobin: 13.1 g/dL (ref 11.1–15.9)
Immature Grans (Abs): 0 10*3/uL (ref 0.0–0.1)
Immature Granulocytes: 0 %
Lymphocytes Absolute: 1.7 10*3/uL (ref 0.7–3.1)
Lymphs: 25 %
MCH: 27.1 pg (ref 26.6–33.0)
MCHC: 32.5 g/dL (ref 31.5–35.7)
MCV: 83 fL (ref 79–97)
Monocytes Absolute: 0.4 10*3/uL (ref 0.1–0.9)
Monocytes: 6 %
Neutrophils Absolute: 4.4 10*3/uL (ref 1.4–7.0)
Neutrophils: 62 %
Platelets: 269 10*3/uL (ref 150–450)
RBC: 4.83 x10E6/uL (ref 3.77–5.28)
RDW: 14.6 % (ref 11.7–15.4)
WBC: 7 10*3/uL (ref 3.4–10.8)

## 2022-04-08 LAB — LIPASE: Lipase: 24 U/L (ref 14–72)

## 2022-04-09 NOTE — Progress Notes (Signed)
Hello Makaria,  Your lab result is normal and/or stable.Some minor variations that are not significant are commonly marked abnormal, but do not represent any medical problem for you.  Best regards, Claretta Fraise, M.D.

## 2022-04-20 NOTE — Progress Notes (Deleted)
GI Office Note    Referring Provider: Claretta Fraise, MD Primary Care Physician:  Claretta Fraise, MD  Primary Gastroenterologist: Elon Alas. Abbey Chatters, DO   Chief Complaint   No chief complaint on file.   History of Present Illness   Kelli Thompson is a 57 y.o. female presenting today for hospital follow up of pancreatitis.   During recent admission last month, CT with changes consistent with pancreatitis with stranding along the pancreatic body and head and towards duodenum.  No complicating features at this time.  Alternate differential would include duodenitis.  Mild splenic enlargement.  Increasing sclerotic and mixed bone lesions noted compared to 2015 CT.    She reports no etoh. On 02/19/22, her furosemide was stopped along with olmesartan and she was placed on olmesartan-HCTZ. In 2015, there was concern that patient's lisinopril/HCTZ at that time was cause of pancreatitis.   Plans for MRI Abd/MRCP in 12 weeks.   Her triglycerides and calcium levels were normal, IgG4 normal. LFTs normal.   Medications   Current Outpatient Medications  Medication Sig Dispense Refill   amLODipine (NORVASC) 10 MG tablet Take 1 tablet (10 mg total) by mouth daily. FOR BLOOD PRESSURE 90 tablet 1   carvedilol (COREG) 12.5 MG tablet TAKE ONE TABLET BY MOUTH TWICE A DAY 180 tablet 0   DULoxetine (CYMBALTA) 60 MG capsule TAKE ONE CAPSULE BY MOUTH DAILY WITH SUPPER 90 capsule 1   furosemide (LASIX) 20 MG tablet Take 1 tablet (20 mg total) by mouth daily. 30 tablet 3   pantoprazole (PROTONIX) 40 MG tablet Take 1 tablet (40 mg total) by mouth daily. 30 tablet 2   potassium chloride SA (KLOR-CON M20) 20 MEQ tablet TAKE ONE TABLET BY MOUTH DAILY FOR POTASSIUM 90 tablet 3   tiZANidine (ZANAFLEX) 4 MG tablet Take 1 tablet (4 mg total) by mouth every 6 (six) hours as needed for muscle spasms. 30 tablet 1   No current facility-administered medications for this visit.    Allergies   Allergies as of  04/21/2022 - Review Complete 04/07/2022  Allergen Reaction Noted   Ace inhibitors  03/28/2015   Other  12/12/2020     Past Medical History   Past Medical History:  Diagnosis Date   Anxiety    Arthritis    Depression    GERD (gastroesophageal reflux disease)    Hypertension    Pancreatitis    around 2013; x1 secondary to lisinopril per patient   Peri-menopause 02/28/2014   Urinary frequency 01/30/2014   Urinary incontinence, mixed 01/30/2014    Past Surgical History   Past Surgical History:  Procedure Laterality Date   ABDOMINAL HYSTERECTOMY     APPENDECTOMY     BREAST BIOPSY Right 02/20/2020   Procedure: BREAST EXCISIONAL BIOPSY AFTER SEED LOCATION;  Surgeon: Virl Cagey, MD;  Location: [facility];  Service: General;  Laterality: Right;   cervical discectomy and fusion     CESAREAN SECTION     x2   CHOLECYSTECTOMY     COLONOSCOPY WITH PROPOFOL N/A 02/02/2020   Procedure: COLONOSCOPY WITH PROPOFOL;  Surgeon: Eloise Harman, DO;  Location: [facility];  Service: Endoscopy;  Laterality: N/A;  8:15am   NECK SURGERY      Past Family History   Family History  Problem Relation Age of Onset   Hypertension Mother    Diabetes Mother        borderline   Hypertension Father    Asthma Father  Stroke Father    Diabetes Maternal Grandmother    Congestive Heart Failure Maternal Grandmother    Arthritis Maternal Grandmother    Parkinson's disease Maternal Grandfather    Cancer Paternal Grandmother    Alcohol abuse Paternal Grandfather    Other Son        stomach issues   Colon cancer Neg Hx     Past Social History   Social History   Socioeconomic History   Marital status: Married    Spouse name: Not on file   Number of children: Not on file   Years of education: Not on file   Highest education level: Not on file  Occupational History   Not on file  Tobacco Use   Smoking status: Former    Packs/day: 0.50    Years: 30.00    Additional pack years: 0.00     Total pack years: 15.00    Types: Cigarettes    Quit date: 03/27/2013    Years since quitting: 9.0   Smokeless tobacco: Never  Vaping Use   Vaping Use: Never used  Substance and Sexual Activity   Alcohol use: No   Drug use: No   Sexual activity: Yes    Birth control/protection: Surgical    Comment: hyst  Other Topics Concern   Not on file  Social History Narrative   Not on file   Social Determinants of Health   Financial Resource Strain: Not on file  Food Insecurity: No Food Insecurity (03/26/2022)   Hunger Vital Sign    Worried About Running Out of Food in the Last Year: Never true    Ran Out of Food in the Last Year: Never true  Transportation Needs: No Transportation Needs (03/26/2022)   PRAPARE - Transportation    Lack of Transportation (Medical): No    Lack of Transportation (Non-Medical): No  Physical Activity: Not on file  Stress: Not on file  Social Connections: Not on file  Intimate Partner Violence: Not At Risk (03/26/2022)   Humiliation, Afraid, Rape, and Kick questionnaire    Fear of Current or Ex-Partner: No    Emotionally Abused: No    Physically Abused: No    Sexually Abused: No    Review of Systems   General: Negative for anorexia, weight loss, fever, chills, fatigue, weakness. ENT: Negative for hoarseness, difficulty swallowing , nasal congestion. CV: Negative for chest pain, angina, palpitations, dyspnea on exertion, peripheral edema.  Respiratory: Negative for dyspnea at rest, dyspnea on exertion, cough, sputum, wheezing.  GI: See history of present illness. GU:  Negative for dysuria, hematuria, urinary incontinence, urinary frequency, nocturnal urination.  Endo: Negative for unusual weight change.     Physical Exam   There were no vitals taken for this visit.   General: Well-nourished, well-developed in no acute distress.  Eyes: No icterus. Mouth: Oropharyngeal mucosa moist and pink , no lesions erythema or exudate. Lungs: Clear to auscultation  bilaterally.  Heart: Regular rate and rhythm, no murmurs rubs or gallops.  Abdomen: Bowel sounds are normal, nontender, nondistended, no hepatosplenomegaly or masses,  no abdominal bruits or hernia , no rebound or guarding.  Rectal: ***  Extremities: No lower extremity edema. No clubbing or deformities. Neuro: Alert and oriented x 4   Skin: Warm and dry, no jaundice.   Psych: Alert and cooperative, normal mood and affect.  Labs   *** Imaging Studies   CT ABDOMEN PELVIS W CONTRAST  Result Date: 03/26/2022 CLINICAL DATA:  Acute abdominal pain EXAM: CT  ABDOMEN AND PELVIS WITH CONTRAST TECHNIQUE: Multidetector CT imaging of the abdomen and pelvis was performed using the standard protocol following bolus administration of intravenous contrast. RADIATION DOSE REDUCTION: This exam was performed according to the departmental dose-optimization program which includes automated exposure control, adjustment of the mA and/or kV according to patient size and/or use of iterative reconstruction technique. CONTRAST:  12mL OMNIPAQUE IOHEXOL 300 MG/ML  SOLN COMPARISON:  04/17/2013 CT. FINDINGS: Lower chest: There is some linear opacity lung bases likely scar for atelectasis. Mild motion at the lung bases. Hepatobiliary: Nonspecific periportal edema identified. No space-occupying liver lesion. Previous cholecystectomy. Patent portal vein. Pancreas: There is stranding along the head and neck of the pancreas extending into the central mesentery and some thickening extending along the right anterior perinephric space. There is also stranding around the second and proximal third portion of the duodenal. The pancreas itself is diffusely fatty replaced. No well-defined fluid collections or areas of poor enhancement. Spleen: Spleen has a longitudinal length of 14.3 cm. Preserved enhancement. Splenic enlargement. Small splenule. Adrenals/Urinary Tract: Adrenal glands are preserved. No renal collecting system dilatation. There  are some tiny cystic foci in the right kidney which are under a cm but has a appearance of Bosniak 1 cysts. No specific imaging follow-up. Largest is in the upper pole of the right measuring 7 mm. There is a low-attenuation lesion on the upper pole left kidney which is slightly larger at 13 mm but again has a appearance of benign Bosniak 1 cysts. No specific imaging follow-up. The ureters have normal course and caliber down to the bladder. The bladder is underdistended. Stomach/Bowel: Few sigmoid colon diverticula. Mild colonic stool. Large bowel overall has a normal course and caliber. The appendix is not clearly seen in the right lower quadrant but there may be some surgical changes along the base of the cecum. Stomach is underdistended. Small bowel is nondilated. Vascular/Lymphatic: Normal caliber aorta and IVC with mild atherosclerotic calcified plaque. There are several small less than 1 cm size in upper abdominal and retroperitoneal nodes, not pathologic by size criteria. These could be reactive. Reproductive: Status post hysterectomy. No adnexal masses. Other: No free intra-abdominal air identified.  No frank ascites. Musculoskeletal: Scattered degenerative changes identified of the spine and pelvis. Once again there are multiple sclerotic lesions identified. Dominant foci seen left iliac bone, right upper hemi sacrum. These have been present since at least 2015. There are increasing lesions identified however such as posterior left acetabulum on image 81 measuring 8 mm today and previously 2-3 mm. Please correlate with prior workup. Also increasing lesion in the L2 vertebral body which is more mixed lucent and sclerotic. IMPRESSION: Changes consistent of pancreatitis with stranding along the pancreatic body and head and towards the duodenal. No complicating features at this time. No vessel occlusion, enhancing fluid collection or areas of pancreatic necrosis. Alternative differential would include  duodenitis. Mild splenic enlargement. Increasing sclerotic and mixed bone lesions. Please correlate for any recent cancer diagnosis. With change a follow-up whole-body bone scan may be of some benefit further delineate. Electronically Signed   By: Jill Side M.D.   On: 03/26/2022 12:55   DG Chest 2 View  Result Date: 03/26/2022 CLINICAL DATA:  Shortness of breath. EXAM: CHEST - 2 VIEW COMPARISON:  None Available. FINDINGS: The heart size and mediastinal contours are within normal limits. Both lungs are clear. The visualized skeletal structures are unremarkable. IMPRESSION: No active cardiopulmonary disease. Electronically Signed   By: Bobbe Medico.D.  On: 03/26/2022 10:05    Assessment       PLAN   *** Bone lesions. MRI/MRCP  Laureen Ochs. Bobby Rumpf, [place], [facility]

## 2022-04-21 ENCOUNTER — Encounter: Payer: Self-pay | Admitting: Gastroenterology

## 2022-04-22 ENCOUNTER — Ambulatory Visit (INDEPENDENT_AMBULATORY_CARE_PROVIDER_SITE_OTHER): Payer: Self-pay | Admitting: Family Medicine

## 2022-04-22 ENCOUNTER — Encounter: Payer: Self-pay | Admitting: Family Medicine

## 2022-04-22 VITALS — BP 135/88 | HR 87 | Temp 98.7°F | Ht 65.0 in | Wt 242.6 lb

## 2022-04-22 DIAGNOSIS — I10 Essential (primary) hypertension: Secondary | ICD-10-CM

## 2022-04-22 DIAGNOSIS — K858 Other acute pancreatitis without necrosis or infection: Secondary | ICD-10-CM

## 2022-04-22 DIAGNOSIS — M5416 Radiculopathy, lumbar region: Secondary | ICD-10-CM

## 2022-04-22 LAB — BAYER DCA HB A1C WAIVED: HB A1C (BAYER DCA - WAIVED): 5.8 % — ABNORMAL HIGH (ref 4.8–5.6)

## 2022-04-22 NOTE — Patient Instructions (Signed)

## 2022-04-22 NOTE — Progress Notes (Signed)
Subjective:  Patient ID: Kelli Thompson, female    DOB: April 05, 1965  Age: 57 y.o. MRN: [id number]  CC: Follow-up   HPI Kelli Thompson presents for follow up of pancreatitis and back pain.She still some nausea. Had explosive Bms last week. More normal since 4 days ago. Checking glucose 108-148. Done at 8 AM & 2 hours after lunch and supper.     04/22/2022    7:55 AM 04/07/2022    8:13 AM 08/22/2021    8:00 AM  Depression screen PHQ 2/9  Decreased Interest 0 0 0  Down, Depressed, Hopeless 0 0 0  PHQ - 2 Score 0 0 0    History Kelli Thompson has a past medical history of Anxiety, Arthritis, Depression, GERD (gastroesophageal reflux disease), Hypertension, Pancreatitis, Peri-menopause (02/28/2014), Urinary frequency (01/30/2014), and Urinary incontinence, mixed (01/30/2014).   She has a past surgical history that includes Cesarean section; Cholecystectomy; Appendectomy; Abdominal hysterectomy; Colonoscopy with propofol (N/A, 02/02/2020); Breast biopsy (Right, 02/20/2020); cervical discectomy and fusion; and Neck surgery.   Her family history includes Alcohol abuse in her paternal grandfather; Arthritis in her maternal grandmother; Asthma in her father; Cancer in her paternal grandmother; Congestive Heart Failure in her maternal grandmother; Diabetes in her maternal grandmother and mother; Hypertension in her father and mother; Other in her son; Parkinson's disease in her maternal grandfather; Stroke in her father.She reports that she quit smoking about 9 years ago. Her smoking use included cigarettes. She has a 15.00 pack-year smoking history. She has never used smokeless tobacco. She reports that she does not drink alcohol and does not use drugs.    ROS Review of Systems  Constitutional: Negative.   HENT: Negative.    Eyes:  Negative for visual disturbance.  Respiratory:  Negative for shortness of breath.   Cardiovascular:  Negative for chest pain.  Gastrointestinal:  Positive for nausea.  Negative for abdominal pain.  Musculoskeletal:  Negative for arthralgias.    Objective:  BP 135/88   Pulse 87   Temp 98.7 F (37.1 C)   Ht 5\' 5"  (1.651 m)   Wt 242 lb 9.6 oz (110 kg)   SpO2 95%   BMI 40.37 kg/m   BP Readings from Last 3 Encounters:  04/22/22 135/88  04/07/22 (!) 138/98  03/29/22 (!) 152/90    Wt Readings from Last 3 Encounters:  04/22/22 242 lb 9.6 oz (110 kg)  04/07/22 245 lb (111.1 kg)  03/26/22 (!) 315 lb (142.9 kg)     Physical Exam Constitutional:      General: She is not in acute distress.    Appearance: She is well-developed.  Cardiovascular:     Rate and Rhythm: Normal rate and regular rhythm.  Pulmonary:     Breath sounds: Normal breath sounds.  Musculoskeletal:        General: Normal range of motion.  Skin:    General: Skin is warm and dry.  Neurological:     Mental Status: She is alert and oriented to person, place, and time.       Assessment & Plan:   Kelli Thompson was seen today for follow-up.  Diagnoses and all orders for this visit:  Other acute pancreatitis without infection or necrosis -     Bayer DCA Hb A1c Waived  Essential hypertension  Acute left lumbar radiculopathy       I am having Kelli Thompson maintain her potassium chloride SA, carvedilol, DULoxetine, pantoprazole, furosemide, amLODipine, and tiZANidine.  Allergies as of 04/22/2022  Reactions   Ace Inhibitors    pancreatitis   Other    Any of the -prils, manly lisinopril        Medication List        Accurate as of April 22, 2022  7:41 PM. If you have any questions, ask your nurse or doctor.          amLODipine 10 MG tablet Commonly known as: NORVASC Take 1 tablet (10 mg total) by mouth daily. FOR BLOOD PRESSURE   carvedilol 12.5 MG tablet Commonly known as: COREG TAKE ONE TABLET BY MOUTH TWICE A DAY   DULoxetine 60 MG capsule Commonly known as: CYMBALTA TAKE ONE CAPSULE BY MOUTH DAILY WITH SUPPER   furosemide 20 MG  tablet Commonly known as: LASIX Take 1 tablet (20 mg total) by mouth daily. What changed: how much to take   pantoprazole 40 MG tablet Commonly known as: Protonix Take 1 tablet (40 mg total) by mouth daily.   potassium chloride SA 20 MEQ tablet Commonly known as: Klor-Con M20 TAKE ONE TABLET BY MOUTH DAILY FOR POTASSIUM   tiZANidine 4 MG tablet Commonly known as: ZANAFLEX Take 1 tablet (4 mg total) by mouth every 6 (six) hours as needed for muscle spasms.         Follow-up: Return in about 3 months (around 07/23/2022), or if symptoms worsen or fail to improve.  Claretta Fraise, M.D.

## 2022-05-26 ENCOUNTER — Other Ambulatory Visit: Payer: Self-pay | Admitting: Family Medicine

## 2022-05-26 DIAGNOSIS — I10 Essential (primary) hypertension: Secondary | ICD-10-CM

## 2022-08-20 ENCOUNTER — Other Ambulatory Visit: Payer: Self-pay | Admitting: Family Medicine

## 2022-08-20 DIAGNOSIS — E876 Hypokalemia: Secondary | ICD-10-CM

## 2022-08-21 ENCOUNTER — Encounter: Payer: Self-pay | Admitting: Family Medicine

## 2022-08-21 ENCOUNTER — Ambulatory Visit (INDEPENDENT_AMBULATORY_CARE_PROVIDER_SITE_OTHER): Payer: Self-pay | Admitting: Family Medicine

## 2022-08-21 VITALS — BP 147/87 | HR 79 | Temp 97.7°F | Ht 65.0 in | Wt 245.2 lb

## 2022-08-21 DIAGNOSIS — R42 Dizziness and giddiness: Secondary | ICD-10-CM

## 2022-08-21 DIAGNOSIS — I10 Essential (primary) hypertension: Secondary | ICD-10-CM

## 2022-08-21 DIAGNOSIS — F32A Depression, unspecified: Secondary | ICD-10-CM

## 2022-08-21 MED ORDER — MECLIZINE HCL 25 MG PO TABS: 25.0000 mg | ORAL_TABLET | Freq: Three times a day (TID) | ORAL | 1 refills | Status: DC | PRN

## 2022-08-21 MED ORDER — CARVEDILOL 12.5 MG PO TABS: ORAL_TABLET | ORAL | 1 refills | Status: DC

## 2022-08-21 MED ORDER — AMLODIPINE BESYLATE 10 MG PO TABS: 10.0000 mg | ORAL_TABLET | Freq: Every day | ORAL | 1 refills | Status: DC

## 2022-08-21 MED ORDER — PANTOPRAZOLE SODIUM 40 MG PO TBEC: 40.0000 mg | DELAYED_RELEASE_TABLET | Freq: Every day | ORAL | 1 refills | Status: DC

## 2022-08-21 MED ORDER — DULOXETINE HCL 60 MG PO CPEP: ORAL_CAPSULE | ORAL | 1 refills | Status: DC

## 2022-08-21 NOTE — Progress Notes (Signed)
Subjective:  Patient ID: Kelli Thompson, female    DOB: Apr 20, 1965  Age: 57 y.o. MRN: [id number]  CC: Dizziness   HPI Kelli Thompson presents for Onset 1 week ago.  Gets a swimmy headed. Things seem to be wobbling back and forth. Sits still for a little bit and it goes away. Some nausea. Drinking a lot of water. Out in the heat all day. Occurs late morning mostly.      08/21/2022    4:03 PM 04/22/2022    7:55 AM 04/07/2022    8:13 AM  Depression screen PHQ 2/9  Decreased Interest 0 0 0  Down, Depressed, Hopeless 0 0 0  PHQ - 2 Score 0 0 0    History Kelli Thompson has a past medical history of Anxiety, Arthritis, Depression, GERD (gastroesophageal reflux disease), Hypertension, Pancreatitis, Peri-menopause (02/28/2014), Urinary frequency (01/30/2014), and Urinary incontinence, mixed (01/30/2014).   Kelli Thompson has a past surgical history that includes Cesarean section; Cholecystectomy; Appendectomy; Abdominal hysterectomy; Colonoscopy with propofol (N/A, 02/02/2020); Breast biopsy (Right, 02/20/2020); cervical discectomy and fusion; and Neck surgery.   Kelli Thompson family history includes Alcohol abuse in Kelli Thompson paternal grandfather; Arthritis in Kelli Thompson maternal grandmother; Asthma in Kelli Thompson father; Cancer in Kelli Thompson paternal grandmother; Congestive Heart Failure in Kelli Thompson maternal grandmother; Diabetes in Kelli Thompson maternal grandmother and mother; Hypertension in Kelli Thompson father and mother; Other in Kelli Thompson son; Parkinson's disease in Kelli Thompson maternal grandfather; Stroke in Kelli Thompson father.Kelli Thompson reports that Kelli Thompson quit smoking about 9 years ago. Kelli Thompson smoking use included cigarettes. Kelli Thompson started smoking about 39 years ago. Kelli Thompson has a 15 pack-year smoking history. Kelli Thompson has never used smokeless tobacco. Kelli Thompson reports that Kelli Thompson does not drink alcohol and does not use drugs.    ROS Review of Systems  Constitutional: Negative.   HENT: Negative.    Eyes:  Negative for visual disturbance.  Respiratory:  Negative for shortness of breath.   Cardiovascular:  Negative  for chest pain.  Gastrointestinal:  Negative for abdominal pain.  Musculoskeletal:  Negative for arthralgias.  Neurological:  Positive for dizziness (woozy, but no vertigo).    Objective:  BP (!) 147/87   Pulse 79   Temp 97.7 F (36.5 C)   Ht 5\' 5"  (1.651 m)   Wt 245 lb 3.2 oz (111.2 kg)   SpO2 97%   BMI 40.80 kg/m   BP Readings from Last 3 Encounters:  08/21/22 (!) 147/87  04/22/22 135/88  04/07/22 (!) 138/98    Wt Readings from Last 3 Encounters:  08/21/22 245 lb 3.2 oz (111.2 kg)  04/22/22 242 lb 9.6 oz (110 kg)  04/07/22 245 lb (111.1 kg)     Physical Exam Constitutional:      General: Kelli Thompson is not in acute distress.    Appearance: Kelli Thompson is well-developed.  Cardiovascular:     Rate and Rhythm: Normal rate and regular rhythm.  Pulmonary:     Breath sounds: Normal breath sounds.  Musculoskeletal:        General: Normal range of motion.  Skin:    General: Skin is warm and dry.  Neurological:     Mental Status: Kelli Thompson is alert and oriented to person, place, and time.    Assessment & Plan:   Kelli Thompson was seen today for dizziness.  Diagnoses and all orders for this visit:  Dizziness  Essential hypertension -     amLODipine (NORVASC) 10 MG tablet; Take 1 tablet (10 mg total) by mouth daily. FOR BLOOD PRESSURE -     carvedilol (COREG)  12.5 MG tablet; TAKE 1 TABLET BY MOUTH TWICE A DAY  Depression, unspecified depression type -     DULoxetine (CYMBALTA) 60 MG capsule; TAKE ONE CAPSULE BY MOUTH DAILY WITH SUPPER  Other orders -     meclizine (ANTIVERT) 25 MG tablet; Take 1 tablet (25 mg total) by mouth 3 (three) times daily as needed for dizziness. -     pantoprazole (PROTONIX) 40 MG tablet; Take 1 tablet (40 mg total) by mouth daily.       I have discontinued Kelli Thompson's furosemide. I am also having Kelli Thompson start on meclizine. Additionally, I am having Kelli Thompson maintain Kelli Thompson tiZANidine, potassium chloride SA, amLODipine, carvedilol, DULoxetine, and  pantoprazole.  Allergies as of 08/21/2022       Reactions   Ace Inhibitors    pancreatitis   Other    Any of the -prils, manly lisinopril        Medication List        Accurate as of August 21, 2022 11:59 PM. If you have any questions, ask your nurse or doctor.          STOP taking these medications    furosemide 20 MG tablet Commonly known as: LASIX Stopped by: Kelli Thompson       TAKE these medications    amLODipine 10 MG tablet Commonly known as: NORVASC Take 1 tablet (10 mg total) by mouth daily. FOR BLOOD PRESSURE   carvedilol 12.5 MG tablet Commonly known as: COREG TAKE 1 TABLET BY MOUTH TWICE A DAY   DULoxetine 60 MG capsule Commonly known as: CYMBALTA TAKE ONE CAPSULE BY MOUTH DAILY WITH SUPPER   meclizine 25 MG tablet Commonly known as: ANTIVERT Take 1 tablet (25 mg total) by mouth 3 (three) times daily as needed for dizziness. Started by: Kelli Thompson   pantoprazole 40 MG tablet Commonly known as: Protonix Take 1 tablet (40 mg total) by mouth daily.   potassium chloride SA 20 MEQ tablet Commonly known as: Klor-Con M20 TAKE 1 TABLET BY MOUTH DAILY FOR POTASSIUM   tiZANidine 4 MG tablet Commonly known as: ZANAFLEX Take 1 tablet (4 mg total) by mouth every 6 (six) hours as needed for muscle spasms.         Follow-up: No follow-ups on file.  Mechele Claude, M.D.

## 2022-08-24 ENCOUNTER — Encounter: Payer: Self-pay | Admitting: Family Medicine

## 2022-11-17 ENCOUNTER — Other Ambulatory Visit: Payer: Self-pay | Admitting: Family Medicine

## 2022-11-17 DIAGNOSIS — E876 Hypokalemia: Secondary | ICD-10-CM

## 2023-02-12 IMAGING — DX DG KNEE COMPLETE 4+V*L*
4 series · 4 of 4 positions shown · non-contrast
Comparison: None Available.

CLINICAL DATA: Heart knee 6 weeks ago.  Swollen and painful

EXAM:
LEFT KNEE - COMPLETE 4+ VIEW

[knee ap]
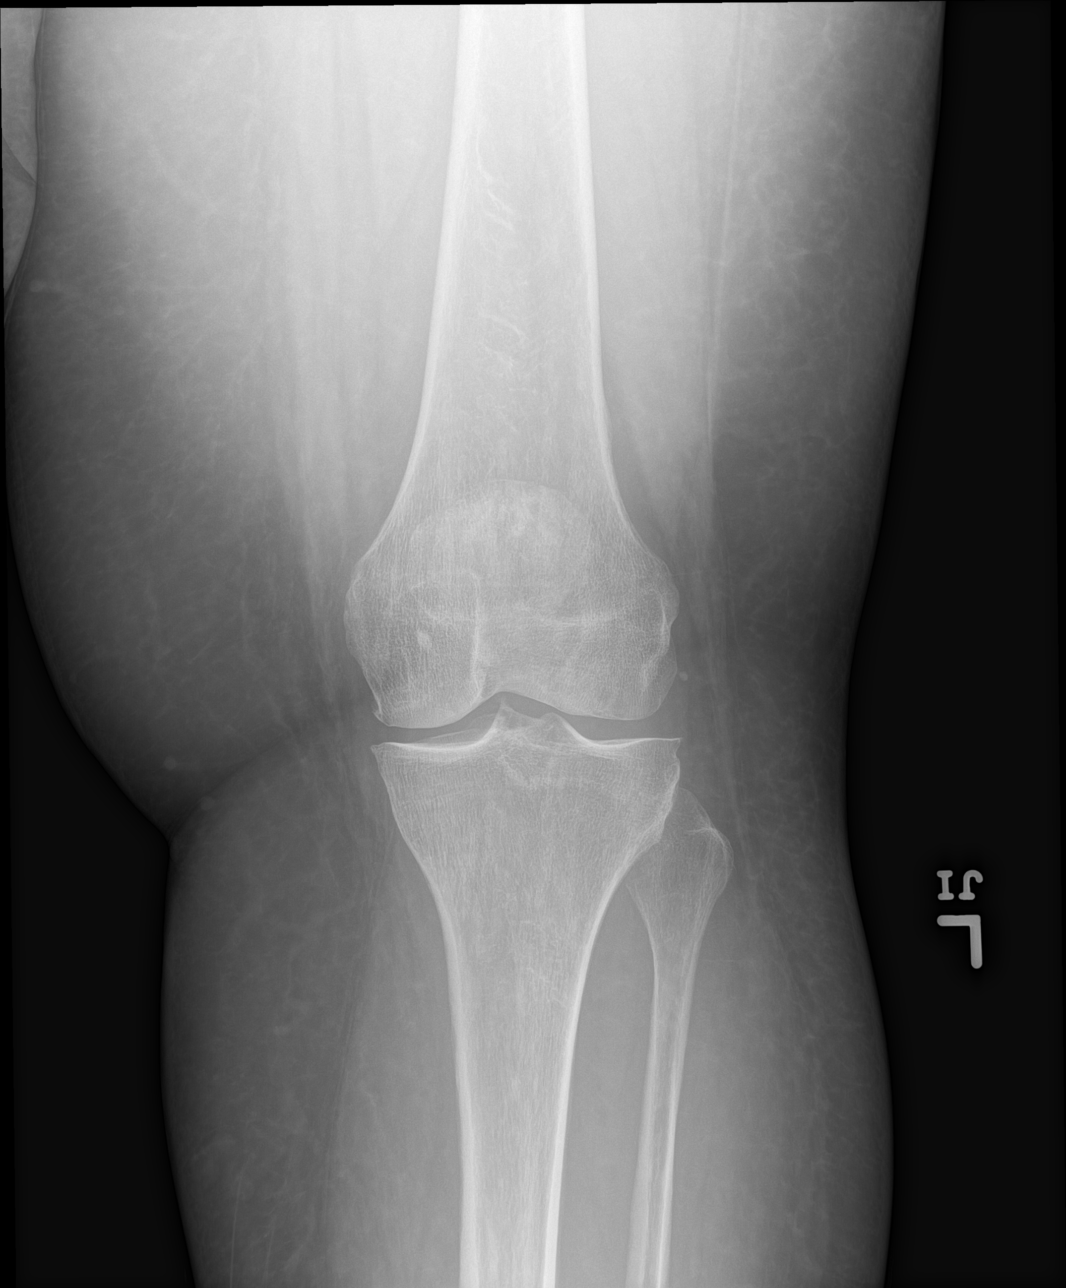

[knee obl (1 of 2)]
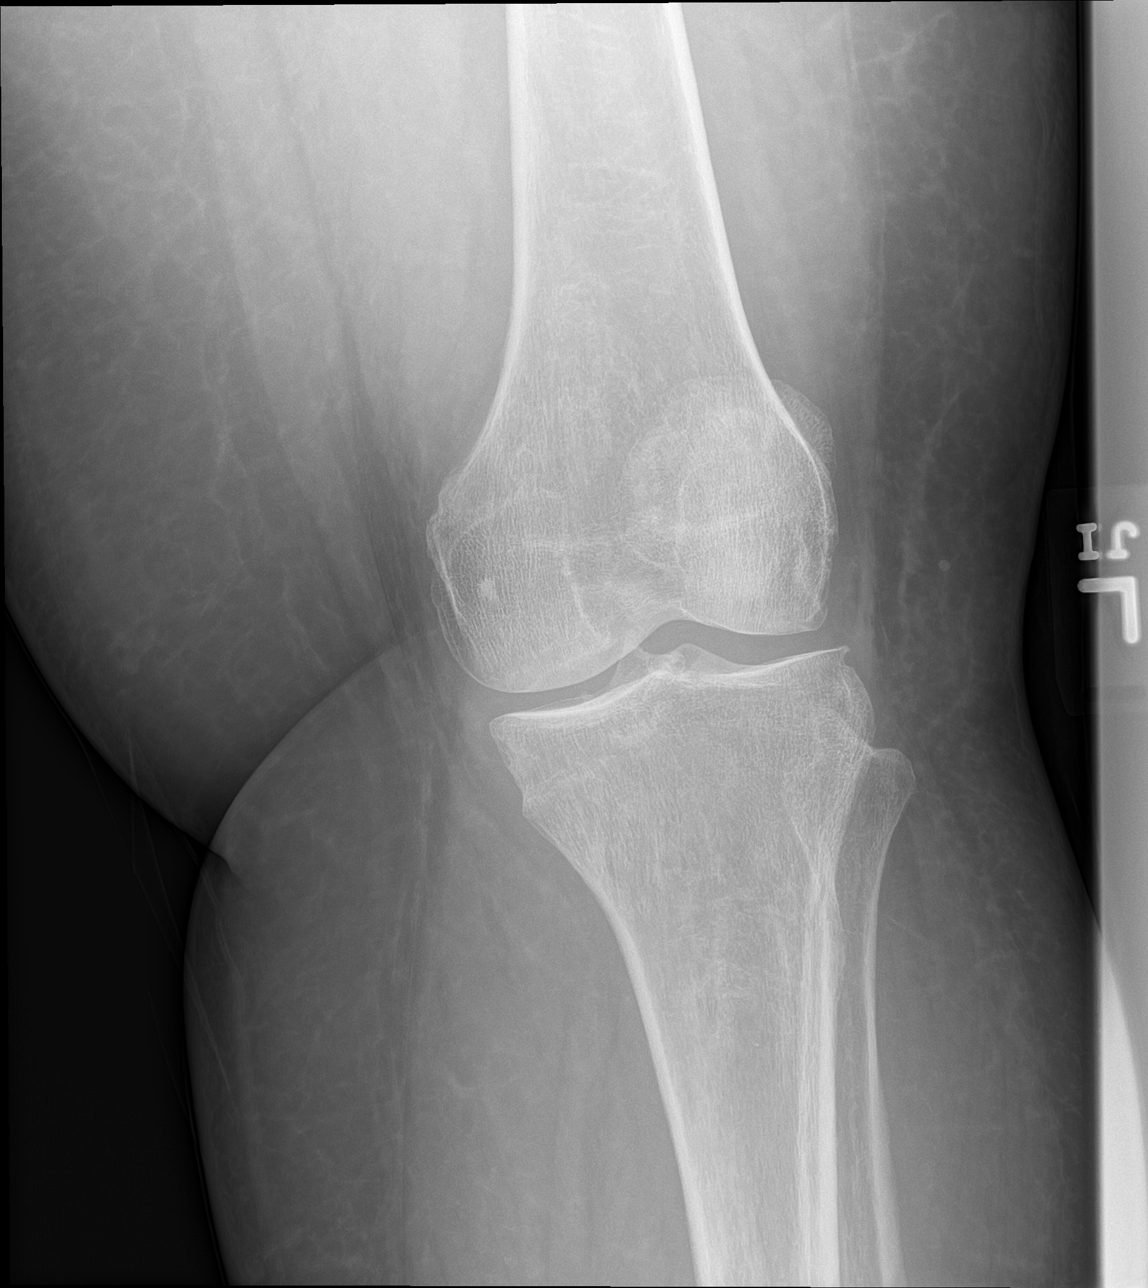

[knee obl (2 of 2)]
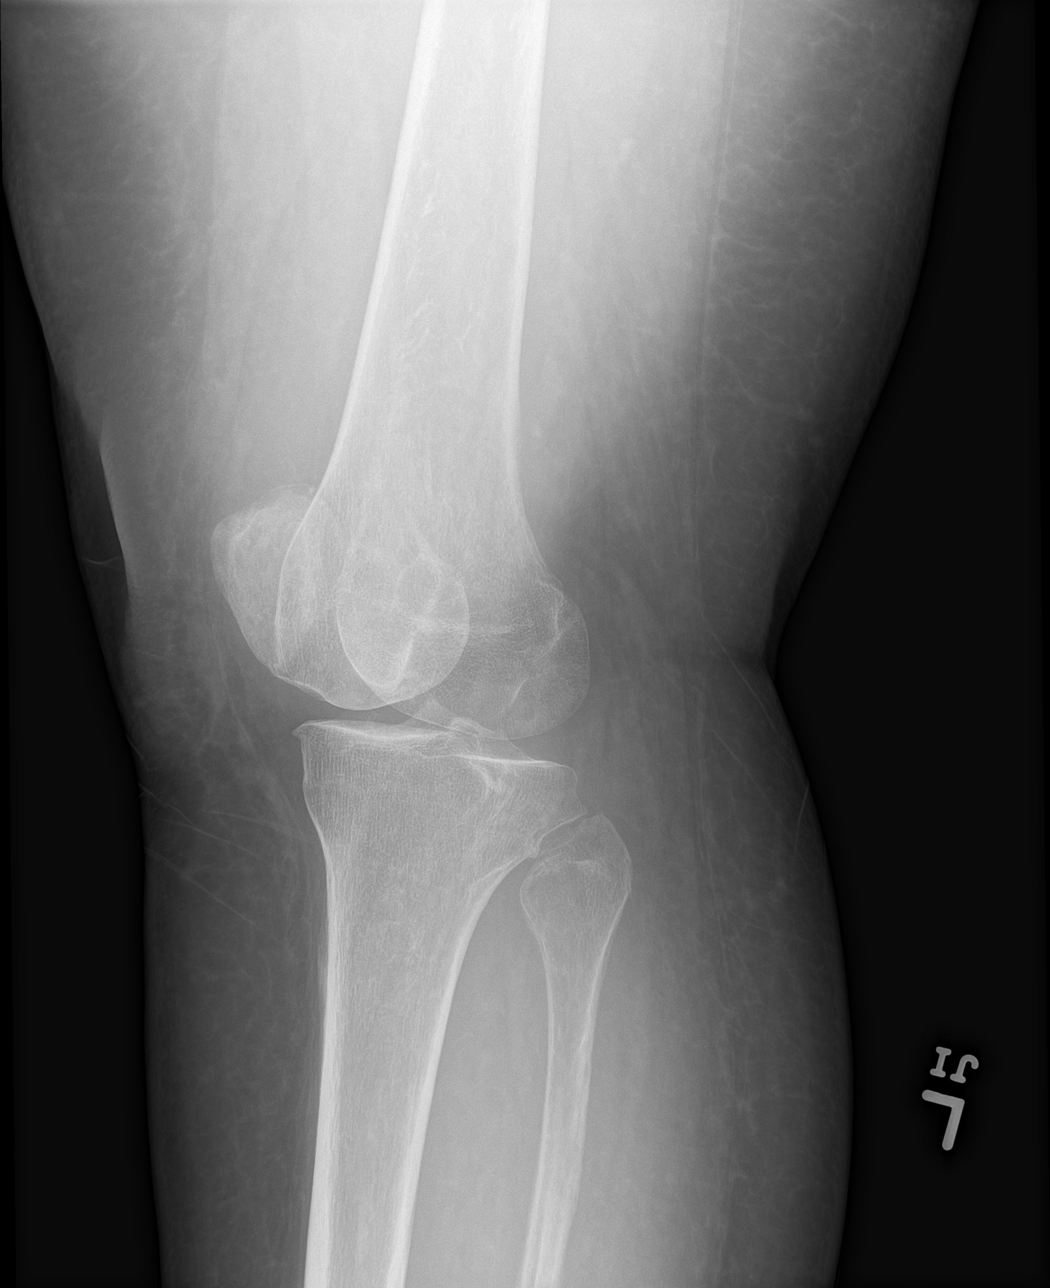

[knee lat]
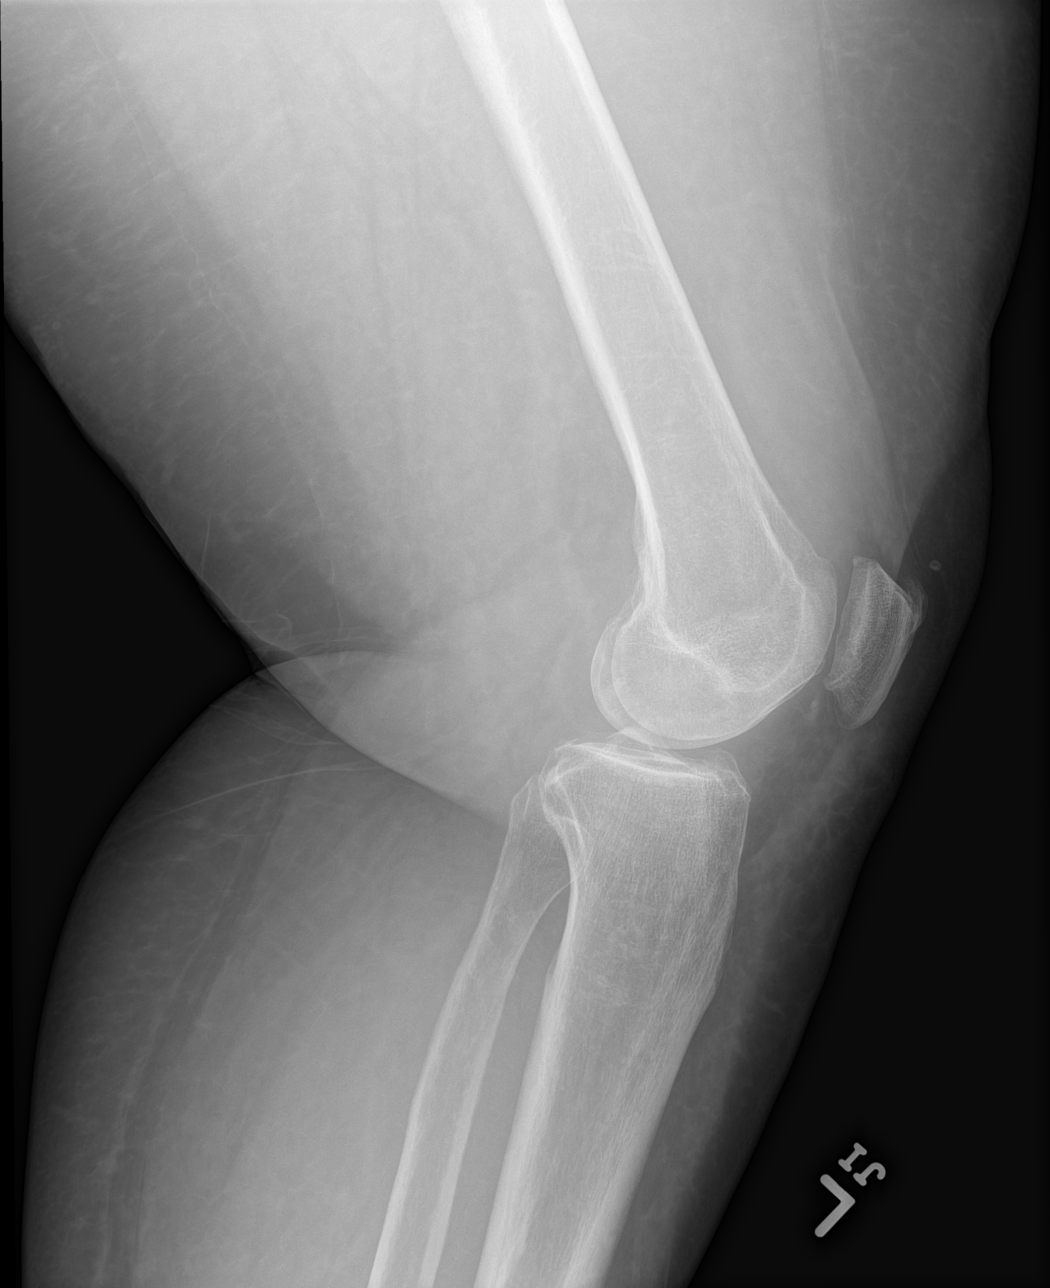

[4 of 4 positions shown; findings below may reference images not displayed]

FINDINGS: No fracture of the proximal tibia or distal femur. Patella is
normal. Small suprapatellar joint effusion.
IMPRESSION: 1. Suprapatellar joint effusion.
2. No acute osseous findings

## 2023-02-13 ENCOUNTER — Other Ambulatory Visit: Payer: Self-pay | Admitting: Family Medicine

## 2023-02-13 DIAGNOSIS — E876 Hypokalemia: Secondary | ICD-10-CM

## 2023-03-21 ENCOUNTER — Other Ambulatory Visit: Payer: Self-pay | Admitting: Family Medicine

## 2023-03-21 DIAGNOSIS — E876 Hypokalemia: Secondary | ICD-10-CM

## 2023-03-23 ENCOUNTER — Encounter: Payer: Self-pay | Admitting: Family Medicine

## 2023-03-23 NOTE — Telephone Encounter (Signed)
 LMTCB TO SCHEDULE APPT LETTER MAILED

## 2023-03-23 NOTE — Telephone Encounter (Signed)
 Stacks pt NTBS 30-d given 02/16/23

## 2023-04-23 ENCOUNTER — Other Ambulatory Visit: Payer: Self-pay | Admitting: Family Medicine

## 2023-04-23 DIAGNOSIS — I10 Essential (primary) hypertension: Secondary | ICD-10-CM

## 2023-04-28 ENCOUNTER — Ambulatory Visit: Payer: Self-pay | Admitting: Family Medicine

## 2023-04-28 ENCOUNTER — Telehealth: Payer: Self-pay | Admitting: Family Medicine

## 2023-04-28 ENCOUNTER — Encounter: Payer: Self-pay | Admitting: Family Medicine

## 2023-04-28 VITALS — BP 138/93 | HR 73 | Temp 98.0°F | Ht 65.0 in | Wt 254.0 lb

## 2023-04-28 DIAGNOSIS — F32A Depression, unspecified: Secondary | ICD-10-CM

## 2023-04-28 DIAGNOSIS — I10 Essential (primary) hypertension: Secondary | ICD-10-CM

## 2023-04-28 DIAGNOSIS — R0683 Snoring: Secondary | ICD-10-CM

## 2023-04-28 DIAGNOSIS — Z23 Encounter for immunization: Secondary | ICD-10-CM | POA: Diagnosis not present

## 2023-04-28 DIAGNOSIS — E876 Hypokalemia: Secondary | ICD-10-CM | POA: Diagnosis not present

## 2023-04-28 MED ORDER — PANTOPRAZOLE SODIUM 40 MG PO TBEC: 40.0000 mg | DELAYED_RELEASE_TABLET | Freq: Every day | ORAL | 1 refills | Status: DC

## 2023-04-28 MED ORDER — MECLIZINE HCL 25 MG PO TABS: 25.0000 mg | ORAL_TABLET | Freq: Three times a day (TID) | ORAL | 1 refills | Status: DC | PRN

## 2023-04-28 MED ORDER — POTASSIUM CHLORIDE CRYS ER 20 MEQ PO TBCR: EXTENDED_RELEASE_TABLET | ORAL | 0 refills | Status: DC

## 2023-04-28 MED ORDER — AMLODIPINE BESYLATE 10 MG PO TABS: 10.0000 mg | ORAL_TABLET | Freq: Every day | ORAL | 0 refills | Status: DC

## 2023-04-28 MED ORDER — CARVEDILOL 12.5 MG PO TABS: ORAL_TABLET | ORAL | 1 refills | Status: DC

## 2023-04-28 MED ORDER — DULOXETINE HCL 60 MG PO CPEP: ORAL_CAPSULE | ORAL | 1 refills | Status: DC

## 2023-04-28 MED ORDER — FUROSEMIDE 20 MG PO TABS
20.0000 mg | ORAL_TABLET | Freq: Every day | ORAL | 1 refills | Status: DC
Start: 2023-04-28 — End: 2023-10-20

## 2023-04-28 NOTE — Progress Notes (Signed)
 Subjective:  Patient ID: Kelli Thompson, female    DOB: 05-18-1965  Age: 58 y.o. MRN: [id number]  CC: skin (Mole or skin tag on face. ), Hypertension (Bp goes up and heart races at night for the last few weeks. Taking BP meds. ), and Edema (Swelling in both legs. Leg cramps. Was taken off of furosemide. )   HPI Kelli Thompson presents for palpitations noted above.  She is also notes that all of the new symptoms as above have been happening since she was taken off of furosemide.  Snores heavily. Awakens tired. Falls asleep easy in the day. Drinking coffee and tea.   Patient in for follow-up of GERD. Currently asymptomatic taking  PPI daily. There is no chest pain or heartburn. No hematemesis and no melena. No dysphagia or choking. Onset is remote. Progression is stable. Complicating factors, none.   presents for  follow-up of hypertension. Patient has no history of headache chest pain or shortness of breath or recent cough. Patient also denies symptoms of TIA such as focal numbness or weakness. Patient denies side effects from medication. States taking it regularly.      04/28/2023   11:40 AM 04/28/2023   10:45 AM 08/21/2022    4:03 PM  Depression screen PHQ 2/9  Decreased Interest 1 0 0  Down, Depressed, Hopeless 1 0 0  PHQ - 2 Score 2 0 0  Altered sleeping 3 3   Tired, decreased energy 2 2   Change in appetite 2 0   Feeling bad or failure about yourself  0 0   Trouble concentrating 0 0   Moving slowly or fidgety/restless 0 0   Suicidal thoughts 0    PHQ-9 Score 9 5   Difficult doing work/chores Somewhat difficult Not difficult at all     History Kelli Thompson has a past medical history of Anxiety, Arthritis, Depression, GERD (gastroesophageal reflux disease), Hypertension, Pancreatitis, Peri-menopause (02/28/2014), Urinary frequency (01/30/2014), and Urinary incontinence, mixed (01/30/2014).   She has a past surgical history that includes Cesarean section; Cholecystectomy; Appendectomy;  Abdominal hysterectomy; Colonoscopy with propofol (N/A, 02/02/2020); Breast biopsy (Right, 02/20/2020); cervical discectomy and fusion; and Neck surgery.   Her family history includes Alcohol abuse in her paternal grandfather; Arthritis in her maternal grandmother; Asthma in her father; Cancer in her paternal grandmother; Congestive Heart Failure in her maternal grandmother; Diabetes in her maternal grandmother and mother; Hypertension in her father and mother; Other in her son; Parkinson's disease in her maternal grandfather; Stroke in her father.She reports that she quit smoking about 10 years ago. Her smoking use included cigarettes. She started smoking about 40 years ago. She has a 15 pack-year smoking history. She has never used smokeless tobacco. She reports that she does not drink alcohol and does not use drugs.    ROS Review of Systems  Objective:  BP (!) 138/93   Pulse 73   Temp 98 F (36.7 C)   Ht 5\' 5"  (1.651 m)   Wt 254 lb (115.2 kg)   SpO2 97%   BMI 42.27 kg/m   BP Readings from Last 3 Encounters:  04/28/23 (!) 138/93  08/21/22 (!) 147/87  04/22/22 135/88    Wt Readings from Last 3 Encounters:  04/28/23 254 lb (115.2 kg)  08/21/22 245 lb 3.2 oz (111.2 kg)  04/22/22 242 lb 9.6 oz (110 kg)     Physical Exam Constitutional:      General: She is not in acute distress.    Appearance:  She is well-developed.  Cardiovascular:     Rate and Rhythm: Normal rate and regular rhythm.  Pulmonary:     Breath sounds: Normal breath sounds.  Musculoskeletal:        General: Swelling (2-3+ bilateral legs) present. Normal range of motion.     Right lower leg: Edema present.     Left lower leg: Edema present.  Skin:    General: Skin is warm and dry.  Neurological:     Mental Status: She is alert and oriented to person, place, and time.      Assessment & Plan:  Immunization due -     Varicella-zoster vaccine IM -     CBC with Differential/Platelet -     CMP14+EGFR -      Lipid panel  Depression, unspecified depression type -     DULoxetine HCl; TAKE ONE CAPSULE BY MOUTH DAILY WITH SUPPER  Dispense: 90 capsule; Refill: 1 -     CBC with Differential/Platelet -     CMP14+EGFR -     Lipid panel  Essential hypertension -     Carvedilol; TAKE 1 TABLET BY MOUTH TWICE A DAY  Dispense: 180 tablet; Refill: 1 -     amLODIPine Besylate; Take 1 tablet (10 mg total) by mouth daily. for blood pressure  Dispense: 90 tablet; Refill: 0 -     CBC with Differential/Platelet -     CMP14+EGFR -     Lipid panel  Hypokalemia -     Potassium Chloride Crys ER; TAKE 1 TABLET BY MOUTH DAILY **NEEDS TO BE SEEN BEFORE NEXT REFILL**  Dispense: 30 tablet; Refill: 0 -     CBC with Differential/Platelet -     CMP14+EGFR -     Lipid panel  Snores -     Ambulatory referral to Sleep Studies -     CBC with Differential/Platelet -     CMP14+EGFR -     Lipid panel  Other orders -     Pantoprazole Sodium; Take 1 tablet (40 mg total) by mouth daily.  Dispense: 90 tablet; Refill: 1 -     Meclizine HCl; Take 1 tablet (25 mg total) by mouth 3 (three) times daily as needed for dizziness.  Dispense: 30 tablet; Refill: 1 -     Furosemide; Take 1 tablet (20 mg total) by mouth daily. For swelling  Dispense: 90 tablet; Refill: 1  DASH for BP, edema   Follow-up: Return in about 1 month (around 05/28/2023).  Mechele Claude, M.D.

## 2023-04-28 NOTE — Patient Instructions (Signed)

## 2023-04-29 LAB — CBC WITH DIFFERENTIAL/PLATELET
Basophils Absolute: 0 10*3/uL (ref 0.0–0.2)
Basos: 1 %
EOS (ABSOLUTE): 0.4 10*3/uL (ref 0.0–0.4)
Eos: 4 %
Hematocrit: 42.2 % (ref 34.0–46.6)
Hemoglobin: 13.7 g/dL (ref 11.1–15.9)
Immature Grans (Abs): 0 10*3/uL (ref 0.0–0.1)
Immature Granulocytes: 0 %
Lymphocytes Absolute: 2.1 10*3/uL (ref 0.7–3.1)
Lymphs: 25 %
MCH: 26.6 pg (ref 26.6–33.0)
MCHC: 32.5 g/dL (ref 31.5–35.7)
MCV: 82 fL (ref 79–97)
Monocytes Absolute: 0.4 10*3/uL (ref 0.1–0.9)
Monocytes: 5 %
Neutrophils Absolute: 5.5 10*3/uL (ref 1.4–7.0)
Neutrophils: 65 %
Platelets: 232 10*3/uL (ref 150–450)
RBC: 5.15 x10E6/uL (ref 3.77–5.28)
RDW: 15.1 % (ref 11.7–15.4)
WBC: 8.4 10*3/uL (ref 3.4–10.8)

## 2023-04-29 LAB — LIPID PANEL
Chol/HDL Ratio: 5.1 ratio — ABNORMAL HIGH (ref 0.0–4.4)
Cholesterol, Total: 172 mg/dL (ref 100–199)
HDL: 34 mg/dL — ABNORMAL LOW (ref >39)
LDL Chol Calc (NIH): 120 mg/dL — ABNORMAL HIGH (ref 0–99)
Triglycerides: 94 mg/dL (ref 0–149)
VLDL Cholesterol Cal: 18 mg/dL (ref 5–40)

## 2023-04-29 LAB — CMP14+EGFR
ALT: 18 IU/L (ref 0–32)
AST: 17 IU/L (ref 0–40)
Albumin: 4.2 g/dL (ref 3.8–4.9)
Alkaline Phosphatase: 117 IU/L (ref 44–121)
BUN/Creatinine Ratio: 19 (ref 9–23)
BUN: 16 mg/dL (ref 6–24)
Bilirubin Total: 0.4 mg/dL (ref 0.0–1.2)
CO2: 25 mmol/L (ref 20–29)
Calcium: 9.1 mg/dL (ref 8.7–10.2)
Chloride: 101 mmol/L (ref 96–106)
Creatinine, Ser: 0.86 mg/dL (ref 0.57–1.00)
Globulin, Total: 2.6 g/dL (ref 1.5–4.5)
Glucose: 129 mg/dL — ABNORMAL HIGH (ref 70–99)
Potassium: 3.6 mmol/L (ref 3.5–5.2)
Sodium: 142 mmol/L (ref 134–144)
Total Protein: 6.8 g/dL (ref 6.0–8.5)
eGFR: 79 mL/min/{1.73_m2} (ref >59)

## 2023-04-30 ENCOUNTER — Other Ambulatory Visit: Payer: Self-pay

## 2023-04-30 DIAGNOSIS — Z1231 Encounter for screening mammogram for malignant neoplasm of breast: Secondary | ICD-10-CM

## 2023-04-30 NOTE — Telephone Encounter (Signed)
 Called patient and gave contact information for her to call [facility] to schedule the appointment

## 2023-05-05 LAB — SPECIMEN STATUS REPORT

## 2023-05-05 LAB — HGB A1C W/O EAG: Hgb A1c MFr Bld: 6.8 % — ABNORMAL HIGH (ref 4.8–5.6)

## 2023-05-14 ENCOUNTER — Ambulatory Visit (HOSPITAL_COMMUNITY)
Admission: RE | Admit: 2023-05-14 | Discharge: 2023-05-14 | Disposition: A | Discharge status: Home / Self Care | Payer: Self-pay | Source: Ambulatory Visit | Attending: Family Medicine | Admitting: Family Medicine

## 2023-05-14 DIAGNOSIS — Z1231 Encounter for screening mammogram for malignant neoplasm of breast: Secondary | ICD-10-CM | POA: Insufficient documentation

## 2023-05-18 ENCOUNTER — Other Ambulatory Visit (HOSPITAL_COMMUNITY): Payer: Self-pay | Admitting: Family Medicine

## 2023-05-18 DIAGNOSIS — R928 Other abnormal and inconclusive findings on diagnostic imaging of breast: Secondary | ICD-10-CM

## 2023-05-25 ENCOUNTER — Other Ambulatory Visit: Payer: Self-pay | Admitting: Family Medicine

## 2023-05-25 DIAGNOSIS — E876 Hypokalemia: Secondary | ICD-10-CM

## 2023-05-26 ENCOUNTER — Ambulatory Visit (HOSPITAL_COMMUNITY)
Admission: RE | Admit: 2023-05-26 | Discharge: 2023-05-26 | Disposition: A | Discharge status: Home / Self Care | Payer: Self-pay | Source: Ambulatory Visit | Attending: Family Medicine | Admitting: Family Medicine

## 2023-05-26 ENCOUNTER — Other Ambulatory Visit (HOSPITAL_COMMUNITY): Payer: Self-pay | Admitting: Family Medicine

## 2023-05-26 DIAGNOSIS — R928 Other abnormal and inconclusive findings on diagnostic imaging of breast: Secondary | ICD-10-CM | POA: Diagnosis present

## 2023-05-27 ENCOUNTER — Other Ambulatory Visit (HOSPITAL_COMMUNITY): Payer: Self-pay | Admitting: Family Medicine

## 2023-05-27 DIAGNOSIS — R928 Other abnormal and inconclusive findings on diagnostic imaging of breast: Secondary | ICD-10-CM

## 2023-06-01 ENCOUNTER — Encounter: Payer: Self-pay | Admitting: Family Medicine

## 2023-06-01 ENCOUNTER — Ambulatory Visit: Admitting: Family Medicine

## 2023-06-01 VITALS — BP 136/89 | HR 69 | Temp 98.1°F | Ht 65.0 in | Wt 243.0 lb

## 2023-06-01 DIAGNOSIS — I10 Essential (primary) hypertension: Secondary | ICD-10-CM

## 2023-06-01 DIAGNOSIS — E119 Type 2 diabetes mellitus without complications: Secondary | ICD-10-CM

## 2023-06-01 DIAGNOSIS — N6341 Unspecified lump in right breast, subareolar: Secondary | ICD-10-CM | POA: Diagnosis not present

## 2023-06-01 DIAGNOSIS — E1159 Type 2 diabetes mellitus with other circulatory complications: Secondary | ICD-10-CM | POA: Diagnosis not present

## 2023-06-01 DIAGNOSIS — E876 Hypokalemia: Secondary | ICD-10-CM

## 2023-06-01 MED ORDER — AMLODIPINE BESYLATE 10 MG PO TABS: 10.0000 mg | ORAL_TABLET | Freq: Every day | ORAL | 0 refills | Status: DC

## 2023-06-01 MED ORDER — DAPAGLIFLOZIN PROPANEDIOL 10 MG PO TABS: 10.0000 mg | ORAL_TABLET | Freq: Every day | ORAL | 3 refills | Status: DC

## 2023-06-01 MED ORDER — POTASSIUM CHLORIDE CRYS ER 20 MEQ PO TBCR: EXTENDED_RELEASE_TABLET | ORAL | 0 refills | Status: DC

## 2023-06-01 NOTE — Patient Instructions (Signed)

## 2023-06-01 NOTE — Progress Notes (Signed)
 Subjective:  Patient ID: Kelli Thompson, female    DOB: 11-May-1965  Age: 58 y.o. MRN: [id number]  CC: Nevus (Mole removal from face)   HPI. Kelli Thompson presents for diabetic education she is a new diabetic and has not learned about to carb control.  She also has not yet started on patient she is scared of metformin.  She stated today that everyone she has talked to tells her to never started on metformin.  Apparently this is based on reports from the lay press.  We also reviewed her upcoming right breast biopsy.  This is felt to likely represent a dilated gland or a cyst.  However there was some level of suspicion that led to recommendation for biopsy that is pending for later this week.     06/01/2023   11:30 AM 04/28/2023   11:40 AM 04/28/2023   10:45 AM  Depression screen PHQ 2/9  Decreased Interest 0 1 0  Down, Depressed, Hopeless 0 1 0  PHQ - 2 Score 0 2 0  Altered sleeping 3 3 3   Tired, decreased energy 1 2 2   Change in appetite 0 2 0  Feeling bad or failure about yourself  0 0 0  Trouble concentrating 0 0 0  Moving slowly or fidgety/restless 0 0 0  Suicidal thoughts 0 0   PHQ-9 Score 4 9 5   Difficult doing work/chores Not difficult at all Somewhat difficult Not difficult at all    History Kelli Thompson has a past medical history of Anxiety, Arthritis, Depression, GERD (gastroesophageal reflux disease), Hypertension, Pancreatitis, Peri-menopause (02/28/2014), Urinary frequency (01/30/2014), and Urinary incontinence, mixed (01/30/2014).   She has a past surgical history that includes Cesarean section; Cholecystectomy; Appendectomy; Abdominal hysterectomy; Colonoscopy with propofol  (N/A, 02/02/2020); Breast biopsy (Right, 02/20/2020); cervical discectomy and fusion; and Neck surgery.   Her family history includes Alcohol abuse in her paternal grandfather; Arthritis in her maternal grandmother; Asthma in her father; Cancer in her paternal grandmother; Congestive Heart Failure in her  maternal grandmother; Diabetes in her maternal grandmother and mother; Hypertension in her father and mother; Other in her son; Parkinson's disease in her maternal grandfather; Stroke in her father.She reports that she quit smoking about 10 years ago. Her smoking use included cigarettes. She started smoking about 40 years ago. She has a 15 pack-year smoking history. She has never used smokeless tobacco. She reports that she does not drink alcohol and does not use drugs.    ROS Review of Systems  Constitutional: Negative.   HENT: Negative.    Eyes:  Negative for visual disturbance.  Respiratory:  Negative for shortness of breath.   Cardiovascular:  Negative for chest pain.  Gastrointestinal:  Negative for abdominal pain.  Musculoskeletal:  Negative for arthralgias.    Objective:  BP 136/89   Pulse 69   Temp 98.1 F (36.7 C)   Ht 5\' 5"  (1.651 m)   Wt 243 lb (110.2 kg)   SpO2 95%   BMI 40.44 kg/m   BP Readings from Last 3 Encounters:  06/01/23 136/89  04/28/23 (!) 138/93  08/21/22 (!) 147/87    Wt Readings from Last 3 Encounters:  06/01/23 243 lb (110.2 kg)  04/28/23 254 lb (115.2 kg)  08/21/22 245 lb 3.2 oz (111.2 kg)     Physical Exam Constitutional:      General: She is not in acute distress.    Appearance: She is well-developed.  Cardiovascular:     Rate and Rhythm: Normal rate and  regular rhythm.  Pulmonary:     Breath sounds: Normal breath sounds.  Musculoskeletal:        General: Normal range of motion.  Skin:    General: Skin is warm and dry.  Neurological:     Mental Status: She is alert and oriented to person, place, and time.      Assessment & Plan:  Subareolar mass of right breast  Hypokalemia -     Potassium Chloride  Crys ER; TAKE 1 TABLET BY MOUTH DAILY;  Dispense: 30 tablet; Refill: 0  Essential hypertension -     amLODIPine  Besylate; Take 1 tablet (10 mg total) by mouth daily. for blood pressure  Dispense: 90 tablet; Refill: 0  Other  orders -     Dapagliflozin Propanediol; Take 1 tablet (10 mg total) by mouth daily before breakfast.  Dispense: 90 tablet; Refill: 3   Breast mass to be biopsied soon.  Patient educated on carbohydrate counting as well as simple versus complex carbohydrates and the foods that contain each.  She voiced understanding.  Follow-up: No follow-ups on file.  Roise Cleaver, M.D.

## 2023-06-02 ENCOUNTER — Other Ambulatory Visit (HOSPITAL_COMMUNITY): Payer: Self-pay

## 2023-06-02 ENCOUNTER — Telehealth: Payer: Self-pay

## 2023-06-02 NOTE — Telephone Encounter (Signed)
 Prior Authorization form/request SENT TO [facility] FOR FARXIGA  asks a question that requires your assistance. Please see the question below and advise accordingly. The PA will not be submitted until the necessary information is received.  What is patients PRIMARY DIAGNOSIS FOR FARXIGA?

## 2023-06-02 NOTE — Telephone Encounter (Signed)
 Pharmacy Patient Advocate Encounter   Received notification from Onbase that prior authorization for Farxiga 10MG  tablets is required/requested.   Insurance verification completed.   The patient is insured through [facility] .   Farxiga is only approved in the [place]  for diabetes and to reduce the risk of cardiovascular (CV) death and hospitalization for heart failure in adults with heart failure (NYHA class II-IV) with reduced ejection fraction (HFrEF) with and without type 2 diabetes (T2D). A prior auth willnot be done at this time. Please consider changing therapies.

## 2023-06-03 ENCOUNTER — Other Ambulatory Visit (HOSPITAL_COMMUNITY): Payer: Self-pay

## 2023-06-03 NOTE — Telephone Encounter (Signed)
 Pharmacy Patient Advocate Encounter  Received notification from [facility] [employer] that Prior Authorization for  FARXIGA TAB 10MG  must have step therapy documented that pt must have tried and failed Metformin before PA can be approved if you would like to proceed with PA of FARXIGA PLEASE LET PA TEAM KNOW THANKS.  KEY [id number]   PLEASE SEE REJECTION FROM TEST CLAIM BELOW

## 2023-06-03 NOTE — Telephone Encounter (Signed)
 MyChart message sent with denial information. LS

## 2023-06-03 NOTE — Telephone Encounter (Signed)
 Please let pt. Know that she will have to t metformin or her insurance will not cover her diabetic medicines.

## 2023-06-04 ENCOUNTER — Encounter (HOSPITAL_COMMUNITY): Payer: Self-pay

## 2023-06-04 ENCOUNTER — Ambulatory Visit (HOSPITAL_COMMUNITY)
Admission: RE | Admit: 2023-06-04 | Discharge: 2023-06-04 | Disposition: A | Discharge status: Home / Self Care | Payer: Self-pay | Source: Ambulatory Visit | Attending: Family Medicine | Admitting: Family Medicine

## 2023-06-04 DIAGNOSIS — R928 Other abnormal and inconclusive findings on diagnostic imaging of breast: Secondary | ICD-10-CM | POA: Insufficient documentation

## 2023-06-04 HISTORY — PX: BREAST BIOPSY: SHX20

## 2023-06-04 MED ORDER — LIDOCAINE-EPINEPHRINE (PF) 1 %-1:200000 IJ SOLN
10.0000 mL | Freq: Once | INTRAMUSCULAR | Status: AC
  Administered 2023-06-04: 10 mL via INTRADERMAL

## 2023-06-04 MED ORDER — LIDOCAINE-EPINEPHRINE (PF) 1 %-1:200000 IJ SOLN
INTRAMUSCULAR | Status: AC
  Filled 2023-06-04: qty 30

## 2023-06-04 MED ORDER — LIDOCAINE HCL (PF) 2 % IJ SOLN
10.0000 mL | Freq: Once | INTRAMUSCULAR | Status: AC
  Administered 2023-06-04: 10 mL

## 2023-06-04 MED ORDER — LIDOCAINE HCL (PF) 2 % IJ SOLN
INTRAMUSCULAR | Status: AC
  Filled 2023-06-04: qty 10

## 2023-06-05 LAB — SURGICAL PATHOLOGY

## 2023-06-08 ENCOUNTER — Telehealth: Payer: Self-pay | Admitting: Neurology

## 2023-06-08 ENCOUNTER — Other Ambulatory Visit: Payer: Self-pay | Admitting: Family Medicine

## 2023-06-08 MED ORDER — METFORMIN HCL ER 500 MG PO TB24
500.0000 mg | ORAL_TABLET | Freq: Every day | ORAL | 2 refills | Status: DC
Start: 2023-06-08 — End: 2023-08-31

## 2023-06-08 NOTE — Telephone Encounter (Signed)
 Please let the patient know that I sent their prescription to their pharmacy. Thanks, WS

## 2023-06-08 NOTE — Telephone Encounter (Unsigned)
 Copied from CRM [id number]. Topic: Clinical - Medication Prior Auth >> Jun 08, 2023  9:23 AM Tisa Forester wrote: Reason for CRM: patient calling on issue with the FARXIGA  TAB 10MG  , her insurance will not cover have to start out with metFORMIN or insulin injection first  Please reach out to patient  [phone] to let know what going on with the medication

## 2023-06-08 NOTE — Telephone Encounter (Signed)
Completed  -LS

## 2023-06-08 NOTE — Telephone Encounter (Signed)
 request to cancel appointment, conflict with another appointment

## 2023-06-11 ENCOUNTER — Institutional Professional Consult (permissible substitution): Admitting: Neurology

## 2023-06-22 ENCOUNTER — Other Ambulatory Visit: Payer: Self-pay

## 2023-06-22 ENCOUNTER — Emergency Department (HOSPITAL_BASED_OUTPATIENT_CLINIC_OR_DEPARTMENT_OTHER): Admission: EM | Admit: 2023-06-22 | Discharge: 2023-06-22 | Disposition: A | Discharge status: Home / Self Care

## 2023-06-22 ENCOUNTER — Emergency Department (HOSPITAL_BASED_OUTPATIENT_CLINIC_OR_DEPARTMENT_OTHER): Admitting: Radiology

## 2023-06-22 DIAGNOSIS — W19XXXA Unspecified fall, initial encounter: Secondary | ICD-10-CM

## 2023-06-22 DIAGNOSIS — Z79899 Other long term (current) drug therapy: Secondary | ICD-10-CM | POA: Diagnosis not present

## 2023-06-22 DIAGNOSIS — M25572 Pain in left ankle and joints of left foot: Secondary | ICD-10-CM | POA: Diagnosis not present

## 2023-06-22 DIAGNOSIS — W01198A Fall on same level from slipping, tripping and stumbling with subsequent striking against other object, initial encounter: Secondary | ICD-10-CM | POA: Insufficient documentation

## 2023-06-22 DIAGNOSIS — M25562 Pain in left knee: Secondary | ICD-10-CM | POA: Insufficient documentation

## 2023-06-22 DIAGNOSIS — M79672 Pain in left foot: Secondary | ICD-10-CM | POA: Insufficient documentation

## 2023-06-22 DIAGNOSIS — I10 Essential (primary) hypertension: Secondary | ICD-10-CM

## 2023-06-22 DIAGNOSIS — M25511 Pain in right shoulder: Secondary | ICD-10-CM | POA: Insufficient documentation

## 2023-06-22 NOTE — ED Provider Notes (Signed)
 [place] EMERGENCY DEPARTMENT AT [facility] Provider Note   CSN: [id number] Arrival date & time: 06/22/23  1745     History  Chief Complaint  Patient presents with   Kelli Thompson is a 58 y.o. female.   Fall   58 year old female presents emergency department after a fall.  Fall occurred 3 days ago.  Was walking when she tripped on a hose falling forward.  States she landed on the concrete with her left knee hit the ground first.  States that she then tried to stop her fall with her right arm which was outstretched.  Since then, has had left knee pain, left ankle/foot pain, right shoulder pain.  Denies any trauma to head, LOC, blood thinner use.  Has tried over-the-counter medications with some improvement in symptoms.  Presents to the emergency department due to continued symptoms and concern for possible fracture.  Past medical history significant for hypertension, cervical disc disease with myelopathy, chronic low back pain  Home Medications Prior to Admission medications   Medication Sig Start Date End Date Taking? Authorizing Provider  amLODipine  (NORVASC ) 10 MG tablet Take 1 tablet (10 mg total) by mouth daily. for blood pressure 06/01/23   Roise Cleaver, MD  carvedilol  (COREG ) 12.5 MG tablet TAKE 1 TABLET BY MOUTH TWICE A DAY 04/28/23   Roise Cleaver, MD  DULoxetine  (CYMBALTA ) 60 MG capsule TAKE ONE CAPSULE BY MOUTH DAILY WITH SUPPER 04/28/23   Roise Cleaver, MD  furosemide  (LASIX ) 20 MG tablet Take 1 tablet (20 mg total) by mouth daily. For swelling 04/28/23   Roise Cleaver, MD  meclizine  (ANTIVERT ) 25 MG tablet Take 1 tablet (25 mg total) by mouth 3 (three) times daily as needed for dizziness. 04/28/23   Roise Cleaver, MD  metFORMIN  (GLUCOPHAGE -XR) 500 MG 24 hr tablet Take 1 tablet (500 mg total) by mouth daily with breakfast. 06/08/23   Roise Cleaver, MD  potassium chloride  SA (KLOR-CON  M20) 20 MEQ tablet TAKE 1 TABLET BY MOUTH DAILY; 06/01/23   Roise Cleaver, MD      Allergies    Ace inhibitors and Other    Review of Systems   Review of Systems  All other systems reviewed and are negative.   Physical Exam Updated Vital Signs BP (!) 185/112   Pulse 97   Temp 98.9 F (37.2 C) (Oral)   Resp 19   Ht 5\' 5"  (1.651 m)   Wt 106.1 kg   SpO2 97%   BMI 38.94 kg/m  Physical Exam Vitals and nursing note reviewed.  Constitutional:      General: She is not in acute distress.    Appearance: She is well-developed.  HENT:     Head: Normocephalic and atraumatic.  Eyes:     Conjunctiva/sclera: Conjunctivae normal.  Cardiovascular:     Rate and Rhythm: Normal rate and regular rhythm.     Heart sounds: No murmur heard. Pulmonary:     Effort: Pulmonary effort is normal. No respiratory distress.     Breath sounds: Normal breath sounds.  Abdominal:     Palpations: Abdomen is soft.     Tenderness: There is no abdominal tenderness.  Musculoskeletal:        General: No swelling.     Cervical back: Neck supple.     Comments: No midline tenderness cervical, thoracic, lumbar spine.  No chest wall tenderness.  Patient is tender to palpation right proximal humerus but is able to range her shoulder fully.  Pain elicited with empty can, Jobes, liftoff able to perform these maneuvers.  Patient with tender palpation left proximal femur laterally, left proximal tibia anteriorly, left lateral malleolus anteriorly.  Healing ecchymosis left proximal tibia anteriorly as well as left lateral ankle.  No other tenderness of bilateral upper or lower extremities or appreciable traumatic injury.  Radial, pedal and posttibial DP pulses 2+ bilaterally.  Skin:    General: Skin is warm and dry.     Capillary Refill: Capillary refill takes less than 2 seconds.  Neurological:     Mental Status: She is alert.  Psychiatric:        Mood and Affect: Mood normal.    ED Results / Procedures / Treatments   Labs (all labs ordered are listed, but only abnormal results  are displayed) Labs Reviewed - No data to display  EKG None  Radiology No results found.  Procedures Procedures    Medications Ordered in ED Medications - No data to display  ED Course/ Medical Decision Making/ A&P                                 Medical Decision Making Amount and/or Complexity of Data Reviewed Radiology: ordered.   This patient presents to the ED for concern of fall, this involves an extensive number of treatment options, and is a complaint that carries with it a high risk of complications and morbidity.  The differential diagnosis includes CVA, fracture, strain/pain, dislocation, ligamentous substance injury, neurovascular compromise,   Co morbidities that complicate the patient evaluation  See HPI   Additional history obtained:  Additional history obtained from EMR External records from outside source obtained and reviewed including hospital records   Lab Tests:  N/a   Imaging Studies ordered:  I ordered imaging studies including left foot/ankle x-ray, left knee x-ray, pelvis x-ray with left hip, left shoulder/humerus x-ray I independently visualized and interpreted imaging which showed  Left foot/ankle x-ray: No acute osseous abnormality.  Subcutaneous edema.  Osteoarthritis ankle, hindfoot, midfoot. Left knee x-ray: Tricompartmental osteoarthritis.  No acute fracture or dislocation. Pelvis x-ray with left hip: No acute fracture or dislocation.  Mild glenohumeral osteoarthritis Right shoulder/humerus x-ray: No fracture or dislocation. I agree with the radiologist interpretation   Cardiac Monitoring: / EKG:  N/a   Consultations Obtained:  N/a   Problem List / ED Course / Critical interventions / Medication management  Fall Reevaluation of the patient showed that the patient stayed the same I have reviewed the patients home medicines and have made adjustments as needed   Social Determinants of Health:  Former cigarette use.   Denies illicit drug use.   Test / Admission - Considered:  Fall Vitals signs significant for hypertension blood pressure 176/74; patient has not been compliant with her blood pressure medications that she has not had since yesterday morning.  Will recommend continuing on blood pressure medications and follow-up with PCP for reevaluation.. Otherwise within normal range and stable throughout visit. Imaging studies significant for: see above 58 year old female presents emergency department after a fall.  Fall occurred 3 days ago.  Was walking when she tripped on a hose falling forward.  States she landed on the concrete with her left knee hit the ground first.  States that she then tried to stop her fall with her right arm which was outstretched.  Since then, has had left knee pain, left ankle/foot pain, right shoulder pain.  Denies  any trauma to head, LOC, blood thinner use.  Has tried over-the-counter medications with some improvement in symptoms.  Presents to the emergency department due to continued symptoms and concern for possible fracture. On exam, tender palpation right shoulder, left hip, left knee, left ankle/foot as above.  X-rays obtained of areas negative for any acute osseous abnormality.  Patient reassured by findings.  Recommend symptomatic therapy as described in AVS with close follow-up with PCP in the outpatient setting for reassessment.  Treatment plan discussed with patient and she acknowledged understanding was agreeable to said plan.  Patient overall well-appearing, afebrile in no acute distress. Worrisome signs and symptoms were discussed with the patient, and the patient acknowledged understanding to return to the ED if noticed. Patient was stable upon discharge.          Final Clinical Impression(s) / ED Diagnoses Final diagnoses:  None    Rx / DC Orders ED Discharge Orders     None         [place] Butter, [place] 06/22/23 Damon Dumas, MD 06/22/23  2255

## 2023-06-22 NOTE — ED Triage Notes (Addendum)
 Pt POV reporting persistent L knee and R arm pain after tripping over hose a few days ago. Denies hitting head or LOC. Hypertensive in triage, has not taken BP meds since yesterday AM.

## 2023-06-22 NOTE — ED Notes (Signed)
Reviewed discharge instructions and home care with pt. Pt verbalized understanding and had no further questions. Pt exited ED without complications.

## 2023-06-22 NOTE — Discharge Instructions (Addendum)
 As discussed, your workup today was overall reassuring.  X-ray did not show any obvious fracture or dislocation.  Continue take Tylenol , ibuprofen  for treatment of pain.  Ice areas are swollen.  Continue to elevate legs above the level of your heart up with swelling.  I also think you benefit from compression socks to help with your lower extremity swelling as well as a restricted salt diet as we discussed.  Recommend follow-up with your primary care for reassessment.  Please do not hesitate to return if the worrisome signs and symptoms we discussed become apparent.

## 2023-06-22 NOTE — ED Notes (Signed)
Patient transported to X-Ray 

## 2023-07-02 ENCOUNTER — Encounter: Payer: Self-pay | Admitting: General Surgery

## 2023-07-02 ENCOUNTER — Other Ambulatory Visit (HOSPITAL_COMMUNITY): Payer: Self-pay | Admitting: General Surgery

## 2023-07-02 ENCOUNTER — Ambulatory Visit: Admitting: General Surgery

## 2023-07-02 VITALS — BP 137/81 | HR 64 | Temp 98.1°F | Resp 16 | Ht 65.0 in | Wt 241.0 lb

## 2023-07-02 DIAGNOSIS — D241 Benign neoplasm of right breast: Secondary | ICD-10-CM

## 2023-07-02 DIAGNOSIS — R928 Other abnormal and inconclusive findings on diagnostic imaging of breast: Secondary | ICD-10-CM

## 2023-07-02 NOTE — Patient Instructions (Signed)
 Will get you scheduled for radiofrequency tag placement and then plan for surgical excision.  July 1 is when your tag will be placed and your surgery will be July 9.

## 2023-07-02 NOTE — Progress Notes (Unsigned)
 [facility] History and Physical  Reason for Referral:*** Referring Physician: ***  Chief Complaint   Follow-up     Kelli Thompson is a 58 y.o. female.  HPI:   Discussed the use of AI scribe software for clinical note transcription with the patient, who gave verbal consent to proceed.  History of Present Illness      ***.  The *** started *** and has had a duration of ***.  It is associated with ***.  The *** is improved with ***, and is made worse with ***.    Quality*** Context***  Past Medical History:  Diagnosis Date   Anxiety    Arthritis    Depression    GERD (gastroesophageal reflux disease)    Hypertension    Pancreatitis    around 2013; x1 secondary to lisinopril per patient   Peri-menopause 02/28/2014   Urinary frequency 01/30/2014   Urinary incontinence, mixed 01/30/2014    Past Surgical History:  Procedure Laterality Date   ABDOMINAL HYSTERECTOMY     APPENDECTOMY     BREAST BIOPSY Right 02/20/2020   BENIGN FIBROEPITHELIAL LESION/Procedure: BREAST EXCISIONAL BIOPSY AFTER SEED LOCATION;  Surgeon: Awilda Bogus, MD;  Location: [facility];  Service: General;  Laterality: Right;   BREAST BIOPSY Right 06/04/2023   path pending   BREAST BIOPSY Right 06/04/2023   US  RT BREAST BX W LOC DEV 1ST LESION IMG BX SPEC US  GUIDE 06/04/2023 [facility]   BREAST EXCISIONAL BIOPSY Right 01/18/2020   benign hamartoma   cervical discectomy and fusion     CESAREAN SECTION     x2   CHOLECYSTECTOMY     COLONOSCOPY WITH PROPOFOL  N/A 02/02/2020   Procedure: COLONOSCOPY WITH PROPOFOL ;  Surgeon: Vinetta Greening, DO;  Location: [facility];  Service: Endoscopy;  Laterality: N/A;  8:15am   NECK SURGERY      Family History  Problem Relation Age of Onset   Hypertension Mother    Diabetes Mother        borderline   Hypertension Father    Asthma Father    Stroke Father    Diabetes Maternal Grandmother    Congestive Heart Failure Maternal Grandmother     Arthritis Maternal Grandmother    Parkinson's disease Maternal Grandfather    Cancer Paternal Grandmother    Alcohol abuse Paternal Grandfather    Other Son        stomach issues   Colon cancer Neg Hx     Social History   Tobacco Use   Smoking status: Former    Current packs/day: 0.00    Average packs/day: 0.5 packs/day for 30.0 years (15.0 ttl pk-yrs)    Types: Cigarettes    Start date: 03/28/1983    Quit date: 03/27/2013    Years since quitting: 10.2   Smokeless tobacco: Never  Vaping Use   Vaping status: Never Used  Substance Use Topics   Alcohol use: No   Drug use: No    Medications: {medication reviewed/display:3041432} Allergies as of 07/02/2023       Reactions   Ace Inhibitors    pancreatitis   Other    Any of the -prils, manly lisinopril        Medication List        Accurate as of July 02, 2023  1:43 PM. If you have any questions, ask your nurse or doctor.          amLODipine  10 MG tablet Commonly known as: NORVASC  Take 1  tablet (10 mg total) by mouth daily. for blood pressure   carvedilol  12.5 MG tablet Commonly known as: COREG  TAKE 1 TABLET BY MOUTH TWICE A DAY   DULoxetine  60 MG capsule Commonly known as: CYMBALTA  TAKE ONE CAPSULE BY MOUTH DAILY WITH SUPPER   furosemide  20 MG tablet Commonly known as: LASIX  Take 1 tablet (20 mg total) by mouth daily. For swelling   meclizine  25 MG tablet Commonly known as: ANTIVERT  Take 1 tablet (25 mg total) by mouth 3 (three) times daily as needed for dizziness.   metFORMIN  500 MG 24 hr tablet Commonly known as: GLUCOPHAGE -XR Take 1 tablet (500 mg total) by mouth daily with breakfast.   potassium chloride  SA 20 MEQ tablet Commonly known as: Klor-Con  M20 TAKE 1 TABLET BY MOUTH DAILY;         ROS:  {Review of Systems:30496}  Blood pressure 137/81, pulse 64, temperature 98.1 F (36.7 C), temperature source Oral, resp. rate 16, height 5\' 5"  (1.651 m), weight 241 lb (109.3 kg), SpO2  95%.  Physical Exam   Results: CLINICAL DATA:  Screening recall for possible right breast mass.   EXAM: DIGITAL DIAGNOSTIC UNILATERAL RIGHT MAMMOGRAM WITH TOMOSYNTHESIS AND CAD; ULTRASOUND RIGHT BREAST LIMITED   TECHNIQUE: Right digital diagnostic mammography and breast tomosynthesis was performed. The images were evaluated with computer-aided detection. ; Targeted ultrasound examination of the right breast was performed   COMPARISON:  Previous exam(s).   ACR Breast Density Category b: There are scattered areas of fibroglandular density.   FINDINGS: Additional spot compression tomosynthesis images were performed the right breast. There is an oval mass versus portion of a dilated duct the retroareolar right breast measuring 0.7 cm.   Targeted ultrasound of the retroareolar right breast was performed. There is an oval circumscribed hypoechoic mass possibly related to a focally dilated duct in the right breast at 12 o'clock retroareolar measuring 0.4 x 0.4 x 0.9 cm. This is felt to correspond well with the mass seen in the retroareolar right breast at mammography. Normal lymph nodes are present in the right axilla.   IMPRESSION: Indeterminate 0.9 cm mass/focally dilated duct in the right breast at 12 o'clock retroareolar.   RECOMMENDATION: Recommend ultrasound-guided core biopsy of the mass in the right breast at 12 o'clock retroareolar.   I have discussed the findings and recommendations with the patient. If applicable, a reminder letter will be sent to the patient regarding the next appointment.   BI-RADS CATEGORY  4: Suspicious.     Electronically Signed   By: Alger Infield M.D.   On: 05/26/2023 09:57  CLINICAL DATA:  Patient presents for ultrasound-guided core biopsy of a 0.9 cm mass in the right breast at 12 o'clock retroareolar.   EXAM: ULTRASOUND GUIDED RIGHT BREAST CORE NEEDLE BIOPSY   COMPARISON:  Previous exam(s).   PROCEDURE: I met with the patient and  we discussed the procedure of ultrasound-guided biopsy, including benefits and alternatives. We discussed the high likelihood of a successful procedure. We discussed the risks of the procedure, including infection, bleeding, tissue injury, clip migration, and inadequate sampling. Informed written consent was given. The usual time-out protocol was performed immediately prior to the procedure.   Lesion quadrant: Upper inner   Using sterile technique and 1% Lidocaine  as local anesthetic, under direct ultrasound visualization, a 14 gauge spring-loaded device was used to perform biopsy of the mass in the right breast at 12 o'clock retroareolar using a lateral to medial approach. At the conclusion of the procedure a  ribbon shaped tissue marker clip was deployed into the biopsy cavity. Follow up 2 view mammogram was performed and dictated separately.   IMPRESSION: Ultrasound guided biopsy of the mass in the right breast at 12 o'clock retroareolar. No apparent complications.     Electronically Signed   By: Alger Infield M.D.   On: 06/04/2023 08:44  FINAL MICROSCOPIC DIAGNOSIS:   A. BREAST, RIGHT 12 OC, NEEDLE CORE BIOPSY:  - Intraductal papilloma with usual ductal hyperplasia, see comment   Assessment and Plan: Assessment and Plan Assessment & Plan      Kelli Thompson is a 58 y.o. female with *** -*** -*** -Follow up ***  All questions were answered to the satisfaction of the patient and family***.  The risk and benefits of *** were discussed including but not limited to ***.  After careful consideration, Tremaine CYMONE YESKE has decided to ***.    Awilda Bogus 07/02/2023, 1:43 PM

## 2023-07-03 NOTE — Consult Note (Signed)
 [facility] History and Physical  Reason for Referral: Intraductal papilloma right breast  Referring Physician: [facility]   Chief Complaint   Follow-up     Kelli Thompson is a 58 y.o. female.  HPI:   Discussed the use of AI scribe software for clinical note transcription with the patient, who gave verbal consent to proceed.  History of Present Illness Kelli Thompson "Jaylene" is a 58 year old female who presents for evaluation of a new intraductal papilloma in the right breast. 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  Three years ago, she underwent an excisional biopsy of an area in her right breast, initially suspected to be a hamartoma, which was benign. This year, a mammogram revealed another area of concern in the right breast, which was biopsied and diagnosed as an intraductal papilloma with usual ductal hyperplasia, but no atypical cells were found.  She experiences tenderness in the right breast, particularly behind the nipple at the twelve o'clock position, which developed after the recent biopsy. There is no nipple discharge, and she has not noticed any lumps or bumps in the area.  Her family history includes a half-sister who had breast cancer and underwent a double mastectomy. Her mother has had breast biopsies, but no known cancer diagnosis.  She has a history of diabetes and is currently taking metformin . She follows a no-salt diet, which has improved her leg swelling and pain, allowing her to engage in activities like dancing. No recent chest pain, shortness of breath, or significant leg swelling beyond what is typical from standing all day.      Past Medical History:  Diagnosis Date   Anxiety    Arthritis    Depression    GERD (gastroesophageal reflux disease)    Hypertension    Pancreatitis    around 2013; x1 secondary to lisinopril per patient   Peri-menopause 02/28/2014   Urinary frequency  01/30/2014   Urinary incontinence, mixed 01/30/2014    Past Surgical History:  Procedure Laterality Date   ABDOMINAL HYSTERECTOMY     APPENDECTOMY     BREAST BIOPSY Right 02/20/2020   BENIGN FIBROEPITHELIAL LESION/Procedure: BREAST EXCISIONAL BIOPSY AFTER SEED LOCATION;  Surgeon: Awilda Bogus, MD;  Location: [facility];  Service: General;  Laterality: Right;   BREAST BIOPSY Right 06/04/2023   path pending   BREAST BIOPSY Right 06/04/2023   US  RT BREAST BX W LOC DEV 1ST LESION IMG BX SPEC US  GUIDE 06/04/2023 [facility]   BREAST EXCISIONAL BIOPSY Right 01/18/2020   benign hamartoma   cervical discectomy and fusion     CESAREAN SECTION     x2   CHOLECYSTECTOMY     COLONOSCOPY WITH PROPOFOL  N/A 02/02/2020   Procedure: COLONOSCOPY WITH PROPOFOL ;  Surgeon: Vinetta Greening, DO;  Location: [facility];  Service: Endoscopy;  Laterality: N/A;  8:15am   NECK SURGERY      Family History  Problem Relation Age of Onset   Hypertension Mother    Diabetes Mother        borderline   Hypertension Father    Asthma Father    Stroke Father    Diabetes Maternal Grandmother    Congestive Heart Failure Maternal Grandmother    Arthritis Maternal Grandmother    Parkinson's disease Maternal Grandfather    Cancer Paternal Grandmother    Alcohol abuse Paternal Grandfather    Other Son        stomach issues  tablet (10 mg total) by mouth daily. for blood pressure   carvedilol  12.5 MG tablet Commonly known as: COREG  TAKE 1 TABLET BY MOUTH TWICE A DAY   DULoxetine  60 MG capsule Commonly known as: CYMBALTA  TAKE ONE CAPSULE BY MOUTH DAILY WITH SUPPER   furosemide  20 MG tablet Commonly known as: LASIX  Take 1 tablet (20 mg total) by mouth daily. For swelling   meclizine  25 MG tablet Commonly known as: ANTIVERT  Take 1 tablet (25 mg total) by mouth 3 (three) times daily as needed for dizziness.   metFORMIN  500 MG 24 hr tablet Commonly known as: GLUCOPHAGE -XR Take 1 tablet (500 mg total) by mouth daily with breakfast.   potassium chloride  SA 20 MEQ tablet Commonly known as: Klor-Con  M20 TAKE 1 TABLET BY MOUTH DAILY;         ROS:  {Review of Systems:30496}  Blood pressure 137/81, pulse 64, temperature 98.1 F (36.7 C), temperature source Oral, resp. rate 16, height 5\' 5"  (1.651 m), weight 241 lb (109.3 kg), SpO2  95%.  Physical Exam   Results: CLINICAL DATA:  Screening recall for possible right breast mass.   EXAM: DIGITAL DIAGNOSTIC UNILATERAL RIGHT MAMMOGRAM WITH TOMOSYNTHESIS AND CAD; ULTRASOUND RIGHT BREAST LIMITED   TECHNIQUE: Right digital diagnostic mammography and breast tomosynthesis was performed. The images were evaluated with computer-aided detection. ; Targeted ultrasound examination of the right breast was performed   COMPARISON:  Previous exam(s).   ACR Breast Density Category b: There are scattered areas of fibroglandular density.   FINDINGS: Additional spot compression tomosynthesis images were performed the right breast. There is an oval mass versus portion of a dilated duct the retroareolar right breast measuring 0.7 cm.   Targeted ultrasound of the retroareolar right breast was performed. There is an oval circumscribed hypoechoic mass possibly related to a focally dilated duct in the right breast at 12 o'clock retroareolar measuring 0.4 x 0.4 x 0.9 cm. This is felt to correspond well with the mass seen in the retroareolar right breast at mammography. Normal lymph nodes are present in the right axilla.   IMPRESSION: Indeterminate 0.9 cm mass/focally dilated duct in the right breast at 12 o'clock retroareolar.   RECOMMENDATION: Recommend ultrasound-guided core biopsy of the mass in the right breast at 12 o'clock retroareolar.   I have discussed the findings and recommendations with the patient. If applicable, a reminder letter will be sent to the patient regarding the next appointment.   BI-RADS CATEGORY  4: Suspicious.     Electronically Signed   By: Alger Infield M.D.   On: 05/26/2023 09:57  CLINICAL DATA:  Patient presents for ultrasound-guided core biopsy of a 0.9 cm mass in the right breast at 12 o'clock retroareolar.   EXAM: ULTRASOUND GUIDED RIGHT BREAST CORE NEEDLE BIOPSY   COMPARISON:  Previous exam(s).   PROCEDURE: I met with the patient and  we discussed the procedure of ultrasound-guided biopsy, including benefits and alternatives. We discussed the high likelihood of a successful procedure. We discussed the risks of the procedure, including infection, bleeding, tissue injury, clip migration, and inadequate sampling. Informed written consent was given. The usual time-out protocol was performed immediately prior to the procedure.   Lesion quadrant: Upper inner   Using sterile technique and 1% Lidocaine  as local anesthetic, under direct ultrasound visualization, a 14 gauge spring-loaded device was used to perform biopsy of the mass in the right breast at 12 o'clock retroareolar using a lateral to medial approach. At the conclusion of the procedure a  the right breast at 12 o'clock retroareolar measuring 0.4 x 0.4 x 0.9 cm. This is felt to correspond well with the mass seen in the retroareolar right breast at mammography. Normal lymph nodes are present in the right axilla.   IMPRESSION: Indeterminate 0.9 cm mass/focally dilated duct in the right breast at 12 o'clock retroareolar.   RECOMMENDATION: Recommend ultrasound-guided core biopsy of the mass in the right breast at 12 o'clock retroareolar.   I have discussed the findings and recommendations with the patient. If applicable, a reminder letter will be sent to the patient regarding the next appointment.   BI-RADS CATEGORY  4: Suspicious.     Electronically Signed   By: Alger Infield M.D.   On: 05/26/2023 09:57  CLINICAL DATA:  Patient presents for ultrasound-guided core biopsy of a 0.9 cm mass in the right breast at 12 o'clock retroareolar.   EXAM: ULTRASOUND GUIDED RIGHT BREAST CORE NEEDLE BIOPSY   COMPARISON:  Previous exam(s).   PROCEDURE: I met with the patient and we discussed the  procedure of ultrasound-guided biopsy, including benefits and alternatives. We discussed the high likelihood of a successful procedure. We discussed the risks of the procedure, including infection, bleeding, tissue injury, clip migration, and inadequate sampling. Informed written consent was given. The usual time-out protocol was performed immediately prior to the procedure.   Lesion quadrant: Upper inner   Using sterile technique and 1% Lidocaine  as local anesthetic, under direct ultrasound visualization, a 14 gauge spring-loaded device was used to perform biopsy of the mass in the right breast at 12 o'clock retroareolar using a lateral to medial approach. At the conclusion of the procedure a ribbon shaped tissue marker clip was deployed into the biopsy cavity. Follow up 2 view mammogram was performed and dictated separately.   IMPRESSION: Ultrasound guided biopsy of the mass in the right breast at 12 o'clock retroareolar. No apparent complications.     Electronically Signed   By: Alger Infield M.D.   On: 06/04/2023 08:44  FINAL MICROSCOPIC DIAGNOSIS:   A. BREAST, RIGHT 12 OC, NEEDLE CORE BIOPSY:  - Intraductal papilloma with usual ductal hyperplasia, see comment   Assessment and Plan: Assessment and Plan Assessment & Plan Intraductal papilloma of right breast Biopsy confirmed intraductal papilloma with usual ductal hyperplasia. Discussed that very low risk of any cancer in the lesion but there is a chance of harboring something like DCIS. She is at increased risk of breast cancer compared to others with  lifetime risk of 20% given her prior biopsies and sister's cancer. Surgical excision recommended.  - Schedule surgical excision using radiofrequency tag for localization. - Discussed surgical risks including bleeding, infection, and unexpected findings.     All questions were answered to the satisfaction of the patient.    Awilda Bogus 07/02/2023, 1:43 PM

## 2023-07-20 NOTE — H&P (Signed)
 [facility] History and Physical   Reason for Referral: Intraductal papilloma right breast  Referring Physician: [facility]    Chief Complaint   Follow-up        Kelli Thompson is a 58 y.o. female.  HPI:    Discussed the use of AI scribe software for clinical note transcription with the patient, who gave verbal consent to proceed.   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   Three years ago, she underwent an excisional biopsy of an area in her right breast, initially suspected to be a hamartoma, which was benign. This year, a mammogram revealed another area of concern in the right breast, which was biopsied and diagnosed as an intraductal papilloma with usual ductal hyperplasia, but no atypical cells were found.   She experiences tenderness in the right breast, particularly behind the nipple at the twelve o'clock position, which developed after the recent biopsy. There is no nipple discharge, and she has not noticed any lumps or bumps in the area.   Her family history includes a half-sister who had breast cancer and underwent a double mastectomy. Her mother has had breast biopsies, but no known cancer diagnosis.   She has a history of diabetes and is currently taking metformin . She follows a no-salt diet, which has improved her leg swelling and pain, allowing her to engage in activities like dancing. No recent chest pain, shortness of breath, or significant leg swelling beyond what is typical from standing all day.             Past Medical History:  Diagnosis Date   Anxiety     Arthritis     Depression     GERD (gastroesophageal reflux disease)     Hypertension     Pancreatitis      around 2013; x1 secondary to lisinopril per patient   Peri-menopause 02/28/2014    Urinary frequency 01/30/2014   Urinary incontinence, mixed 01/30/2014           Past Surgical History:  Procedure Laterality Date   ABDOMINAL HYSTERECTOMY       APPENDECTOMY       BREAST BIOPSY Right 02/20/2020    BENIGN FIBROEPITHELIAL LESION/Procedure: BREAST EXCISIONAL BIOPSY AFTER SEED LOCATION;  Surgeon: Kallie Manuelita BROCKS, MD;  Location: [facility];  Service: General;  Laterality: Right;   BREAST BIOPSY Right 06/04/2023    path pending   BREAST BIOPSY Right 06/04/2023    US  RT BREAST BX W LOC DEV 1ST LESION IMG BX SPEC US  GUIDE 06/04/2023 [facility]   BREAST EXCISIONAL BIOPSY Right 01/18/2020    benign hamartoma   cervical discectomy and fusion       CESAREAN SECTION        x2   CHOLECYSTECTOMY       COLONOSCOPY WITH PROPOFOL  N/A 02/02/2020    Procedure: COLONOSCOPY WITH PROPOFOL ;  Surgeon: Cindie Carlin POUR, DO;  Location: [facility];  Service: Endoscopy;  Laterality: N/A;  8:15am   NECK SURGERY               Family History  Problem Relation Age of Onset   Hypertension Mother     Diabetes Mother          borderline   Hypertension Father     Asthma  Father     Stroke Father     Diabetes Maternal Grandmother     Congestive Heart Failure Maternal Grandmother     Arthritis Maternal Grandmother     Parkinson's disease Maternal Grandfather     Cancer Paternal Grandmother     Alcohol abuse Paternal Grandfather     Other Son          stomach issues   Colon cancer Neg Hx        Social History         Tobacco Use   Smoking status: Former      Current packs/day: 0.00      Average packs/day: 0.5 packs/day for 30.0 years (15.0 ttl pk-yrs)      Types: Cigarettes      Start date: 03/28/1983      Quit date: 03/27/2013      Years since quitting: 10.2   Smokeless tobacco: Never  Vaping Use   Vaping status: Never Used  Substance Use Topics   Alcohol use: No   Drug use: No      Medications: I have reviewed the patient's current medications. Allergies as of 07/02/2023          Reactions    Ace Inhibitors      pancreatitis    Other      Any of the -prils, manly lisinopril            Medication List           Accurate as of July 02, 2023  1:43 PM. If you have any questions, ask your nurse or doctor.              amLODipine  10 MG tablet Commonly known as: NORVASC  Take 1 tablet (10 mg total) by mouth daily. for blood pressure    carvedilol  12.5 MG tablet Commonly known as: COREG  TAKE 1 TABLET BY MOUTH TWICE A DAY    DULoxetine  60 MG capsule Commonly known as: CYMBALTA  TAKE ONE CAPSULE BY MOUTH DAILY WITH SUPPER    furosemide  20 MG tablet Commonly known as: LASIX  Take 1 tablet (20 mg total) by mouth daily. For swelling    meclizine  25 MG tablet Commonly known as: ANTIVERT  Take 1 tablet (25 mg total) by mouth 3 (three) times daily as needed for dizziness.    metFORMIN  500 MG 24 hr tablet Commonly known as: GLUCOPHAGE -XR Take 1 tablet (500 mg total) by mouth daily with breakfast.    potassium chloride  SA 20 MEQ tablet Commonly known as: Klor-Con  M20 TAKE 1 TABLET BY MOUTH DAILY;               ROS:  A comprehensive review of systems was negative except for: Genitourinary: positive for frequency Musculoskeletal: positive for back pain, neck pain, and joiont pain Neurological: positive for headaches Endocrine: positive for diabetes   Blood pressure 137/81, pulse 64, temperature 98.1 F (36.7 C), temperature source Oral, resp. rate 16, height 5' 5 (1.651 m), weight 241 lb (109.3 kg), SpO2 95%.   Physical Exam GENERAL: Alert, cooperative, well developed, no acute distress HEENT: Normocephalic, normal oropharynx, moist mucous membranes CHEST: Clear to auscultation bilaterally, no wheezes, rhonchi, or crackles CARDIOVASCULAR: Normal heart rate and rhythm ABDOMEN: Soft, non-tender, non-distended, without organomegaly, normal bowel sounds EXTREMITIES: No cyanosis or edema NEUROLOGICAL: Cranial nerves grossly intact, moves all  extremities without gross motor or sensory deficit BREAST: No mass noted bilateral, no axillary adenopathy, normal nipples    Results: CLINICAL DATA:  Screening recall for possible right breast mass.   EXAM: DIGITAL DIAGNOSTIC UNILATERAL RIGHT MAMMOGRAM WITH TOMOSYNTHESIS AND CAD; ULTRASOUND RIGHT BREAST LIMITED   TECHNIQUE: Right digital diagnostic mammography and breast tomosynthesis was performed. The images were evaluated with computer-aided detection. ; Targeted ultrasound examination of the right breast was performed   COMPARISON:  Previous exam(s).   ACR Breast Density Category b: There are scattered areas of fibroglandular density.   FINDINGS: Additional spot compression tomosynthesis images were performed the right breast. There is an oval mass versus portion of a dilated duct the retroareolar right breast measuring 0.7 cm.   Targeted ultrasound of the retroareolar right breast was performed. There is an oval circumscribed hypoechoic mass possibly related to a focally dilated duct in the right breast at 12 o'clock retroareolar measuring 0.4 x 0.4 x 0.9 cm. This is felt to correspond well with the mass seen in the retroareolar right breast at mammography. Normal lymph nodes are present in the right axilla.   IMPRESSION: Indeterminate 0.9 cm mass/focally dilated duct in the right breast at 12 o'clock retroareolar.   RECOMMENDATION: Recommend ultrasound-guided core biopsy of the mass in the right breast at 12 o'clock retroareolar.   I have discussed the findings and recommendations with the patient. If applicable, a reminder letter will be sent to the patient regarding the next appointment.   BI-RADS CATEGORY  4: Suspicious.     Electronically Signed   By: Delon Music M.D.   On: 05/26/2023 09:57   CLINICAL DATA:  Patient presents for ultrasound-guided core biopsy of a 0.9 cm mass in the right breast at 12 o'clock retroareolar.   EXAM: ULTRASOUND GUIDED RIGHT  BREAST CORE NEEDLE BIOPSY   COMPARISON:  Previous exam(s).   PROCEDURE: I met with the patient and we discussed the procedure of ultrasound-guided biopsy, including benefits and alternatives. We discussed the high likelihood of a successful procedure. We discussed the risks of the procedure, including infection, bleeding, tissue injury, clip migration, and inadequate sampling. Informed written consent was given. The usual time-out protocol was performed immediately prior to the procedure.   Lesion quadrant: Upper inner   Using sterile technique and 1% Lidocaine  as local anesthetic, under direct ultrasound visualization, a 14 gauge spring-loaded device was used to perform biopsy of the mass in the right breast at 12 o'clock retroareolar using a lateral to medial approach. At the conclusion of the procedure a ribbon shaped tissue marker clip was deployed into the biopsy cavity. Follow up 2 view mammogram was performed and dictated separately.   IMPRESSION: Ultrasound guided biopsy of the mass in the right breast at 12 o'clock retroareolar. No apparent complications.     Electronically Signed   By: Delon Music M.D.   On: 06/04/2023 08:44   FINAL MICROSCOPIC DIAGNOSIS:   A. BREAST, RIGHT 12 OC, NEEDLE CORE BIOPSY:  - Intraductal papilloma with usual ductal hyperplasia, see comment    Assessment and Plan: Assessment and Plan Assessment & Plan Intraductal papilloma of right breast Biopsy confirmed intraductal papilloma with usual ductal hyperplasia. Discussed that very low risk of any cancer in the lesion but there is a chance of harboring something like DCIS. She is at increased risk of breast cancer compared to others with  lifetime risk of 20% given her prior biopsies and sister's cancer. Surgical excision recommended.  - Schedule surgical excision using radiofrequency tag for localization. - Discussed surgical risks including bleeding, infection, and unexpected findings.  All questions were answered to the satisfaction of the patient.

## 2023-07-22 ENCOUNTER — Other Ambulatory Visit (HOSPITAL_COMMUNITY): Payer: Self-pay | Admitting: General Surgery

## 2023-07-22 DIAGNOSIS — R928 Other abnormal and inconclusive findings on diagnostic imaging of breast: Secondary | ICD-10-CM

## 2023-07-24 ENCOUNTER — Other Ambulatory Visit: Payer: Self-pay | Admitting: Family Medicine

## 2023-07-24 DIAGNOSIS — E876 Hypokalemia: Secondary | ICD-10-CM

## 2023-07-28 ENCOUNTER — Encounter (HOSPITAL_COMMUNITY): Payer: Self-pay

## 2023-07-28 ENCOUNTER — Ambulatory Visit (HOSPITAL_COMMUNITY)
Admission: RE | Admit: 2023-07-28 | Discharge: 2023-07-28 | Disposition: A | Discharge status: Home / Self Care | Source: Ambulatory Visit | Attending: General Surgery | Admitting: General Surgery

## 2023-07-28 ENCOUNTER — Ambulatory Visit (HOSPITAL_COMMUNITY)
Admission: RE | Admit: 2023-07-28 | Discharge: 2023-07-28 | Disposition: A | Discharge status: Home / Self Care | Source: Ambulatory Visit | Attending: General Surgery

## 2023-07-28 ENCOUNTER — Other Ambulatory Visit (HOSPITAL_COMMUNITY): Payer: Self-pay | Admitting: General Surgery

## 2023-07-28 DIAGNOSIS — R928 Other abnormal and inconclusive findings on diagnostic imaging of breast: Secondary | ICD-10-CM | POA: Diagnosis present

## 2023-07-28 HISTORY — PX: BREAST BIOPSY: SHX20

## 2023-07-28 MED ORDER — LIDOCAINE-EPINEPHRINE (PF) 1 %-1:200000 IJ SOLN
INTRAMUSCULAR | Status: AC
  Filled 2023-07-28: qty 30

## 2023-07-28 MED ORDER — LIDOCAINE HCL (PF) 2 % IJ SOLN
INTRAMUSCULAR | Status: AC
  Filled 2023-07-28: qty 10

## 2023-07-28 MED ORDER — LIDOCAINE HCL (PF) 2 % IJ SOLN
10.0000 mL | Freq: Once | INTRAMUSCULAR | Status: AC
  Administered 2023-07-28: 10 mL

## 2023-07-28 MED ORDER — LIDOCAINE-EPINEPHRINE (PF) 1 %-1:200000 IJ SOLN
10.0000 mL | Freq: Once | INTRAMUSCULAR | Status: AC
  Administered 2023-07-28: 10 mL via INTRADERMAL

## 2023-07-28 NOTE — Progress Notes (Signed)
 PT tolerated right breast tag placement well today with NAD noted. PT verbalized understanding of discharge instructions. PT ambulated back to the mammogram area this time and given an ice pack to take home to use.

## 2023-08-03 NOTE — Patient Instructions (Signed)
 Kelli Thompson  08/03/2023     @PREFPERIOPPHARMACY @   Your procedure is scheduled on  08/05/2023.   Report to [facility] at  0600 A.M.   Call this number if you have problems the morning of surgery:  [phone]  If you experience any cold or flu symptoms such as cough, fever, chills, shortness of breath, etc. between now and your scheduled surgery, please notify us  at the above number.   Remember:        DO NOT take any medications for diabetes the morning of yor procedure.   Do not eat after midnight.   You may drink clear liquids until  0600.   Clear liquids allowed are:                    Water, Juice (No red color; non-citric and without pulp; diabetics please choose diet or no sugar options), Carbonated beverages (diabetics please choose diet or no sugar options), Clear Tea (No creamer, milk, or cream, including half & half and powdered creamer), Black Coffee Only (No creamer, milk or cream, including half & half and powdered creamer), and Clear Sports drink (No red color; diabetics please choose diet or no sugar options)    Take these medicines the morning of surgery with A SIP OF WATER                                         amlodipine , carvedilol .    Do not wear jewelry, make-up or nail polish, including gel polish,  artificial nails, or any other type of covering on natural nails (fingers and  toes).  Do not wear lotions, powders, or perfumes, or deodorant.  Do not shave 48 hours prior to surgery.  Men may shave face and neck.  Do not bring valuables to the hospital.  [facility] is not responsible for any belongings or valuables.  Contacts, dentures or bridgework may not be worn into surgery.  Leave your suitcase in the car.  After surgery it may be brought to your room.  For patients admitted to the hospital, discharge time will be determined by your treatment team.  Patients discharged the day of surgery will not be allowed to drive home and must have  someone with them for 24 hours.    Special instructions:   DO NOT smoke tobacco or vape for 24 hours before your procedure.  Please read over the following fact sheets that you were given. Coughing and Deep Breathing, Surgical Site Infection Prevention, Anesthesia Post-op Instructions, and Care and Recovery After Surgery        Breast Biopsy, Care After The following information offers guidance on how to care for yourself after your breast biopsy. Your doctor may also give you more specific instructions. If you have problems or questions, contact your doctor. What can I expect after the procedure? After a breast biopsy, it is common to have: Bruising on your breast. Breast swelling. Numbness, tingling, or pain near your biopsy site. This site is where tissue was taken out for study. Follow these instructions at home: Medicines Take over-the-counter and prescription medicines only as told by your doctor. If you were given a sedative during your procedure, do not drive or use machines until your doctor says that it is safe. A sedative is a medicine that helps you relax. Do not drink  alcohol while taking pain medicine. Ask your doctor if you should avoid driving or using machines while you are taking your medicine. Biopsy site care     Follow instructions from your doctor about how to take care of your cut from surgery (incision) or your puncture site. Make sure you: Wash your hands with soap and water for at least 20 seconds before and after you change your bandage. If you cannot use soap and water, use hand sanitizer. Change your bandage. Leave stitches or skin glue in place for at least 2 weeks. Leave tape strips alone unless you are told to take them off. You may trim the edges of the tape strips if they curl up. If you have stitches, keep them dry when you take a bath or a shower. Check your cut or puncture site every day for signs of infection. Look for: More redness, swelling,  or pain. More fluid or blood. Warmth. Pus or a bad smell. Protect the biopsy site. Do not let the site get bumped. Managing pain If told, put ice on the biopsy site. To do this: Put ice in a plastic bag. Place a towel between your skin and the bag. Leave the ice on for 20 minutes, 2-3 times a day. Take off the ice if your skin turns bright red. This is very important. If you cannot feel pain, heat, or cold, you have a greater risk of damage to the area. Activity If a cut was made in your skin to do the biopsy, avoid activities that could pull your cut open. These include: Stretching. Reaching over your head. Exercise. Sports. Lifting anything that weighs more than 3 lb (1.4 kg). Return to your normal activities when your doctor says that it is safe. General instructions Follow your normal diet. Wear a good support bra for as long as told by your doctor. Get checked for extra fluid around your lymph nodes (lymphedema) as often as told. Do not smoke or use any products that contain nicotine or tobacco. If you need help quitting, ask your doctor. Keep all follow-up visits. Contact a doctor if: You notice any of these at or near the biopsy site: More redness, swelling, or pain. More fluid or blood. Warmth. Pus or a bad smell. The site breaking open after the stitches or skin tape strips have been removed. You have a rash or a fever. Get help right away if: You have trouble breathing. You have red streaks around the biopsy site. Summary After a breast biopsy, it is common to have bruising, numbness, tingling, or pain near your biopsy site. Ask your doctor if you should avoid driving or using machines while you are taking your medicine. If you had a cut made in your skin to do the biopsy, avoid activities that may pull the cut open. Return to your normal activities when your doctor says that it is safe. Wear a good support bra for as long as told by your doctor. This information is  not intended to replace advice given to you by your health care provider. Make sure you discuss any questions you have with your health care provider. Document Revised: 11/07/2020 Document Reviewed: 11/07/2020 Elsevier Patient Education  2024 [employer].General Anesthesia, Adult, Care After The following information offers guidance on how to care for yourself after your procedure. Your health care provider may also give you more specific instructions. If you have problems or questions, contact your health care provider. What can I expect after the procedure? After  the procedure, it is common for people to: Have pain or discomfort at the IV site. Have nausea or vomiting. Have a sore throat or hoarseness. Have trouble concentrating. Feel cold or chills. Feel weak, sleepy, or tired (fatigue). Have soreness and body aches. These can affect parts of the body that were not involved in surgery. Follow these instructions at home: For the time period you were told by your health care provider:  Rest. Do not participate in activities where you could fall or become injured. Do not drive or use machinery. Do not drink alcohol. Do not take sleeping pills or medicines that cause drowsiness. Do not make important decisions or sign legal documents. Do not take care of children on your own. General instructions Drink enough fluid to keep your urine pale yellow. If you have sleep apnea, surgery and certain medicines can increase your risk for breathing problems. Follow instructions from your health care provider about wearing your sleep device: Anytime you are sleeping, including during daytime naps. While taking prescription pain medicines, sleeping medicines, or medicines that make you drowsy. Return to your normal activities as told by your health care provider. Ask your health care provider what activities are safe for you. Take over-the-counter and prescription medicines only as told by your  health care provider. Do not use any products that contain nicotine or tobacco. These products include cigarettes, chewing tobacco, and vaping devices, such as e-cigarettes. These can delay incision healing after surgery. If you need help quitting, ask your health care provider. Contact a health care provider if: You have nausea or vomiting that does not get better with medicine. You vomit every time you eat or drink. You have pain that does not get better with medicine. You cannot urinate or have bloody urine. You develop a skin rash. You have a fever. Get help right away if: You have trouble breathing. You have chest pain. You vomit blood. These symptoms may be an emergency. Get help right away. Call 911. Do not wait to see if the symptoms will go away. Do not drive yourself to the hospital. Summary After the procedure, it is common to have a sore throat, hoarseness, nausea, vomiting, or to feel weak, sleepy, or fatigue. For the time period you were told by your health care provider, do not drive or use machinery. Get help right away if you have difficulty breathing, have chest pain, or vomit blood. These symptoms may be an emergency. This information is not intended to replace advice given to you by your health care provider. Make sure you discuss any questions you have with your health care provider. Document Revised: 04/12/2021 Document Reviewed: 04/12/2021 Elsevier Patient Education  2024 Elsevier Inc.How to Use Chlorhexidine  at Home in the Shower Chlorhexidine  gluconate (CHG) is a germ-killing (antiseptic) wash that's used to clean the skin. It can get rid of the germs that normally live on the skin and can keep them away for about 24 hours. If you're having surgery, you may be told to shower with CHG at home the night before surgery. This can help lower your risk for infection. To use CHG wash in the shower, follow the steps below. Supplies needed: CHG body wash. Clean  washcloth. Clean towel. How to use CHG in the shower Follow these steps unless you're told to use CHG in a different way: Start the shower. Use your normal soap and shampoo to wash your face and hair. Turn off the shower or move out of the shower stream.  Pour CHG onto a clean washcloth. Do not use any type of brush or rough sponge. Start at your neck, washing your body down to your toes. Make sure you: Wash the part of your body where the surgery will be done for at least 1 minute. Do not scrub. Do not use CHG on your head or face unless your health care provider tells you to. If it gets into your ears or eyes, rinse them well with water. Do not wash your genitals with CHG. Wash your back and under your arms. Make sure to wash skin folds. Let the CHG sit on your skin for 1-2 minutes or as long as told. Rinse your entire body in the shower, including all body creases and folds. Turn off the shower. Dry off with a clean towel. Do not put anything on your skin afterward, such as powder, lotion, or perfume. Put on clean clothes or pajamas. If it's the night before surgery, sleep in clean sheets. General tips Use CHG only as told, and follow the instructions on the label. Use the full amount of CHG as told. This is often one bottle. Do not smoke and stay away from flames after using CHG. Your skin may feel sticky after using CHG. This is normal. The sticky feeling will go away as the CHG dries. Do not use CHG: If you have a chlorhexidine  allergy or have reacted to chlorhexidine  in the past. On open wounds or areas of skin that have broken skin, cuts, or scrapes. On babies younger than 46 months of age. Contact a health care provider if: You have questions about using CHG. Your skin gets irritated or itchy. You have a rash after using CHG. You swallow any CHG. Call your local poison control center [phone] in the [place] Your eyes itch badly, or they become very red or swollen. Your  hearing changes. You have trouble seeing. If you can't reach your provider, go to an urgent care or emergency room. Do not drive yourself. Get help right away if: You have swelling or tingling in your mouth or throat. You make high-pitched whistling sounds when you breathe, most often when you breathe out (wheeze). You have trouble breathing. These symptoms may be an emergency. Call 911 right away. Do not wait to see if the symptoms will go away. Do not drive yourself to the hospital. This information is not intended to replace advice given to you by your health care provider. Make sure you discuss any questions you have with your health care provider. Document Revised: 07/29/2022 Document Reviewed: 07/25/2021 Elsevier Patient Education  2024 Elsevier Inc.Diabetes Mellitus and Nutrition, Adult When you have diabetes, or diabetes mellitus, it is very important to have healthy eating habits because your blood sugar (glucose) levels are greatly affected by what you eat and drink. Eating healthy foods in the right amounts, at about the same times every day, can help you: Manage your blood glucose. Lower your risk of heart disease. Improve your blood pressure. Reach or maintain a healthy weight. What can affect my meal plan? Every person with diabetes is different, and each person has different needs for a meal plan. Your health care provider may recommend that you work with a dietitian to make a meal plan that is best for you. Your meal plan may vary depending on factors such as: The calories you need. The medicines you take. Your weight. Your blood glucose, blood pressure, and cholesterol levels. Your activity level. Other health conditions you have, such  as heart or kidney disease. How do carbohydrates affect me? Carbohydrates, also called carbs, affect your blood glucose level more than any other type of food. Eating carbs raises the amount of glucose in your blood. It is important to  know how many carbs you can safely have in each meal. This is different for every person. Your dietitian can help you calculate how many carbs you should have at each meal and for each snack. How does alcohol affect me? Alcohol can cause a decrease in blood glucose (hypoglycemia), especially if you use insulin or take certain diabetes medicines by mouth. Hypoglycemia can be a life-threatening condition. Symptoms of hypoglycemia, such as sleepiness, dizziness, and confusion, are similar to symptoms of having too much alcohol. Do not drink alcohol if: Your health care provider tells you not to drink. You are pregnant, may be pregnant, or are planning to become pregnant. If you drink alcohol: Limit how much you have to: 0-1 drink a day for women. 0-2 drinks a day for men. Know how much alcohol is in your drink. In the [place] one drink equals one 12 oz bottle of beer (355 mL), one 5 oz glass of wine (148 mL), or one 1 oz glass of hard liquor (44 mL). Keep yourself hydrated with water, diet soda, or unsweetened iced tea. Keep in mind that regular soda, juice, and other mixers may contain a lot of sugar and must be counted as carbs. What are tips for following this plan?  Reading food labels Start by checking the serving size on the Nutrition Facts label of packaged foods and drinks. The number of calories and the amount of carbs, fats, and other nutrients listed on the label are based on one serving of the item. Many items contain more than one serving per package. Check the total grams (g) of carbs in one serving. Check the number of grams of saturated fats and trans fats in one serving. Choose foods that have a low amount or none of these fats. Check the number of milligrams (mg) of salt (sodium) in one serving. Most people should limit total sodium intake to less than 2,300 mg per day. Always check the nutrition information of foods labeled as low-fat or nonfat. These foods may be higher in  added sugar or refined carbs and should be avoided. Talk to your dietitian to identify your daily goals for nutrients listed on the label. Shopping Avoid buying canned, pre-made, or processed foods. These foods tend to be high in fat, sodium, and added sugar. Shop around the outside edge of the grocery store. This is where you will most often find fresh fruits and vegetables, bulk grains, fresh meats, and fresh dairy products. Cooking Use low-heat cooking methods, such as baking, instead of high-heat cooking methods, such as deep frying. Cook using healthy oils, such as olive, canola, or sunflower oil. Avoid cooking with butter, cream, or high-fat meats. Meal planning Eat meals and snacks regularly, preferably at the same times every day. Avoid going long periods of time without eating. Eat foods that are high in fiber, such as fresh fruits, vegetables, beans, and whole grains. Eat 4-6 oz (112-168 g) of lean protein each day, such as lean meat, chicken, fish, eggs, or tofu. One ounce (oz) (28 g) of lean protein is equal to: 1 oz (28 g) of meat, chicken, or fish. 1 egg.  cup (62 g) of tofu. Eat some foods each day that contain healthy fats, such as avocado, nuts, seeds, and fish.  What foods should I eat? Fruits Berries. Apples. Oranges. Peaches. Apricots. Plums. Grapes. Mangoes. Papayas. Pomegranates. Kiwi. Cherries. Vegetables Leafy greens, including lettuce, spinach, kale, chard, collard greens, mustard greens, and cabbage. Beets. Cauliflower. Broccoli. Carrots. Green beans. Tomatoes. Peppers. Onions. Cucumbers. Brussels sprouts. Grains Whole grains, such as whole-wheat or whole-grain bread, crackers, tortillas, cereal, and pasta. Unsweetened oatmeal. Quinoa. Brown or wild rice. Meats and other proteins Seafood. Poultry without skin. Lean cuts of poultry and beef. Tofu. Nuts. Seeds. Dairy Low-fat or fat-free dairy products such as milk, yogurt, and cheese. The items listed above may not  be a complete list of foods and beverages you can eat and drink. Contact a dietitian for more information. What foods should I avoid? Fruits Fruits canned with syrup. Vegetables Canned vegetables. Frozen vegetables with butter or cream sauce. Grains Refined white flour and flour products such as bread, pasta, snack foods, and cereals. Avoid all processed foods. Meats and other proteins Fatty cuts of meat. Poultry with skin. Breaded or fried meats. Processed meat. Avoid saturated fats. Dairy Full-fat yogurt, cheese, or milk. Beverages Sweetened drinks, such as soda or iced tea. The items listed above may not be a complete list of foods and beverages you should avoid. Contact a dietitian for more information. Questions to ask a health care provider Do I need to meet with a certified diabetes care and education specialist? Do I need to meet with a dietitian? What number can I call if I have questions? When are the best times to check my blood glucose? Where to find more information: American Diabetes Association: diabetes.org Academy of Nutrition and Dietetics: eatright.[employer] of Diabetes and Digestive and Kidney Diseases: StageSync.si Association of Diabetes Care & Education Specialists: diabeteseducator.org Summary It is important to have healthy eating habits because your blood sugar (glucose) levels are greatly affected by what you eat and drink. It is important to use alcohol carefully. A healthy meal plan will help you manage your blood glucose and lower your risk of heart disease. Your health care provider may recommend that you work with a dietitian to make a meal plan that is best for you. This information is not intended to replace advice given to you by your health care provider. Make sure you discuss any questions you have with your health care provider. Document Revised: 08/16/2019 Document Reviewed: 08/17/2019 Elsevier Patient Education  2024 [employer].

## 2023-08-04 ENCOUNTER — Other Ambulatory Visit: Payer: Self-pay

## 2023-08-04 ENCOUNTER — Encounter (HOSPITAL_COMMUNITY): Payer: Self-pay

## 2023-08-04 ENCOUNTER — Encounter (HOSPITAL_COMMUNITY)
Admission: RE | Admit: 2023-08-04 | Discharge: 2023-08-04 | Disposition: A | Discharge status: Home / Self Care | Source: Ambulatory Visit | Attending: General Surgery | Admitting: General Surgery

## 2023-08-04 VITALS — BP 130/92 | HR 68 | Resp 18 | Ht 65.0 in | Wt 235.0 lb

## 2023-08-04 DIAGNOSIS — Z01818 Encounter for other preprocedural examination: Secondary | ICD-10-CM | POA: Diagnosis present

## 2023-08-04 DIAGNOSIS — I10 Essential (primary) hypertension: Secondary | ICD-10-CM | POA: Diagnosis not present

## 2023-08-04 DIAGNOSIS — E119 Type 2 diabetes mellitus without complications: Secondary | ICD-10-CM | POA: Insufficient documentation

## 2023-08-04 DIAGNOSIS — Z79899 Other long term (current) drug therapy: Secondary | ICD-10-CM | POA: Insufficient documentation

## 2023-08-04 HISTORY — DX: Type 2 diabetes mellitus without complications: E11.9

## 2023-08-04 LAB — BASIC METABOLIC PANEL WITH GFR
Anion gap: 9 (ref 5–15)
BUN: 19 mg/dL (ref 6–20)
CO2: 25 mmol/L (ref 22–32)
Calcium: 8.8 mg/dL — ABNORMAL LOW (ref 8.9–10.3)
Chloride: 106 mmol/L (ref 98–111)
Creatinine, Ser: 0.88 mg/dL (ref 0.44–1.00)
GFR, Estimated: >60 mL/min (ref >60)
Glucose, Bld: 129 mg/dL — ABNORMAL HIGH (ref 70–99)
Potassium: 3.1 mmol/L — ABNORMAL LOW (ref 3.5–5.1)
Sodium: 140 mmol/L (ref 135–145)

## 2023-08-04 LAB — HEMOGLOBIN A1C
Hgb A1c MFr Bld: 5.8 % — ABNORMAL HIGH (ref 4.8–5.6)
Mean Plasma Glucose: 119.76 mg/dL

## 2023-08-04 NOTE — Pre-Procedure Instructions (Signed)
 Patient in for pre-op. She is a newly diagnosed diabetic and has not had any teaching on diabetes or any diet information. Diabetic diet was printed for her and we also did a diabetic consultation to our dietician.

## 2023-08-05 ENCOUNTER — Ambulatory Visit (HOSPITAL_BASED_OUTPATIENT_CLINIC_OR_DEPARTMENT_OTHER): Payer: Self-pay | Admitting: Certified Registered Nurse Anesthetist

## 2023-08-05 ENCOUNTER — Encounter (HOSPITAL_COMMUNITY)
Admission: RE | Disposition: A | Discharge status: Home / Self Care | Payer: Self-pay | Source: Home / Self Care | Attending: General Surgery

## 2023-08-05 ENCOUNTER — Ambulatory Visit (HOSPITAL_COMMUNITY): Payer: Self-pay | Admitting: Certified Registered Nurse Anesthetist

## 2023-08-05 ENCOUNTER — Encounter (HOSPITAL_COMMUNITY): Payer: Self-pay | Admitting: General Surgery

## 2023-08-05 ENCOUNTER — Ambulatory Visit (HOSPITAL_COMMUNITY)
Admission: RE | Admit: 2023-08-05 | Discharge: 2023-08-05 | Disposition: A | Discharge status: Home / Self Care | Source: Ambulatory Visit | Attending: General Surgery | Admitting: General Surgery

## 2023-08-05 ENCOUNTER — Ambulatory Visit (HOSPITAL_COMMUNITY)
Admission: RE | Admit: 2023-08-05 | Discharge: 2023-08-05 | Disposition: A | Discharge status: Home / Self Care | Attending: General Surgery | Admitting: General Surgery

## 2023-08-05 DIAGNOSIS — I10 Essential (primary) hypertension: Secondary | ICD-10-CM | POA: Insufficient documentation

## 2023-08-05 DIAGNOSIS — D241 Benign neoplasm of right breast: Secondary | ICD-10-CM | POA: Diagnosis present

## 2023-08-05 DIAGNOSIS — K219 Gastro-esophageal reflux disease without esophagitis: Secondary | ICD-10-CM | POA: Insufficient documentation

## 2023-08-05 DIAGNOSIS — E119 Type 2 diabetes mellitus without complications: Secondary | ICD-10-CM | POA: Insufficient documentation

## 2023-08-05 DIAGNOSIS — Z6839 Body mass index (BMI) 39.0-39.9, adult: Secondary | ICD-10-CM | POA: Insufficient documentation

## 2023-08-05 DIAGNOSIS — E66813 Obesity, class 3: Secondary | ICD-10-CM | POA: Insufficient documentation

## 2023-08-05 DIAGNOSIS — Z87891 Personal history of nicotine dependence: Secondary | ICD-10-CM | POA: Insufficient documentation

## 2023-08-05 DIAGNOSIS — R928 Other abnormal and inconclusive findings on diagnostic imaging of breast: Secondary | ICD-10-CM

## 2023-08-05 HISTORY — PX: BREAST LUMPECTOMY WITH RADIO FREQUENCY LOCALIZER: SHX6897

## 2023-08-05 LAB — GLUCOSE, CAPILLARY
Glucose-Capillary: 132 mg/dL — ABNORMAL HIGH (ref 70–99)
Glucose-Capillary: 142 mg/dL — ABNORMAL HIGH (ref 70–99)

## 2023-08-05 SURGERY — BREAST LUMPECTOMY WITH RADIO FREQUENCY LOCALIZER
Anesthesia: General | Site: Breast | Laterality: Right

## 2023-08-05 MED ORDER — OXYCODONE HCL 5 MG PO TABS: 5.0000 mg | ORAL_TABLET | Freq: Once | ORAL | Status: DC | PRN

## 2023-08-05 MED ORDER — OXYCODONE HCL 5 MG PO TABS: 5.0000 mg | ORAL_TABLET | ORAL | 0 refills | Status: DC | PRN

## 2023-08-05 MED ORDER — PROPOFOL 10 MG/ML IV BOLUS
INTRAVENOUS | Status: DC | PRN
  Administered 2023-08-05: 150 mg via INTRAVENOUS

## 2023-08-05 MED ORDER — CEFAZOLIN SODIUM-DEXTROSE 2-4 GM/100ML-% IV SOLN
2.0000 g | INTRAVENOUS | Status: AC
  Administered 2023-08-05: 2 g via INTRAVENOUS
  Filled 2023-08-05: qty 100

## 2023-08-05 MED ORDER — CHLORHEXIDINE GLUCONATE 0.12 % MT SOLN
OROMUCOSAL | Status: AC
  Filled 2023-08-05: qty 15

## 2023-08-05 MED ORDER — ONDANSETRON HCL 4 MG PO TABS: 4.0000 mg | ORAL_TABLET | Freq: Three times a day (TID) | ORAL | 1 refills | Status: DC | PRN

## 2023-08-05 MED ORDER — OXYCODONE HCL 5 MG/5ML PO SOLN: 5.0000 mg | Freq: Once | ORAL | Status: DC | PRN

## 2023-08-05 MED ORDER — CHLORHEXIDINE GLUCONATE CLOTH 2 % EX PADS
6.0000 | MEDICATED_PAD | Freq: Once | CUTANEOUS | Status: AC
Start: 2023-08-05 — End: 2023-08-05
  Administered 2023-08-05: 6 via TOPICAL

## 2023-08-05 MED ORDER — ROCURONIUM BROMIDE 10 MG/ML (PF) SYRINGE
PREFILLED_SYRINGE | INTRAVENOUS | Status: AC
Start: 2023-08-05 — End: 2023-08-05
  Filled 2023-08-05: qty 10

## 2023-08-05 MED ORDER — LIDOCAINE 2% (20 MG/ML) 5 ML SYRINGE
INTRAMUSCULAR | Status: DC | PRN
  Administered 2023-08-05: 50 mg via INTRAVENOUS

## 2023-08-05 MED ORDER — PROPOFOL 10 MG/ML IV BOLUS
INTRAVENOUS | Status: AC
  Filled 2023-08-05: qty 20

## 2023-08-05 MED ORDER — ONDANSETRON HCL 4 MG/2ML IJ SOLN: 4.0000 mg | Freq: Once | INTRAMUSCULAR | Status: DC | PRN

## 2023-08-05 MED ORDER — DEXAMETHASONE SODIUM PHOSPHATE 10 MG/ML IJ SOLN
INTRAMUSCULAR | Status: DC | PRN
  Administered 2023-08-05: 5 mg via INTRAVENOUS

## 2023-08-05 MED ORDER — BUPIVACAINE HCL (PF) 0.5 % IJ SOLN
INTRAMUSCULAR | Status: DC | PRN
  Administered 2023-08-05: 30 mL

## 2023-08-05 MED ORDER — LACTATED RINGERS IV SOLN: INTRAVENOUS | Status: DC

## 2023-08-05 MED ORDER — SUGAMMADEX SODIUM 200 MG/2ML IV SOLN
INTRAVENOUS | Status: AC
  Filled 2023-08-05: qty 2

## 2023-08-05 MED ORDER — CHLORHEXIDINE GLUCONATE 0.12 % MT SOLN
15.0000 mL | Freq: Once | OROMUCOSAL | Status: AC
  Administered 2023-08-05: 15 mL via OROMUCOSAL

## 2023-08-05 MED ORDER — FENTANYL CITRATE (PF) 100 MCG/2ML IJ SOLN
INTRAMUSCULAR | Status: AC
  Filled 2023-08-05: qty 2

## 2023-08-05 MED ORDER — MIDAZOLAM HCL 2 MG/2ML IJ SOLN
INTRAMUSCULAR | Status: DC | PRN
  Administered 2023-08-05: 2 mg via INTRAVENOUS

## 2023-08-05 MED ORDER — ROCURONIUM BROMIDE 10 MG/ML (PF) SYRINGE
PREFILLED_SYRINGE | INTRAVENOUS | Status: DC | PRN
  Administered 2023-08-05: 50 mg via INTRAVENOUS

## 2023-08-05 MED ORDER — PHENYLEPHRINE 80 MCG/ML (10ML) SYRINGE FOR IV PUSH (FOR BLOOD PRESSURE SUPPORT)
PREFILLED_SYRINGE | INTRAVENOUS | Status: AC
  Filled 2023-08-05: qty 10

## 2023-08-05 MED ORDER — ORAL CARE MOUTH RINSE: 15.0000 mL | Freq: Once | OROMUCOSAL | Status: AC

## 2023-08-05 MED ORDER — DEXAMETHASONE SODIUM PHOSPHATE 10 MG/ML IJ SOLN
INTRAMUSCULAR | Status: AC
  Filled 2023-08-05: qty 1

## 2023-08-05 MED ORDER — ONDANSETRON HCL 4 MG/2ML IJ SOLN
INTRAMUSCULAR | Status: DC | PRN
  Administered 2023-08-05: 4 mg via INTRAVENOUS

## 2023-08-05 MED ORDER — SUGAMMADEX SODIUM 200 MG/2ML IV SOLN
INTRAVENOUS | Status: DC | PRN
  Administered 2023-08-05: 213.2 mg via INTRAVENOUS

## 2023-08-05 MED ORDER — PHENYLEPHRINE 80 MCG/ML (10ML) SYRINGE FOR IV PUSH (FOR BLOOD PRESSURE SUPPORT)
PREFILLED_SYRINGE | INTRAVENOUS | Status: DC | PRN
  Administered 2023-08-05 (×4): 80 ug via INTRAVENOUS
  Administered 2023-08-05 (×2): 160 ug via INTRAVENOUS
  Administered 2023-08-05 (×2): 80 ug via INTRAVENOUS

## 2023-08-05 MED ORDER — LIDOCAINE 2% (20 MG/ML) 5 ML SYRINGE
INTRAMUSCULAR | Status: AC
  Filled 2023-08-05: qty 5

## 2023-08-05 MED ORDER — MIDAZOLAM HCL 2 MG/2ML IJ SOLN
INTRAMUSCULAR | Status: AC
  Filled 2023-08-05: qty 2

## 2023-08-05 MED ORDER — CHLORHEXIDINE GLUCONATE CLOTH 2 % EX PADS: 6.0000 | MEDICATED_PAD | Freq: Once | CUTANEOUS | Status: DC

## 2023-08-05 MED ORDER — BUPIVACAINE HCL (PF) 0.5 % IJ SOLN
INTRAMUSCULAR | Status: AC
  Filled 2023-08-05: qty 30

## 2023-08-05 MED ORDER — FENTANYL CITRATE (PF) 100 MCG/2ML IJ SOLN
INTRAMUSCULAR | Status: DC | PRN
  Administered 2023-08-05 (×2): 50 ug via INTRAVENOUS

## 2023-08-05 MED ORDER — ONDANSETRON HCL 4 MG/2ML IJ SOLN
INTRAMUSCULAR | Status: AC
  Filled 2023-08-05: qty 2

## 2023-08-05 MED ORDER — LIDOCAINE 2% (20 MG/ML) 5 ML SYRINGE
INTRAMUSCULAR | Status: AC
Start: 2023-08-05 — End: 2023-08-05
  Filled 2023-08-05: qty 5

## 2023-08-05 MED ORDER — SODIUM CHLORIDE 0.9 % IR SOLN
Status: DC | PRN
  Administered 2023-08-05: 1000 mL

## 2023-08-05 MED ORDER — FENTANYL CITRATE PF 50 MCG/ML IJ SOSY: 25.0000 ug | PREFILLED_SYRINGE | INTRAMUSCULAR | Status: DC | PRN

## 2023-08-05 SURGICAL SUPPLY — 29 items
BLADE SURG 15 STRL LF DISP TIS (BLADE) ×1 IMPLANT
CHLORAPREP W/TINT 26 (MISCELLANEOUS) ×1 IMPLANT
CLOTH BEACON ORANGE TIMEOUT ST (SAFETY) ×1 IMPLANT
COVER LIGHT HANDLE STERIS (MISCELLANEOUS) ×2 IMPLANT
DERMABOND ADVANCED .7 DNX12 (GAUZE/BANDAGES/DRESSINGS) ×1 IMPLANT
DEVICE DUBIN SPECIMEN MAMMOGRA (MISCELLANEOUS) ×1 IMPLANT
ELECTRODE REM PT RTRN 9FT ADLT (ELECTROSURGICAL) ×1 IMPLANT
GLOVE BIO SURGEON STRL SZ 6.5 (GLOVE) ×1 IMPLANT
GLOVE BIO SURGEON STRL SZ7 (GLOVE) IMPLANT
GLOVE BIOGEL PI IND STRL 6.5 (GLOVE) ×1 IMPLANT
GLOVE BIOGEL PI IND STRL 7.0 (GLOVE) ×2 IMPLANT
GOWN STRL REUS W/TWL LRG LVL3 (GOWN DISPOSABLE) ×3 IMPLANT
KIT MARKER MARGIN INK (KITS) IMPLANT
KIT TURNOVER KIT A (KITS) ×1 IMPLANT
MANIFOLD NEPTUNE II (INSTRUMENTS) ×1 IMPLANT
NDL HYPO 18GX1.5 BLUNT FILL (NEEDLE) ×1 IMPLANT
NDL HYPO 25X1 1.5 SAFETY (NEEDLE) ×1 IMPLANT
NEEDLE HYPO 18GX1.5 BLUNT FILL (NEEDLE) ×1 IMPLANT
NEEDLE HYPO 25X1 1.5 SAFETY (NEEDLE) ×1 IMPLANT
NS IRRIG 1000ML POUR BTL (IV SOLUTION) ×1 IMPLANT
PACK MINOR (CUSTOM PROCEDURE TRAY) ×1 IMPLANT
PAD ARMBOARD POSITIONER FOAM (MISCELLANEOUS) ×1 IMPLANT
POSITIONER HEAD 8X9X4 ADT (SOFTGOODS) ×1 IMPLANT
SET BASIN LINEN APH (SET/KITS/TRAYS/PACK) ×1 IMPLANT
SET LOCALIZER 20 PROBE US (MISCELLANEOUS) ×1 IMPLANT
SUT MNCRL AB 4-0 PS2 18 (SUTURE) ×1 IMPLANT
SUT SILK 2 0 SH (SUTURE) ×1 IMPLANT
SUT VIC AB 3-0 SH 27X BRD (SUTURE) ×1 IMPLANT
SYR 30ML LL (SYRINGE) ×1 IMPLANT

## 2023-08-05 NOTE — Anesthesia Procedure Notes (Signed)
 Procedure Name: Intubation Date/Time: 08/05/2023 7:39 AM  Performed by: Harrold Macintosh, CRNAPre-anesthesia Checklist: Patient identified, Emergency Drugs available, Suction available and Patient being monitored Patient Re-evaluated:Patient Re-evaluated prior to induction Oxygen Delivery Method: Circle system utilized Preoxygenation: Pre-oxygenation with 100% oxygen Induction Type: IV induction Ventilation: Two handed mask ventilation required Laryngoscope Size: Miller and 2 Grade View: Grade I Tube type: Oral Tube size: 7.0 mm Number of attempts: 1 Airway Equipment and Method: Stylet Placement Confirmation: ETT inserted through vocal cords under direct vision, positive ETCO2 and breath sounds checked- equal and bilateral Secured at: 21 cm Tube secured with: Tape Dental Injury: Teeth and Oropharynx as per pre-operative assessment

## 2023-08-05 NOTE — Anesthesia Preprocedure Evaluation (Signed)
 Anesthesia Evaluation  Patient identified by MRN, date of birth, ID band Patient awake    Reviewed: Allergy & Precautions, H&P , NPO status , Patient's Chart, lab work & pertinent test results, reviewed documented beta blocker date and time   Airway Mallampati: II  TM Distance: >3 FB Neck ROM: full    Dental no notable dental hx.    Pulmonary neg pulmonary ROS, former smoker   Pulmonary exam normal breath sounds clear to auscultation       Cardiovascular Exercise Tolerance: Good hypertension,  Rhythm:regular Rate:Normal     Neuro/Psych  PSYCHIATRIC DISORDERS Anxiety Depression    negative neurological ROS     GI/Hepatic Neg liver ROS,GERD  ,,  Endo/Other  diabetes, Type 2  Class 3 obesity  Renal/GU negative Renal ROS  negative genitourinary   Musculoskeletal   Abdominal   Peds  Hematology negative hematology ROS (+)   Anesthesia Other Findings   Reproductive/Obstetrics negative OB ROS                              Anesthesia Physical Anesthesia Plan  ASA: 2  Anesthesia Plan: General and General LMA   Post-op Pain Management:    Induction:   PONV Risk Score and Plan: Ondansetron   Airway Management Planned:   Additional Equipment:   Intra-op Plan:   Post-operative Plan:   Informed Consent: I have reviewed the patients History and Physical, chart, labs and discussed the procedure including the risks, benefits and alternatives for the proposed anesthesia with the patient or authorized representative who has indicated his/her understanding and acceptance.     Dental Advisory Given  Plan Discussed with: CRNA  Anesthesia Plan Comments:         Anesthesia Quick Evaluation

## 2023-08-05 NOTE — Transfer of Care (Signed)
 Immediate Anesthesia Transfer of Care Note  Patient: Kelli Thompson  Procedure(s) Performed: BREAST LUMPECTOMY WITH RADIO FREQUENCY LOCALIZER (Right: Breast)  Patient Location: PACU  Anesthesia Type:General  Level of Consciousness: awake, alert , and oriented  Airway & Oxygen Therapy: Patient Spontanous Breathing and Patient connected to face mask oxygen  Post-op Assessment: Report given to RN, Post -op Vital signs reviewed and stable, Patient moving all extremities X 4, and Patient able to stick tongue midline  Post vital signs: Reviewed and stable  Last Vitals:  Vitals Value Taken Time  BP 127/84 08/05/23 08:41  Temp 98.0   Pulse 57 08/05/23 08:44  Resp 19 08/05/23 08:44  SpO2 100 % 08/05/23 08:44  Vitals shown include unfiled device data.  Last Pain:  Vitals:   08/05/23 0645  PainSc: 0-No pain         Complications: No notable events documented.

## 2023-08-05 NOTE — Interval H&P Note (Signed)
 History and Physical Interval Note:  08/05/2023 7:20 AM  Kelli Thompson  has presented today for surgery, with the diagnosis of INTRADUCTAL PAPILLOMA BREAST, RIGHT.  The various methods of treatment have been discussed with the patient and family. After consideration of risks, benefits and other options for treatment, the patient has consented to  Procedure(s): BREAST LUMPECTOMY WITH RADIO FREQUENCY LOCALIZER (Right) as a surgical intervention.  The patient's history has been reviewed, patient examined, no change in status, stable for surgery.  I have reviewed the patient's chart and labs.  Questions were answered to the patient's satisfaction.     [name]

## 2023-08-05 NOTE — Op Note (Signed)
 [facility] Operative Note  08/05/23  Preoperative Diagnosis: Right breast intraductal papilloma   Postoperative Diagnosis: Same   Procedure(s) Performed: Partial mastectomy right breast after radiofrequency tag placement    Surgeon: Manuelita BROCKS. Kallie, MD   Assistants: No qualified resident was available    Anesthesia: General endotracheal   Anesthesiologist: Kendell Yvonna PARAS, MD    Specimens: Right breast tissue (painted), medial contiguous margin tacked up with silk suture    Estimated Blood Loss: Minimal   Blood Replacement: None    Complications: None   Wound Class: Clean    Operative Indications: Ms. Golob is a 58 yo who has a intraductal papilloma with hyperplasia and recommendation for surgical excision. We discussed excision and risk of bleeding, infection, finding cancer, needing the radiofrequency tag.    Findings: Tag and clip in the specimen mammogram    Procedure: The patient was taken to the operating room and placed supine. General endotracheal anesthesia was induced. Intravenous antibiotics were  administered per protocol.  The right breast was prepped and draped in the usual sterile fashion.   Using the Hologic probe I determined the location of the incision on the lateral edge of the areolar complex. I carried this down and made skin flaps. Using the probe and cautery, I excised the breast tissue around the clip and the radiofrequency tag. On the medial inferior side, I felt like the tag was getting to close to the margin at 7-54mm, and opted to take additional tissue which was contiguous inferiorly, and a tacked the margin back to the medial edge with a silk suture. Paint was performed.  The mammogram of the specimen verified both the tag and clip. The cavity was hemostatic. Marcaine  was injected. I left the cavity open to help with cosmesis and seroma formation. I closed the breast flaps with 3-0 Vicyrl suture and the skin with 4-0 Monocryl  suture and dermabond.   Final inspection revealed acceptable hemostasis. All counts were correct at the end of the case. The patient was awakened from anesthesia and extubated without complication.  The patient went to the PACU in stable condition.   Manuelita Kallie, MD [facility] [address] [place], [place] [address] [phone] (office)

## 2023-08-05 NOTE — Progress Notes (Signed)
 [facility]  Updated grandson and called her husband. Will send Rx to [facility] on [address]. Will see a few weeks. Pathology will likely be back later in week or next week. The patient can call the office or I will see if office can call her with the pathology. I am out next week.  Manuelita Pander, MD [facility] [address] [place] [address], [place] [address] [phone] (office)

## 2023-08-05 NOTE — Anesthesia Postprocedure Evaluation (Signed)
 Anesthesia Post Note  Patient: Kelli Thompson  Procedure(s) Performed: BREAST LUMPECTOMY WITH RADIO FREQUENCY LOCALIZER (Right: Breast)  Patient location during evaluation: Phase II Anesthesia Type: General Level of consciousness: awake Pain management: pain level controlled Vital Signs Assessment: post-procedure vital signs reviewed and stable Respiratory status: spontaneous breathing and respiratory function stable Cardiovascular status: blood pressure returned to baseline and stable Postop Assessment: no headache and no apparent nausea or vomiting Anesthetic complications: no Comments: Late entry   No notable events documented.   Last Vitals:  Vitals:   08/05/23 0930 08/05/23 0943  BP: 128/88 120/80  Pulse: 69 66  Resp: 14 16  Temp:  36.5 C  SpO2: 97% 96%    Last Pain:  Vitals:   08/05/23 0920  PainSc: 2                  Yvonna JINNY Bosworth

## 2023-08-05 NOTE — Interval H&P Note (Signed)
 History and Physical Interval Note:  08/05/2023 7:21 AM  Kelli Thompson  has presented today for surgery, with the diagnosis of INTRADUCTAL PAPILLOMA BREAST, RIGHT.  The various methods of treatment have been discussed with the patient and family. After consideration of risks, benefits and other options for treatment, the patient has consented to  Procedure(s): BREAST LUMPECTOMY WITH RADIO FREQUENCY LOCALIZER (Right) as a surgical intervention.  The patient's history has been reviewed, patient examined, no change in status, stable for surgery.  I have reviewed the patient's chart and labs.  Questions were answered to the patient's satisfaction.     Kelli Thompson

## 2023-08-05 NOTE — Discharge Instructions (Signed)
 Discharge instructions after breast surgery:   Common Complaints: Pain and bruising at the incision sites.  Swelling at the incision sites.  Diet/ Activity: Diet as tolerated.  You may shower but do not take hot showers as this can disrupt the glue. Rest and listen to your body, but do not remain in bed all day.  Walk everyday for at least 15-20 minutes. Deep cough and move around every 1-2 hours in the first few days after surgery.  Do not lift > 10 lbs for the first 2 weeks after surgery. Do not do anything that makes you feel like you are putting unnecessary pull or stretch on the incision sites.  Do move your arm and shoulder (see exercises options below). If you do not move then you can get stiff and hurt more.  Do not pick at the dermabond glue on your incision sites.  This glue film will remain in place for 1-2 weeks and will start to peel off.  Do not place lotions or balms on your incision unless instructed to specifically by Dr. Kallie.   Pain Expectations and Narcotics: -After surgery you will have pain associated with your incisions and this is normal. The pain is muscular and nerve pain, and will get better with time. -You are encouraged and expected to take non narcotic medications like tylenol  and ibuprofen  (when able) to treat pain as multiple modalities can aid with pain treatment. -Narcotics are only used when pain is severe or there is breakthrough pain. -You are not expected to have a pain score of 0 after surgery, as we cannot prevent pain. A pain score of 3-4 that allows you to be functional, move, walk, and tolerate some activity is the goal. The pain will continue to improve over the days after surgery and is dependent on your surgery. -Due to [place] law, we are only able to give a certain amount of pain medication to treat post operative pain, and we only give additional narcotics on a patient by patient basis.  -For most laparoscopic surgery, studies have shown that the  majority of patients only need 10-15 narcotic pills, and for open surgeries most patients only need 15-20.   -Having appropriate expectations of pain and knowledge of pain management with non narcotics is important as we do not want anyone to become addicted to narcotic pain medication.  -Using ice packs in the first 48 hours and heating pads after 48 hours, wearing an abdominal binder (when recommended), and using over the counter medications are all ways to help with pain management.   -Simple acts like meditation and mindfulness practices after surgery can also help with pain control and research has proven the benefit of these practices.  Medication: Take tylenol  and ibuprofen  as needed for pain control, alternating every 4-6 hours.  Example:  Tylenol  1000mg  @ 6am, 12noon, 6pm, (Do not exceed 4000mg  of tylenol  a day). Ibuprofen  800mg  @ 9am, 3pm, 9pm, 3am (Do not exceed 3600mg  of ibuprofen  a day).  Take Roxicodone  for breakthrough pain every 4 hours.  Take Colace for constipation related to narcotic pain medication. If you do not have a bowel movement in 2 days, take Miralax  over the counter.  Drink plenty of water to also prevent constipation.   Contact Information: If you have questions or concerns, please call our office, [phone], Monday- Thursday 8AM-5PM and Friday 8AM-12Noon.  If it is after hours or on the weekend, please call [facility], [phone], [phone] and ask to speak to  the surgeon on call for Dr. Kallie at [facility].   Exercises After Breast Surgery Do at least a few of the exercises below twice a day. It is ok to start the day after surgery and gradually build up the amount and type of exercises you do. Link to the exercises with pictures ([email or web address].com.au).   Deep Breathing Exercise Deep breathing can help you relax and ease discomfort and tightness around your incision  (surgical cut). It's also a very good way to relieve stress during the day.  Sit comfortably in a chair. Take a slow, deep breath through your nose. Let your chest and belly expand. Breathe out slowly through your mouth. Repeat as many times as needed.  Arm and Shoulder Exercises Doing arm and shoulder exercises will help you get back your full range of motion on your affected side (the side where you had your surgery). With full range of motion, you'll be able to: Move your arm over your head and out to the side Move your arm behind your neck Move your arm to the middle of your back Do each of the exercises below 5 times a day. Keep doing this until you have a full range of motion again and can use your arm as you did before surgery in all your normal activities. This includes activities at work, at home, and in recreation or sports. If you had limited movement in your arm before surgery, your goal will be to get back as much movement as you had before.  If you get your full range of motion back quickly, keep doing these exercises once a day instead of 5 times a day. This is especially true if you feel any tightness in your chest, shoulder, or under your affected arm. These exercises can help keep scar tissue from forming in your armpit and shoulder. Scar tissue can limit your arm movements later.  If you still have trouble moving your shoulder 4 weeks after your surgery, tell your surgeon. They'll tell you if you need more rehabilitation, such as physical or occupational therapy.  If you had one of the following surgeries, you can do the following set of exercises on the first day after your surgery, as long as your surgeon tells you it's safe.  Shoulder rolls The shoulder roll is a good exercise to start with because it gently stretches your chest and shoulder muscles.  Stand or sit comfortably with your arms relaxed at your sides. Start with backward shoulder rolls. In a circular motion,  bring your shoulders forward, up, backward, and down. Do this 10 times. Switch directions and do 10 forward shoulder rolls. Bring your shoulders backward, up, forward, and down. Do this 10 times. Try to make the circles as big as you can and move both shoulders at the same time. If you have some tightness across your incision or chest, start with smaller circles and make them bigger as the tightness decreases. The backward direction might feel a little tighter across your chest than the forward direction. This will get better with practice.  Shoulder wings The shoulder wings exercise will help you get back outward movement of your shoulder. You can do this exercise while sitting or standing.  Place your hands on your chest or collarbone. Raise your elbows out to the side, limiting your range of motion as instructed by your healthcare team. Slowly lower your elbows. Do this 10 times. Then, slowly lower your hands. If you feel discomfort while doing this exercise,  hold your position and do the deep breathing exercise. If the discomfort passes, raise your elbows a little higher. If it doesn't pass, don't raise your elbows any higher. Finish the exercise raising your elbows only high enough to feel a gentle stretch and no discomfort.  Arm circles If you had surgery on both breasts, do this exercise with both arms, 1 arm at a time. Don't do this exercise with both arms at the same time. This will put too much pressure on your chest.  Stand with your feet slightly apart for balance. Raise your affected arm out to the side as high as you can, limiting your range of movement as instructed by your healthcare team. Start making slow, backward circles in the air with your arm. Make sure you're moving your arm from your shoulder, not your elbow. Keep your elbow straight. Increase the size of the circles until they're as big as you can comfortably make them, limiting your range of motion as instructed by your  healthcare team. If you feel any aching or if your arm is tired, take a break. Keep doing the exercise when you feel better. Do 10 full backward circles. Then, slowly lower your arm to your side. Rest your arm for a moment. Follow steps 1 to 4 again, but this time make slow, forward circles.  W exercise You can do the W exercise while sitting or standing.  Form a "W" with your arms out to the side and palms facing forward (see Figure 4). Try to bring your hands up so they're even with your face. If you can't raise your arms that high, bring them to the highest comfortable position. Make sure to limit your range of motion as instructed by your healthcare team. Pinch your shoulder blades together and downward, as if you're squeezing a pencil between them. If you feel discomfort, stop at that position and do the deep breathing exercise. If the discomfort passes, try to bring your arms back a little further. If it doesn't pass, don't reach any further. Hold the furthest position that doesn't cause discomfort. Squeeze your shoulder blades together and downward for 5 seconds. Slowly bring your arms back down to the starting position. Repeat this movement 10 times.  Back Climb You can do the back climb stretch while sitting or standing. You'll need a timer or stopwatch.  Place your hands behind your back. Hold the hand on your affected side with your other hand. If you had surgery on both breasts, use the arm that moves most easily to hold the other. Slowly slide your hands up the center of your back as far as you can. If you feel tightness near your incision, stop at that position and do the deep breathing exercise. If the tightness decreases, try to slide your hands up a little further. If it doesn't decrease, don't slide your hands up any further. Hold the highest position you can for 1 minute. Use your stopwatch or timer to keep track. You should feel a gentle stretch in your shoulder area. After 1  minute, slowly lower your hands.  Hands behind neck You can do the hands behind neck stretch while sitting or standing. You'll need a timer or stopwatch.  Clasp your hands together on your lap or in front of you. Slowly raise your hands toward your head, keeping your elbows together in front of you, not out to the sides. Keep your head level. Don't bend your neck or head forward. Slide your hands  over your head until you reach the back of your neck. When you get to this point, spread your elbows out to the sides. Hold this position for 1 minute. Use your stopwatch or timer to keep track. Breathe normally. Don't hold your breath as you stretch your body. If you have some tightness across your incision or chest, hold your position and do the deep breathing exercise. If the tightness decreases, continue with the movement. If the tightness stays the same, reach up and stretch your elbows back as best as you can without causing discomfort. Hold the position you're most comfortable in for 1 minute. Slowly come out of the stretch by bringing your elbows together and sliding your hands over your head. Then, slowly lower your arms.  Forward wall crawls You'll need 2 pieces of tape for the forward wall crawl exercise.  Stand facing a wall. Your toes should be about 6 inches (15 centimeters) from the wall. Reach as high as you can with your unaffected arm. Oneil that point with a piece of tape. This will be the goal for your affected arm. If you had surgery on both breasts, set your goal using the arm that moves most comfortably. Place both hands against the wall at a level that's comfortable. Crawl your fingers up the wall as far as you can, keeping them even with each other.. Try not to look up toward your hands or arch your back. When you get to the point where you feel a good stretch, but not pain, do the deep breathing exercise. Return to the starting position by crawling your fingers back down the  wall. Repeat the wall crawl 10 times. Each time you raise your hands, try to crawl a little bit higher. On the 10th crawl, use the other piece of tape to mark the highest point you reached with your affected arm. This will let you to see your progress each time you do this exercise. As you become more flexible, you may need to take a step closer to the wall so you can reach a little higher.   Side wall crawls You'll also need 2 pieces of tape for the side wall crawl exercise.  You shouldn't feel pain while doing this exercise. It's normal to feel some tightness or pulling across the side of your chest. Focus on your breathing until the tightness decreases. Breathe normally throughout this exercise. Don't hold your breath.  Be careful not to turn your body toward the wall while doing this exercise. Make sure only the side of your body faces the wall.  If you had surgery on both breasts, start with step 3.  Stand with your unaffected side closest to the wall, about 1 foot (30.5 centimeters) away from the wall. Reach as high as you can with your unaffected arm. Oneil that point with a piece of tape (see Figure 8). This will be the goal for your affected arm. Turn your body so your affected side is now closest to the wall. If you had surgery on both breasts, start with either side closest to the wall. Crawl your fingers up the wall as far as you can. When you get to the point where you feel a good stretch, but not pain, do the deep breathing exercise. Return to the starting position by crawling your fingers back down the wall. Repeat this exercise 10 times. On your 10th crawl, use a piece of tape to mark the highest point you reached with your affected arm.  This will let you see your progress each time you do the exercise. If you had surgery on both breasts, repeat the exercise with your other arm.

## 2023-08-06 ENCOUNTER — Encounter (HOSPITAL_COMMUNITY): Payer: Self-pay | Admitting: General Surgery

## 2023-08-07 LAB — SURGICAL PATHOLOGY

## 2023-08-10 ENCOUNTER — Telehealth: Payer: Self-pay | Admitting: *Deleted

## 2023-08-10 NOTE — Telephone Encounter (Signed)
 Received results from surgical pathology.   FINAL MICROSCOPIC DIAGNOSIS:   A. BREAST, RIGHT, EXCISION:  - Intraductal papilloma, approximately 1.6 cm  - Negative for atypia or carcinoma  - Biopsy site changes   Dr. Oneil Budge reviewed and reports no malignancy.   Call placed to patient and patient made aware.

## 2023-08-17 ENCOUNTER — Ambulatory Visit: Payer: Self-pay | Admitting: General Surgery

## 2023-08-17 NOTE — Progress Notes (Signed)
 Let patient know the pathology was benign, intraductal pappilloma.

## 2023-08-27 ENCOUNTER — Encounter: Payer: Self-pay | Admitting: General Surgery

## 2023-08-27 ENCOUNTER — Ambulatory Visit (INDEPENDENT_AMBULATORY_CARE_PROVIDER_SITE_OTHER): Admitting: General Surgery

## 2023-08-27 VITALS — BP 141/97 | HR 89 | Temp 98.2°F | Resp 16 | Ht 65.0 in | Wt 238.0 lb

## 2023-08-27 DIAGNOSIS — D241 Benign neoplasm of right breast: Secondary | ICD-10-CM

## 2023-08-27 NOTE — Patient Instructions (Signed)
 Call with questions or concerns. Activity as tolerated.

## 2023-08-27 NOTE — Progress Notes (Signed)
 [facility]  Doing well. Minor burning and tingling at times.   BP (!) 141/97   Pulse 89   Temp 98.2 F (36.8 C) (Oral)   Resp 16   Ht 5' 5 (1.651 m)   Wt 238 lb (108 kg)   SpO2 93%   BMI 39.61 kg/m  Right breast with periareolar incision c/d/I without erythema or drainage, minor induration  Patient s/p lumpectomy. Doing well.    Call with questions or concerns. Activity as tolerated.  Manuelita Pander, MD [facility] [address] [place], [place] [address] [phone] (office)

## 2023-08-30 ENCOUNTER — Other Ambulatory Visit: Payer: Self-pay | Admitting: Family Medicine

## 2023-08-30 DIAGNOSIS — E876 Hypokalemia: Secondary | ICD-10-CM

## 2023-08-31 NOTE — Telephone Encounter (Signed)
 Stacks NTBS for 3 mos FU 30-d given

## 2023-09-15 ENCOUNTER — Ambulatory Visit: Admitting: Family Medicine

## 2023-09-21 ENCOUNTER — Encounter: Payer: Self-pay | Admitting: Family Medicine

## 2023-09-21 ENCOUNTER — Ambulatory Visit: Payer: Self-pay | Admitting: Family Medicine

## 2023-09-21 ENCOUNTER — Ambulatory Visit (INDEPENDENT_AMBULATORY_CARE_PROVIDER_SITE_OTHER): Admitting: Family Medicine

## 2023-09-21 VITALS — BP 139/88 | HR 82 | Temp 97.5°F | Ht 65.0 in | Wt 236.0 lb

## 2023-09-21 DIAGNOSIS — E119 Type 2 diabetes mellitus without complications: Secondary | ICD-10-CM | POA: Diagnosis not present

## 2023-09-21 DIAGNOSIS — I10 Essential (primary) hypertension: Secondary | ICD-10-CM | POA: Diagnosis not present

## 2023-09-21 DIAGNOSIS — Z7984 Long term (current) use of oral hypoglycemic drugs: Secondary | ICD-10-CM

## 2023-09-21 DIAGNOSIS — H6123 Impacted cerumen, bilateral: Secondary | ICD-10-CM | POA: Diagnosis not present

## 2023-09-21 DIAGNOSIS — E876 Hypokalemia: Secondary | ICD-10-CM | POA: Diagnosis not present

## 2023-09-21 LAB — BAYER DCA HB A1C WAIVED: HB A1C (BAYER DCA - WAIVED): 5.9 % — ABNORMAL HIGH (ref 4.8–5.6)

## 2023-09-21 MED ORDER — POTASSIUM CHLORIDE CRYS ER 20 MEQ PO TBCR: 20.0000 meq | EXTENDED_RELEASE_TABLET | Freq: Every day | ORAL | 2 refills | Status: DC

## 2023-09-21 MED ORDER — AMLODIPINE BESYLATE 10 MG PO TABS
10.0000 mg | ORAL_TABLET | Freq: Every day | ORAL | 3 refills | Status: AC
Start: 2023-09-21 — End: ?

## 2023-09-21 MED ORDER — METFORMIN HCL ER 500 MG PO TB24: 500.0000 mg | ORAL_TABLET | Freq: Every day | ORAL | 3 refills | Status: DC

## 2023-09-21 NOTE — Progress Notes (Addendum)
 Subjective:  Patient ID: Kelli Thompson, female    DOB: 02-16-1965  Age: 58 y.o. MRN: [id number]  CC: 3 MONTH FOLLOW UP and HEAD COLD   HPI    History of Present Illness   Patient returns for follow-up on her blood sugar as a newly diagnosed diabetic.  She continues to take metformin .  She denies any lows or highs.  She has not had polyuria or polydipsia.  She has been checking her blood sugar regularly and brings in those readings they are reviewed on her monitor showing the fasting to be primarily in the 120-140 range and the postprandial to be in the 120-160 range.   follow-up of hypertension. Patient has no history of headache chest pain or shortness of breath or recent cough. Patient also denies symptoms of TIA such as numbness weakness lateralizing. Patient checks  blood pressure at home and has not had any elevated readings recently. Patient denies side effects from his medication. States taking it regularly.   She has been taking Lasix  for edema and notes that her potassium has been low.  She has been taking potassium regularly and wonders if she needs to continue that medication.     09/21/2023    8:23 AM 06/01/2023   11:30 AM 04/28/2023   11:40 AM  Depression screen PHQ 2/9  Decreased Interest 0 0 1  Down, Depressed, Hopeless 0 0 1  PHQ - 2 Score 0 0 2  Altered sleeping  3 3  Tired, decreased energy  1 2  Change in appetite  0 2  Feeling bad or failure about yourself   0 0  Trouble concentrating  0 0  Moving slowly or fidgety/restless  0 0  Suicidal thoughts  0 0  PHQ-9 Score  4 9  Difficult doing work/chores  Not difficult at all Somewhat difficult    History Vern has a past medical history of Anxiety, Arthritis, Depression, Diabetes mellitus without complication (HCC), GERD (gastroesophageal reflux disease), Hypertension, Pancreatitis, Peri-menopause (02/28/2014), Urinary frequency (01/30/2014), and Urinary incontinence, mixed (01/30/2014).   She has a past  surgical history that includes Cesarean section; Cholecystectomy; Appendectomy; Abdominal hysterectomy; Colonoscopy with propofol  (N/A, 02/02/2020); Breast biopsy (Right, 02/20/2020); cervical discectomy and fusion; Neck surgery; Breast excisional biopsy (Right, 01/18/2020); Breast biopsy (Right, 06/04/2023); Breast biopsy (Right, 06/04/2023); Breast biopsy (Right, 07/28/2023); and Breast lumpectomy with radio frequency localizer (Right, 08/05/2023).   Her family history includes Alcohol abuse in her paternal grandfather; Arthritis in her maternal grandmother; Asthma in her father; Cancer in her paternal grandmother; Congestive Heart Failure in her maternal grandmother; Diabetes in her maternal grandmother and mother; Hypertension in her father and mother; Other in her son; Parkinson's disease in her maternal grandfather; Stroke in her father.She reports that she quit smoking about 10 years ago. Her smoking use included cigarettes. She started smoking about 40 years ago. She has a 15 pack-year smoking history. She has never used smokeless tobacco. She reports that she does not drink alcohol and does not use drugs.    ROS Review of Systems  Constitutional: Negative.   HENT:  Positive for ear pain (Primarily a feeling of fullness and discomfort.  There is no pain.  Slight muffled hearing).   Eyes:  Negative for visual disturbance.  Respiratory:  Negative for shortness of breath.   Cardiovascular:  Negative for chest pain.  Gastrointestinal:  Negative for abdominal pain.  Musculoskeletal:  Negative for arthralgias.    Objective:  BP 139/88   Pulse 82  Temp (!) 97.5 F (36.4 C)   Ht 5' 5 (1.651 m)   Wt 236 lb (107 kg)   SpO2 95%   BMI 39.27 kg/m   BP Readings from Last 3 Encounters:  09/21/23 139/88  08/27/23 (!) 141/97  08/05/23 120/80    Wt Readings from Last 3 Encounters:  09/21/23 236 lb (107 kg)  08/27/23 238 lb (108 kg)  08/04/23 235 lb (106.6 kg)     Physical  Exam Constitutional:      General: She is not in acute distress.    Appearance: She is well-developed.  HENT:     Right Ear: There is impacted cerumen.     Left Ear: There is impacted cerumen.  Cardiovascular:     Rate and Rhythm: Normal rate and regular rhythm.  Pulmonary:     Breath sounds: Normal breath sounds.  Musculoskeletal:        General: Normal range of motion.  Skin:    General: Skin is warm and dry.  Neurological:     Mental Status: She is alert and oriented to person, place, and time.      Assessment & Plan:  New onset type 2 diabetes mellitus (HCC) -     Bayer DCA Hb A1c Waived  Essential hypertension -     CBC with Differential/Platelet -     CMP14+EGFR -     Lipid panel -     amLODIPine  Besylate; Take 1 tablet (10 mg total) by mouth daily. for blood pressure  Dispense: 90 tablet; Refill: 3  Hypokalemia -     Potassium Chloride  Crys ER; Take 1 tablet (20 mEq total) by mouth daily.  Dispense: 30 tablet; Refill: 2  Bilateral impacted cerumen  Other orders -     metFORMIN  HCl ER; Take 1 tablet (500 mg total) by mouth daily with breakfast. **NEEDS TO BE SEEN BEFORE NEXT REFILL**  Dispense: 90 tablet; Refill: 3   Impaction removed using warm water syringeand symptoms cleared. Assessment & Plan . Assessment and Plan Assessment & Plan      Follow-up: Return in about 6 weeks (around 11/02/2023).  Butler Der, M.D.

## 2023-09-21 NOTE — Patient Instructions (Signed)

## 2023-09-22 LAB — CMP14+EGFR
ALT: 21 IU/L (ref 0–32)
AST: 13 IU/L (ref 0–40)
Albumin: 4.2 g/dL (ref 3.8–4.9)
Alkaline Phosphatase: 105 IU/L (ref 44–121)
BUN/Creatinine Ratio: 17 (ref 9–23)
BUN: 19 mg/dL (ref 6–24)
Bilirubin Total: 0.3 mg/dL (ref 0.0–1.2)
CO2: 23 mmol/L (ref 20–29)
Calcium: 9.4 mg/dL (ref 8.7–10.2)
Chloride: 102 mmol/L (ref 96–106)
Creatinine, Ser: 1.14 mg/dL — ABNORMAL HIGH (ref 0.57–1.00)
Globulin, Total: 2.8 g/dL (ref 1.5–4.5)
Glucose: 156 mg/dL — ABNORMAL HIGH (ref 70–99)
Potassium: 3.6 mmol/L (ref 3.5–5.2)
Sodium: 142 mmol/L (ref 134–144)
Total Protein: 7 g/dL (ref 6.0–8.5)
eGFR: 56 mL/min/1.73 — ABNORMAL LOW (ref >59)

## 2023-09-22 LAB — LIPID PANEL
Chol/HDL Ratio: 5.5 ratio — ABNORMAL HIGH (ref 0.0–4.4)
Cholesterol, Total: 149 mg/dL (ref 100–199)
HDL: 27 mg/dL — ABNORMAL LOW (ref >39)
LDL Chol Calc (NIH): 92 mg/dL (ref 0–99)
Triglycerides: 169 mg/dL — ABNORMAL HIGH (ref 0–149)
VLDL Cholesterol Cal: 30 mg/dL (ref 5–40)

## 2023-09-22 LAB — CBC WITH DIFFERENTIAL/PLATELET
Basophils Absolute: 0 x10E3/uL (ref 0.0–0.2)
Basos: 1 %
EOS (ABSOLUTE): 0.3 x10E3/uL (ref 0.0–0.4)
Eos: 5 %
Hematocrit: 40.3 % (ref 34.0–46.6)
Hemoglobin: 12.7 g/dL (ref 11.1–15.9)
Immature Grans (Abs): 0 x10E3/uL (ref 0.0–0.1)
Immature Granulocytes: 0 %
Lymphocytes Absolute: 1.5 x10E3/uL (ref 0.7–3.1)
Lymphs: 28 %
MCH: 27.2 pg (ref 26.6–33.0)
MCHC: 31.5 g/dL (ref 31.5–35.7)
MCV: 86 fL (ref 79–97)
Monocytes Absolute: 0.7 x10E3/uL (ref 0.1–0.9)
Monocytes: 13 %
Neutrophils Absolute: 2.8 x10E3/uL (ref 1.4–7.0)
Neutrophils: 53 %
Platelets: 197 x10E3/uL (ref 150–450)
RBC: 4.67 x10E6/uL (ref 3.77–5.28)
RDW: 14.8 % (ref 11.7–15.4)
WBC: 5.3 x10E3/uL (ref 3.4–10.8)

## 2023-09-22 NOTE — Progress Notes (Signed)
Hello Makaria,  Your lab result is normal and/or stable.Some minor variations that are not significant are commonly marked abnormal, but do not represent any medical problem for you.  Best regards, Claretta Fraise, M.D.

## 2023-10-20 ENCOUNTER — Other Ambulatory Visit: Payer: Self-pay | Admitting: Family Medicine

## 2023-11-04 ENCOUNTER — Encounter: Payer: Self-pay | Admitting: Family Medicine

## 2023-11-04 ENCOUNTER — Ambulatory Visit (INDEPENDENT_AMBULATORY_CARE_PROVIDER_SITE_OTHER): Admitting: Family Medicine

## 2023-11-04 VITALS — BP 134/85 | HR 76 | Temp 97.8°F | Ht 65.0 in | Wt 240.8 lb

## 2023-11-04 DIAGNOSIS — Z7984 Long term (current) use of oral hypoglycemic drugs: Secondary | ICD-10-CM

## 2023-11-04 DIAGNOSIS — E119 Type 2 diabetes mellitus without complications: Secondary | ICD-10-CM | POA: Diagnosis not present

## 2023-11-04 MED ORDER — METFORMIN HCL ER 500 MG PO TB24: 500.0000 mg | ORAL_TABLET | Freq: Two times a day (BID) | ORAL | 3 refills | Status: AC

## 2023-11-04 NOTE — Progress Notes (Signed)
 Subjective:  Patient ID: Kelli Thompson, female    DOB: 1966/01/17  Age: 58 y.o. MRN: [id number]  CC: Medical Management of Chronic Issues   HPI  Discussed the use of AI scribe software for clinical note transcription with the patient, who gave verbal consent to proceed.  History of Present Illness Kelli Thompson is a 58 year old female with diabetes who presents for diabetes management.  Her blood sugar levels in the morning typically range from 140-150 mg/dL, with occasional fluctuations to 135 mg/dL or 839 mg/dL. Postprandial glucose levels can rise to 172 mg/dL, 805 mg/dL, and even 790 mg/dL after consuming high-carbohydrate foods like pasta and pizza, especially during events such as football games. She experiences nausea and feels unwell when her blood sugar is elevated.  She is currently taking metformin  750 mg once daily for diabetes management.  She has noticed recent weight gain. She is in the process of moving to a larger home, which she hopes will provide more space for physical activity, including using her elliptical machine. Currently, she takes her children to the park for physical activity but feels it is insufficient.  Her social history includes attending high school football games to support her granddaughter, who is in the band. She is in the process of moving from a small home to a larger double-wide home, which she anticipates will be completed by May.          11/04/2023    9:01 AM 09/21/2023    8:23 AM 06/01/2023   11:30 AM  Depression screen PHQ 2/9  Decreased Interest 0 0 0  Down, Depressed, Hopeless 0 0 0  PHQ - 2 Score 0 0 0  Altered sleeping   3  Tired, decreased energy   1  Change in appetite   0  Feeling bad or failure about yourself    0  Trouble concentrating   0  Moving slowly or fidgety/restless   0  Suicidal thoughts   0  PHQ-9 Score   4  Difficult doing work/chores   Not difficult at all    History Kelli Thompson has a past  medical history of Anxiety, Arthritis, Depression, Diabetes mellitus without complication (HCC), GERD (gastroesophageal reflux disease), Hypertension, Pancreatitis, Peri-menopause (02/28/2014), Urinary frequency (01/30/2014), and Urinary incontinence, mixed (01/30/2014).   She has a past surgical history that includes Cesarean section; Cholecystectomy; Appendectomy; Abdominal hysterectomy; Colonoscopy with propofol  (N/A, 02/02/2020); Breast biopsy (Right, 02/20/2020); cervical discectomy and fusion; Neck surgery; Breast excisional biopsy (Right, 01/18/2020); Breast biopsy (Right, 06/04/2023); Breast biopsy (Right, 06/04/2023); Breast biopsy (Right, 07/28/2023); and Breast lumpectomy with radio frequency localizer (Right, 08/05/2023).   Her family history includes Alcohol abuse in her paternal grandfather; Arthritis in her maternal grandmother; Asthma in her father; Cancer in her paternal grandmother; Congestive Heart Failure in her maternal grandmother; Diabetes in her maternal grandmother and mother; Hypertension in her father and mother; Other in her son; Parkinson's disease in her maternal grandfather; Stroke in her father.She reports that she quit smoking about 10 years ago. Her smoking use included cigarettes. She started smoking about 40 years ago. She has a 15 pack-year smoking history. She has never used smokeless tobacco. She reports that she does not drink alcohol and does not use drugs.    ROS Review of Systems  Constitutional: Negative.   HENT: Negative.    Eyes:  Negative for visual disturbance.  Respiratory:  Negative for shortness of breath.   Cardiovascular:  Negative for chest pain.  Gastrointestinal:  Negative for abdominal pain.  Musculoskeletal:  Negative for arthralgias.    Objective:  BP 134/85   Pulse 76   Temp 97.8 F (36.6 C)   Ht 5' 5 (1.651 m)   Wt 240 lb 12.8 oz (109.2 kg)   SpO2 97%   BMI 40.07 kg/m   BP Readings from Last 3 Encounters:  11/04/23 134/85   09/21/23 139/88  08/27/23 (!) 141/97    Wt Readings from Last 3 Encounters:  11/04/23 240 lb 12.8 oz (109.2 kg)  09/21/23 236 lb (107 kg)  08/27/23 238 lb (108 kg)     Physical Exam Constitutional:      General: She is not in acute distress.    Appearance: She is well-developed.  Cardiovascular:     Rate and Rhythm: Normal rate and regular rhythm.  Pulmonary:     Breath sounds: Normal breath sounds.  Musculoskeletal:        General: Normal range of motion.  Skin:    General: Skin is warm and dry.  Neurological:     Mental Status: She is alert and oriented to person, place, and time.    Physical Exam GENERAL: Alert, cooperative, well developed, no acute distress HEENT: Normocephalic, normal oropharynx, moist mucous membranes CHEST: Clear to auscultation bilaterally, No wheezes, rhonchi, or crackles CARDIOVASCULAR: Normal heart rate and rhythm, S1 and S2 normal without murmurs ABDOMEN: Soft, non-tender, non-distended, without organomegaly, Normal bowel sounds EXTREMITIES: No cyanosis or edema NEUROLOGICAL: Cranial nerves grossly intact, Moves all extremities without gross motor or sensory deficit   Assessment & Plan:  Diabetes type 2, controlled (HCC) -     Microalbumin / creatinine urine ratio  Other orders -     metFORMIN  HCl ER; Take 1 tablet (500 mg total) by mouth 2 (two) times daily with a meal.  Dispense: 180 tablet; Refill: 3    Assessment and Plan Assessment & Plan Type 2 diabetes mellitus   Type 2 diabetes mellitus with morning blood glucose levels mostly between 140-150 mg/dL, occasionally reaching 160 mg/dL. Postprandial glucose levels are generally under 160 mg/dL, with occasional spikes to 172 mg/dL, 805 mg/dL, and 790 mg/dL after high-carbohydrate meals. Recent weight gain is noted. A1c previously improved but remains above target for new onset diabetes. Increase metformin  to 750 mg twice daily. Send prescription for metformin  750 mg twice daily.  Schedule follow-up appointment in six weeks around December 22, 2023.       Follow-up: Return in about 6 weeks (around 12/16/2023) for diabetes.  Butler Der, M.D.

## 2023-11-05 LAB — MICROALBUMIN / CREATININE URINE RATIO
Creatinine, Urine: 170.6 mg/dL
Microalb/Creat Ratio: 18 mg/g{creat} (ref 0–29)
Microalbumin, Urine: 29.9 ug/mL

## 2023-11-11 ENCOUNTER — Encounter (INDEPENDENT_AMBULATORY_CARE_PROVIDER_SITE_OTHER): Payer: Self-pay | Admitting: Gastroenterology

## 2023-11-12 ENCOUNTER — Other Ambulatory Visit: Payer: Self-pay | Admitting: Family Medicine

## 2023-11-12 DIAGNOSIS — F32A Depression, unspecified: Secondary | ICD-10-CM

## 2023-12-16 ENCOUNTER — Ambulatory Visit: Payer: Self-pay | Admitting: Family Medicine

## 2023-12-16 ENCOUNTER — Telehealth: Payer: Self-pay

## 2023-12-16 NOTE — Telephone Encounter (Signed)
 Left message on voicemail to reschedule today's appointment because it is too early for diabetic check. Will not be covered by insurance.

## 2024-01-03 ENCOUNTER — Other Ambulatory Visit: Payer: Self-pay | Admitting: Family Medicine

## 2024-01-03 DIAGNOSIS — E876 Hypokalemia: Secondary | ICD-10-CM

## 2024-01-04 ENCOUNTER — Encounter: Payer: Self-pay | Admitting: Family Medicine

## 2024-01-04 ENCOUNTER — Ambulatory Visit: Admitting: Family Medicine

## 2024-01-04 VITALS — BP 126/83 | HR 74 | Temp 97.2°F | Ht 65.0 in | Wt 242.0 lb

## 2024-01-04 DIAGNOSIS — E119 Type 2 diabetes mellitus without complications: Secondary | ICD-10-CM

## 2024-01-04 DIAGNOSIS — F32A Depression, unspecified: Secondary | ICD-10-CM

## 2024-01-04 DIAGNOSIS — E876 Hypokalemia: Secondary | ICD-10-CM

## 2024-01-04 DIAGNOSIS — I10 Essential (primary) hypertension: Secondary | ICD-10-CM

## 2024-01-04 LAB — LIPID PANEL

## 2024-01-04 LAB — BAYER DCA HB A1C WAIVED: HB A1C (BAYER DCA - WAIVED): 5.7 % — ABNORMAL HIGH (ref 4.8–5.6)

## 2024-01-04 MED ORDER — POTASSIUM CHLORIDE CRYS ER 20 MEQ PO TBCR: 20.0000 meq | EXTENDED_RELEASE_TABLET | Freq: Every day | ORAL | 2 refills | Status: AC

## 2024-01-04 MED ORDER — FUROSEMIDE 20 MG PO TABS: 20.0000 mg | ORAL_TABLET | Freq: Every day | ORAL | 0 refills | Status: DC

## 2024-01-04 MED ORDER — MECLIZINE HCL 25 MG PO TABS: 25.0000 mg | ORAL_TABLET | Freq: Three times a day (TID) | ORAL | 1 refills | Status: AC | PRN

## 2024-01-04 MED ORDER — DULOXETINE HCL 60 MG PO CPEP: ORAL_CAPSULE | ORAL | 0 refills | Status: DC

## 2024-01-04 MED ORDER — CARVEDILOL 12.5 MG PO TABS: ORAL_TABLET | ORAL | 1 refills | Status: AC

## 2024-01-04 NOTE — Progress Notes (Signed)
 Subjective:  Patient ID: Kelli Thompson, female    DOB: 09-28-1965  Age: 58 y.o. MRN: [id number]  CC: Medical Management of Chronic Issues   HPI  Discussed the use of AI scribe software for clinical note transcription with the patient, who gave verbal consent to proceed.  History of Present Illness Kelli Thompson is a 58 year old female with diabetes who presents for a regular checkup.  She has experienced fluctuations in her blood glucose levels, with recent readings increasing from 130-150 mg/dL to 849-819 mg/dL since late October. A significant spike occurred after consuming fettuccine alfredo, which resulted in feeling unwell. She is currently taking metformin  twice daily.  She experiences occasional swelling and has been wearing socks and drinking more water. She has adjusted her beverage choices to half and half tea or unsweetened tea and avoids adding sugar to her coffee. She also opts for zero-calorie sodas but consumes them infrequently.  She reports feeling 'amazing.' She has recently engaged in physical activities such as walking around stores and moving boxes.  She is in the process of moving out of her house in preparation for demolition and construction of a new home. She will be staying in a hotel while waiting for the new home to be ready.     presents for  follow-up of hypertension. Patient has no history of headache chest pain or shortness of breath or recent cough. Patient also denies symptoms of TIA such as focal numbness or weakness. Patient denies side effects from medication. States taking it regularly.       01/04/2024   11:09 AM 11/04/2023    9:01 AM 09/21/2023    8:23 AM  Depression screen PHQ 2/9  Decreased Interest 0 0 0  Down, Depressed, Hopeless 0 0 0  PHQ - 2 Score 0 0 0  Altered sleeping 0    Tired, decreased energy 1    Change in appetite 0    Feeling bad or failure about yourself  0    Trouble concentrating 0    Moving slowly or  fidgety/restless 0    Suicidal thoughts 0    PHQ-9 Score 1    Difficult doing work/chores Not difficult at all      History Kelli Thompson has a past medical history of Anxiety, Arthritis, Depression, Diabetes mellitus without complication (HCC), GERD (gastroesophageal reflux disease), Hypertension, Pancreatitis, Peri-menopause (02/28/2014), Urinary frequency (01/30/2014), and Urinary incontinence, mixed (01/30/2014).   She has a past surgical history that includes Cesarean section; Cholecystectomy; Appendectomy; Abdominal hysterectomy; Colonoscopy with propofol  (N/A, 02/02/2020); Breast biopsy (Right, 02/20/2020); cervical discectomy and fusion; Neck surgery; Breast excisional biopsy (Right, 01/18/2020); Breast biopsy (Right, 06/04/2023); Breast biopsy (Right, 06/04/2023); Breast biopsy (Right, 07/28/2023); and Breast lumpectomy with radio frequency localizer (Right, 08/05/2023).   Her family history includes Alcohol abuse in her paternal grandfather; Arthritis in her maternal grandmother; Asthma in her father; Cancer in her paternal grandmother; Congestive Heart Failure in her maternal grandmother; Diabetes in her maternal grandmother and mother; Hypertension in her father and mother; Other in her son; Parkinson's disease in her maternal grandfather; Stroke in her father.She reports that she quit smoking about 10 years ago. Her smoking use included cigarettes. She started smoking about 40 years ago. She has a 15 pack-year smoking history. She has never used smokeless tobacco. She reports that she does not drink alcohol and does not use drugs.    ROS Review of Systems  Objective:  BP 126/83   Pulse 74  Temp (!) 97.2 F (36.2 C)   Ht 5' 5 (1.651 m)   Wt 242 lb (109.8 kg)   SpO2 97%   BMI 40.27 kg/m   BP Readings from Last 3 Encounters:  01/04/24 126/83  11/04/23 134/85  09/21/23 139/88    Wt Readings from Last 3 Encounters:  01/04/24 242 lb (109.8 kg)  11/04/23 240 lb 12.8 oz (109.2 kg)   09/21/23 236 lb (107 kg)     Physical Exam Physical Exam GENERAL: Alert, cooperative, well developed, no acute distress HEENT: Normocephalic, normal oropharynx, moist mucous membranes CHEST: Clear to auscultation bilaterally, no wheezes, rhonchi, or crackles CARDIOVASCULAR: Normal heart rate and rhythm, S1 and S2 normal without murmurs ABDOMEN: Soft, non-tender, non-distended, without organomegaly, normal bowel sounds EXTREMITIES: No cyanosis or edema NEUROLOGICAL: Cranial nerves grossly intact, moves all extremities without gross motor or sensory deficit   Assessment & Plan:  Diabetes type 2, controlled (HCC) -     Bayer DCA Hb A1c Waived -     Comprehensive metabolic panel with GFR -     Lipid panel -     Microalbumin / creatinine urine ratio  Hypokalemia -     Comprehensive metabolic panel with GFR  Depression, unspecified depression type  Essential hypertension    Assessment and Plan Assessment & Plan Type 2 diabetes mellitus   Blood glucose levels have increased from 130-150 mg/dL to 849-819 mg/dL, likely due to dietary choices such as concentrated carbohydrates. A1c remains in the prediabetic range at 5.8%. Continue Metformin  750 mg oral once daily. Checked A1c levels. Will discuss potential use of GLP-1 receptor agonist (Mounjaro) if A1c indicates need and insurance approval is obtained.  Peripheral edema   Intermittent swelling, possibly related to dietary sodium intake. No significant issues reported. Advised monitoring swelling in relation to sodium intake, particularly from sodas.  Hypokalemia   Routine management with furosemide  and potassium supplementation. Refilled furosemide  and potassium prescriptions.  Depression   Depression score is good, indicating well-managed symptoms.  Essential hypertension   Blood pressure is well-controlled.  General Health Maintenance   Routine health maintenance discussed, including blood pressure and cholesterol  management. Checked cholesterol levels.       Follow-up: Return in about 3 months (around 04/03/2024).  Butler Der, M.D.

## 2024-01-05 ENCOUNTER — Ambulatory Visit: Payer: Self-pay | Admitting: Family Medicine

## 2024-01-05 LAB — COMPREHENSIVE METABOLIC PANEL WITH GFR
ALT: 16 IU/L (ref 0–32)
AST: 16 IU/L (ref 0–40)
Albumin: 4.4 g/dL (ref 3.8–4.9)
Alkaline Phosphatase: 87 IU/L (ref 49–135)
BUN/Creatinine Ratio: 15 (ref 9–23)
BUN: 14 mg/dL (ref 6–24)
Bilirubin Total: 0.4 mg/dL (ref 0.0–1.2)
CO2: 29 mmol/L (ref 20–29)
Calcium: 9.7 mg/dL (ref 8.7–10.2)
Chloride: 101 mmol/L (ref 96–106)
Creatinine, Ser: 0.91 mg/dL (ref 0.57–1.00)
Globulin, Total: 2.6 g/dL (ref 1.5–4.5)
Glucose: 110 mg/dL — AB (ref 70–99)
Potassium: 3.7 mmol/L (ref 3.5–5.2)
Sodium: 143 mmol/L (ref 134–144)
Total Protein: 7 g/dL (ref 6.0–8.5)
eGFR: 73 mL/min/1.73 (ref >59)

## 2024-01-05 LAB — LIPID PANEL
Cholesterol, Total: 173 mg/dL (ref 100–199)
HDL: 36 mg/dL — AB (ref >39)
LDL CALC COMMENT:: 4.8 ratio — AB (ref 0.0–4.4)
LDL Chol Calc (NIH): 120 mg/dL — AB (ref 0–99)
Triglycerides: 92 mg/dL (ref 0–149)
VLDL Cholesterol Cal: 17 mg/dL (ref 5–40)

## 2024-01-05 LAB — MICROALBUMIN / CREATININE URINE RATIO
Creatinine, Urine: 79.1 mg/dL
Microalb/Creat Ratio: 42 mg/g{creat} — ABNORMAL HIGH (ref 0–29)
Microalbumin, Urine: 32.9 ug/mL

## 2024-01-05 MED ORDER — ROSUVASTATIN CALCIUM 10 MG PO TABS: 10.0000 mg | ORAL_TABLET | Freq: Every day | ORAL | 1 refills | Status: AC

## 2024-01-21 ENCOUNTER — Other Ambulatory Visit: Payer: Self-pay | Admitting: Family Medicine

## 2024-02-07 ENCOUNTER — Other Ambulatory Visit: Payer: Self-pay | Admitting: Family Medicine

## 2024-02-07 DIAGNOSIS — F32A Depression, unspecified: Secondary | ICD-10-CM

## 2024-04-04 ENCOUNTER — Ambulatory Visit: Admitting: Family Medicine
